# Patient Record
Sex: Male | Born: 1935
Health system: Southern US, Community
[De-identification: ages and names within clinical notes are randomized; demographics above are authoritative.]

## PROBLEM LIST (undated history)

## (undated) DIAGNOSIS — C259 Malignant neoplasm of pancreas, unspecified: Secondary | ICD-10-CM

## (undated) DIAGNOSIS — M109 Gout, unspecified: Secondary | ICD-10-CM

## (undated) DIAGNOSIS — M9080 Osteopathy in diseases classified elsewhere, unspecified site: Secondary | ICD-10-CM

## (undated) DIAGNOSIS — I639 Cerebral infarction, unspecified: Secondary | ICD-10-CM

## (undated) DIAGNOSIS — N189 Chronic kidney disease, unspecified: Secondary | ICD-10-CM

## (undated) DIAGNOSIS — R600 Localized edema: Secondary | ICD-10-CM

## (undated) DIAGNOSIS — D649 Anemia, unspecified: Secondary | ICD-10-CM

## (undated) DIAGNOSIS — M898X9 Other specified disorders of bone, unspecified site: Secondary | ICD-10-CM

## (undated) DIAGNOSIS — Z9889 Other specified postprocedural states: Secondary | ICD-10-CM

## (undated) DIAGNOSIS — E039 Hypothyroidism, unspecified: Secondary | ICD-10-CM

## (undated) DIAGNOSIS — G8929 Other chronic pain: Secondary | ICD-10-CM

## (undated) DIAGNOSIS — R809 Proteinuria, unspecified: Secondary | ICD-10-CM

## (undated) DIAGNOSIS — E119 Type 2 diabetes mellitus without complications: Secondary | ICD-10-CM

## (undated) DIAGNOSIS — I10 Essential (primary) hypertension: Secondary | ICD-10-CM

## (undated) DIAGNOSIS — C801 Malignant (primary) neoplasm, unspecified: Secondary | ICD-10-CM

## (undated) DIAGNOSIS — M549 Dorsalgia, unspecified: Secondary | ICD-10-CM

## (undated) DIAGNOSIS — R112 Nausea with vomiting, unspecified: Secondary | ICD-10-CM

## (undated) DIAGNOSIS — E889 Metabolic disorder, unspecified: Secondary | ICD-10-CM

## (undated) DIAGNOSIS — M1990 Unspecified osteoarthritis, unspecified site: Secondary | ICD-10-CM

## (undated) HISTORY — DX: Metabolic disorder, unspecified: E88.9

## (undated) HISTORY — DX: Unspecified osteoarthritis, unspecified site: M19.90

## (undated) HISTORY — DX: Other specified disorders of bone, unspecified site: M89.8X9

## (undated) HISTORY — DX: Malignant (primary) neoplasm, unspecified: C80.1

## (undated) HISTORY — DX: Malignant neoplasm of pancreas, unspecified: C25.9

## (undated) HISTORY — DX: Anemia, unspecified: D64.9

## (undated) HISTORY — PX: CATARACT EXTRACTION: SUR2

## (undated) HISTORY — DX: Osteopathy in diseases classified elsewhere, unspecified site: M90.80

## (undated) HISTORY — DX: Localized edema: R60.0

## (undated) HISTORY — DX: Proteinuria, unspecified: R80.9

## (undated) HISTORY — DX: Type 2 diabetes mellitus without complications: E11.9

## (undated) HISTORY — PX: COLONOSCOPY W/ BIOPSIES AND POLYPECTOMY: SHX1376

## (undated) HISTORY — DX: Essential (primary) hypertension: I10

## (undated) HISTORY — DX: Chronic kidney disease, unspecified: N18.9

---

## 2005-10-05 ENCOUNTER — Ambulatory Visit (HOSPITAL_COMMUNITY): Admission: RE | Admit: 2005-10-05 | Discharge: 2005-10-05 | Payer: Self-pay | Admitting: Neurosurgery

## 2005-10-07 ENCOUNTER — Ambulatory Visit (HOSPITAL_COMMUNITY): Admission: RE | Admit: 2005-10-07 | Discharge: 2005-10-07 | Payer: Self-pay | Admitting: Neurosurgery

## 2014-03-28 ENCOUNTER — Other Ambulatory Visit: Payer: Self-pay | Admitting: *Deleted

## 2014-03-28 ENCOUNTER — Encounter: Payer: Self-pay | Admitting: Vascular Surgery

## 2014-03-28 DIAGNOSIS — Z01810 Encounter for preprocedural cardiovascular examination: Secondary | ICD-10-CM

## 2014-03-28 DIAGNOSIS — N184 Chronic kidney disease, stage 4 (severe): Secondary | ICD-10-CM

## 2014-04-01 ENCOUNTER — Encounter: Payer: Self-pay | Admitting: Vascular Surgery

## 2014-04-02 ENCOUNTER — Ambulatory Visit (INDEPENDENT_AMBULATORY_CARE_PROVIDER_SITE_OTHER)
Admission: RE | Admit: 2014-04-02 | Discharge: 2014-04-02 | Disposition: A | Discharge status: Home / Self Care | Payer: Medicare Other | Source: Ambulatory Visit | Attending: Vascular Surgery | Admitting: Vascular Surgery

## 2014-04-02 ENCOUNTER — Other Ambulatory Visit: Payer: Self-pay

## 2014-04-02 ENCOUNTER — Ambulatory Visit (HOSPITAL_COMMUNITY)
Admission: RE | Admit: 2014-04-02 | Discharge: 2014-04-02 | Disposition: A | Discharge status: Home / Self Care | Payer: Medicare Other | Source: Ambulatory Visit | Attending: Vascular Surgery | Admitting: Vascular Surgery

## 2014-04-02 ENCOUNTER — Encounter: Payer: Self-pay | Admitting: Vascular Surgery

## 2014-04-02 ENCOUNTER — Ambulatory Visit (INDEPENDENT_AMBULATORY_CARE_PROVIDER_SITE_OTHER): Payer: Medicare Other | Admitting: Vascular Surgery

## 2014-04-02 VITALS — BP 113/61 | HR 64 | Resp 14 | Ht 69.5 in | Wt 206.0 lb

## 2014-04-02 DIAGNOSIS — Z01810 Encounter for preprocedural cardiovascular examination: Secondary | ICD-10-CM | POA: Diagnosis not present

## 2014-04-02 DIAGNOSIS — N184 Chronic kidney disease, stage 4 (severe): Secondary | ICD-10-CM | POA: Diagnosis not present

## 2014-04-02 DIAGNOSIS — N183 Chronic kidney disease, stage 3 unspecified: Secondary | ICD-10-CM | POA: Insufficient documentation

## 2014-04-02 DIAGNOSIS — N186 End stage renal disease: Secondary | ICD-10-CM

## 2014-04-02 NOTE — Progress Notes (Signed)
Subjective:     Patient ID: Cameron Phelps, male   DOB: 1919-03-25, 80 y.o.   MRN: [id number]  HPI this 80 year old male was referred by Dr.Befacadu for evaluation for vascular access. Patient is not yet on hemodialysis. He has a history of type 2 diabetes mellitus. He is right-handed. He has had a previous right brain stroke 10 years ago which resulted in some left upper extremity weakness which is partially resolved.  Past Medical History  Diagnosis Date  . Diabetes mellitus without complication   . Hypertension   . Chronic kidney disease   . Anemia   . Metabolic bone disease   . Leg edema   . Proteinuria   . Cancer     Prostate  . Arthritis     gout    History  Substance Use Topics  . Smoking status: Never Smoker   . Smokeless tobacco: Never Used  . Alcohol Use: No    Family History  Problem Relation Age of Onset  . Diabetes Mother   . Cancer Daughter   . Diabetes Daughter   . Hyperlipidemia Daughter     No Known Allergies  Current outpatient prescriptions:allopurinol (ZYLOPRIM) 100 MG tablet, Take 100 mg by mouth daily., Disp: , Rfl: ;  Calcium Carbonate (CALTRATE 600 PO), Take by mouth., Disp: , Rfl: ;  clopidogrel (PLAVIX) 75 MG tablet, Take 75 mg by mouth daily., Disp: , Rfl: ;  glipiZIDE (GLUCOTROL) 5 MG tablet, Take by mouth daily before breakfast., Disp: , Rfl:  levothyroxine (SYNTHROID, LEVOTHROID) 88 MCG tablet, Take 88 mcg by mouth daily before breakfast., Disp: , Rfl: ;  Omega-3 Fatty Acids (FISH OIL) 1000 MG CAPS, Take by mouth., Disp: , Rfl: ;  oxyCODONE-acetaminophen (PERCOCET/ROXICET) 5-325 MG per tablet, Take 1 tablet by mouth every 6 (six) hours as needed for severe pain., Disp: , Rfl: ;  pioglitazone (ACTOS) 30 MG tablet, Take 30 mg by mouth daily., Disp: , Rfl:  predniSONE (DELTASONE) 10 MG tablet, Take 10 mg by mouth daily with breakfast., Disp: , Rfl: ;  torsemide (DEMADEX) 10 MG tablet, Take 10 mg by mouth daily., Disp: , Rfl:   BP 113/61  Pulse 64   Resp 14  Ht 5' 9.5" (1.765 m)  Wt 206 lb (93.441 kg)  BMI 29.99 kg/m2  Body mass index is 29.99 kg/(m^2).          Review of Systems patient is able to ambulate. Complains of leg pain with walking and leg pain lying flat. Also has weakness in arms and legs. Previous right brain stroke with left upper extremity weakness. Has lower extremity edema. Uncontrolled type 2 diabetes mellitus. Other systems negative and a complete review of systems other than severe back pain    Objective:   Physical Exam BP 113/61  Pulse 64  Resp 14  Ht 5' 9.5" (1.765 m)  Wt 206 lb (93.441 kg)  BMI 29.99 kg/m2  Gen.-alert and oriented x3 in no apparent distress-elderly HEENT normal for age Lungs no rhonchi or wheezing Cardiovascular regular rhythm no murmurs carotid pulses 3+ palpable no bruits audible Abdomen soft nontender no palpable masses Musculoskeletal free of  major deformities Skin clear -no rashes Neurologic mild left upper extremity weakness. Able to lift against gravity. Has a weak grip. Lower extremities 3+ femoral and dorsalis pedis pulses palpable bilaterally with no edema 2-3+ radial pulses palpable bilaterally. No obvious surface veins noted.  Data ordered bilateral upper chin a vein mapping and arterial study. The arterial study was  normal in both upper extremities. No veins are suitable for fistula creation in either upper extremity particularly the left with small caliber and thickened veins throughout.        Assessment:     Patient with end-stage renal disease not yet on hemodialysis needs vascular access History of right brain stroke with left upper extremity paralysis Type 2 diabetes mellitus    Plan:     Plan left upper arm AV graft insertion by Dr. Bridgett Larsson on Monday, September 28. Discussed thoroughly with patient and his daughter and they are agreeable to proceed

## 2014-04-05 ENCOUNTER — Encounter (HOSPITAL_COMMUNITY): Payer: Self-pay

## 2014-04-05 ENCOUNTER — Other Ambulatory Visit (HOSPITAL_COMMUNITY): Payer: Medicare Other

## 2014-04-05 ENCOUNTER — Encounter (HOSPITAL_COMMUNITY): Payer: Self-pay | Admitting: *Deleted

## 2014-04-07 MED ORDER — DEXTROSE 5 % IV SOLN
1.5000 g | INTRAVENOUS | Status: AC
  Administered 2014-04-08: 1.5 g via INTRAVENOUS
  Filled 2014-04-07 (×2): qty 1.5

## 2014-04-08 ENCOUNTER — Ambulatory Visit (HOSPITAL_COMMUNITY): Payer: Medicare Other

## 2014-04-08 ENCOUNTER — Encounter (HOSPITAL_COMMUNITY): Payer: Medicare Other | Admitting: Anesthesiology

## 2014-04-08 ENCOUNTER — Ambulatory Visit (HOSPITAL_COMMUNITY): Payer: Medicare Other | Admitting: Anesthesiology

## 2014-04-08 ENCOUNTER — Encounter (HOSPITAL_COMMUNITY)
Admission: RE | Disposition: A | Discharge status: Home / Self Care | Payer: Self-pay | Source: Ambulatory Visit | Attending: Vascular Surgery

## 2014-04-08 ENCOUNTER — Encounter (HOSPITAL_COMMUNITY): Payer: Self-pay | Admitting: Certified Registered"

## 2014-04-08 ENCOUNTER — Ambulatory Visit (HOSPITAL_COMMUNITY)
Admission: RE | Admit: 2014-04-08 | Discharge: 2014-04-08 | Disposition: A | Discharge status: Home / Self Care | Payer: Medicare Other | Source: Ambulatory Visit | Attending: Vascular Surgery | Admitting: Vascular Surgery

## 2014-04-08 DIAGNOSIS — IMO0002 Reserved for concepts with insufficient information to code with codable children: Secondary | ICD-10-CM | POA: Diagnosis not present

## 2014-04-08 DIAGNOSIS — N184 Chronic kidney disease, stage 4 (severe): Secondary | ICD-10-CM

## 2014-04-08 DIAGNOSIS — M109 Gout, unspecified: Secondary | ICD-10-CM | POA: Insufficient documentation

## 2014-04-08 DIAGNOSIS — R29898 Other symptoms and signs involving the musculoskeletal system: Secondary | ICD-10-CM | POA: Diagnosis not present

## 2014-04-08 DIAGNOSIS — Z8546 Personal history of malignant neoplasm of prostate: Secondary | ICD-10-CM | POA: Diagnosis not present

## 2014-04-08 DIAGNOSIS — E1165 Type 2 diabetes mellitus with hyperglycemia: Secondary | ICD-10-CM | POA: Insufficient documentation

## 2014-04-08 DIAGNOSIS — Z79899 Other long term (current) drug therapy: Secondary | ICD-10-CM | POA: Diagnosis not present

## 2014-04-08 DIAGNOSIS — E118 Type 2 diabetes mellitus with unspecified complications: Secondary | ICD-10-CM

## 2014-04-08 DIAGNOSIS — I129 Hypertensive chronic kidney disease with stage 1 through stage 4 chronic kidney disease, or unspecified chronic kidney disease: Secondary | ICD-10-CM | POA: Diagnosis not present

## 2014-04-08 DIAGNOSIS — I69998 Other sequelae following unspecified cerebrovascular disease: Secondary | ICD-10-CM | POA: Diagnosis not present

## 2014-04-08 DIAGNOSIS — D649 Anemia, unspecified: Secondary | ICD-10-CM | POA: Diagnosis not present

## 2014-04-08 DIAGNOSIS — N186 End stage renal disease: Secondary | ICD-10-CM

## 2014-04-08 HISTORY — DX: Hypothyroidism, unspecified: E03.9

## 2014-04-08 HISTORY — DX: Cerebral infarction, unspecified: I63.9

## 2014-04-08 HISTORY — DX: Gout, unspecified: M10.9

## 2014-04-08 HISTORY — DX: Other chronic pain: G89.29

## 2014-04-08 HISTORY — DX: Dorsalgia, unspecified: M54.9

## 2014-04-08 HISTORY — PX: AV FISTULA PLACEMENT: SHX1204

## 2014-04-08 LAB — GLUCOSE, CAPILLARY: Glucose-Capillary: 248 mg/dL — ABNORMAL HIGH (ref 70–99)

## 2014-04-08 LAB — POCT I-STAT 4, (NA,K, GLUC, HGB,HCT)
GLUCOSE: 272 mg/dL — AB (ref 70–99)
HCT: 34 % — ABNORMAL LOW (ref 39.0–52.0)
Hemoglobin: 11.6 g/dL — ABNORMAL LOW (ref 13.0–17.0)
Potassium: 4.1 mEq/L (ref 3.7–5.3)
Sodium: 141 mEq/L (ref 137–147)

## 2014-04-08 SURGERY — ARTERIOVENOUS (AV) FISTULA CREATION
Anesthesia: Monitor Anesthesia Care | Site: Arm Upper | Laterality: Left

## 2014-04-08 MED ORDER — PROPOFOL 10 MG/ML IV BOLUS
INTRAVENOUS | Status: AC
  Filled 2014-04-08: qty 20

## 2014-04-08 MED ORDER — THROMBIN 20000 UNITS EX SOLR
CUTANEOUS | Status: AC
  Filled 2014-04-08: qty 20000

## 2014-04-08 MED ORDER — SODIUM CHLORIDE 0.9 % IV SOLN: INTRAVENOUS | Status: DC

## 2014-04-08 MED ORDER — FENTANYL CITRATE 0.05 MG/ML IJ SOLN: 25.0000 ug | INTRAMUSCULAR | Status: DC | PRN

## 2014-04-08 MED ORDER — OXYCODONE HCL 5 MG PO TABS: 5.0000 mg | ORAL_TABLET | ORAL | Status: DC | PRN

## 2014-04-08 MED ORDER — FENTANYL CITRATE 0.05 MG/ML IJ SOLN
INTRAMUSCULAR | Status: AC
  Filled 2014-04-08: qty 5

## 2014-04-08 MED ORDER — LIDOCAINE-EPINEPHRINE (PF) 1 %-1:200000 IJ SOLN
INTRAMUSCULAR | Status: DC | PRN
  Administered 2014-04-08: 30 mL

## 2014-04-08 MED ORDER — OXYCODONE HCL 5 MG PO TABS
ORAL_TABLET | ORAL | Status: AC
  Administered 2014-04-08: 5 mg via ORAL
  Filled 2014-04-08: qty 1

## 2014-04-08 MED ORDER — LIDOCAINE-EPINEPHRINE (PF) 1 %-1:200000 IJ SOLN
INTRAMUSCULAR | Status: AC
  Filled 2014-04-08: qty 10

## 2014-04-08 MED ORDER — 0.9 % SODIUM CHLORIDE (POUR BTL) OPTIME
TOPICAL | Status: DC | PRN
  Administered 2014-04-08: 1000 mL

## 2014-04-08 MED ORDER — PROPOFOL 10 MG/ML IV BOLUS
INTRAVENOUS | Status: DC | PRN
  Administered 2014-04-08: 30 mg via INTRAVENOUS

## 2014-04-08 MED ORDER — PROPOFOL INFUSION 10 MG/ML OPTIME
INTRAVENOUS | Status: DC | PRN
  Administered 2014-04-08: 50 ug/kg/min via INTRAVENOUS

## 2014-04-08 MED ORDER — FENTANYL CITRATE 0.05 MG/ML IJ SOLN
INTRAMUSCULAR | Status: DC | PRN
  Administered 2014-04-08: 50 ug via INTRAVENOUS

## 2014-04-08 MED ORDER — SODIUM CHLORIDE 0.9 % IV SOLN
INTRAVENOUS | Status: DC | PRN
  Administered 2014-04-08: 07:00:00 via INTRAVENOUS

## 2014-04-08 MED ORDER — OXYCODONE HCL 5 MG PO TABS
5.0000 mg | ORAL_TABLET | Freq: Once | ORAL | Status: AC
  Administered 2014-04-08: 5 mg via ORAL

## 2014-04-08 MED ORDER — CHLORHEXIDINE GLUCONATE CLOTH 2 % EX PADS: 6.0000 | MEDICATED_PAD | Freq: Once | CUTANEOUS | Status: DC

## 2014-04-08 MED ORDER — LIDOCAINE HCL (CARDIAC) 20 MG/ML IV SOLN
INTRAVENOUS | Status: DC | PRN
  Administered 2014-04-08: 40 mg via INTRAVENOUS

## 2014-04-08 MED ORDER — PHENYLEPHRINE 40 MCG/ML (10ML) SYRINGE FOR IV PUSH (FOR BLOOD PRESSURE SUPPORT)
PREFILLED_SYRINGE | INTRAVENOUS | Status: AC
  Filled 2014-04-08: qty 10

## 2014-04-08 MED ORDER — LIDOCAINE HCL (CARDIAC) 20 MG/ML IV SOLN
INTRAVENOUS | Status: AC
  Filled 2014-04-08: qty 5

## 2014-04-08 MED ORDER — PHENYLEPHRINE HCL 10 MG/ML IJ SOLN
INTRAMUSCULAR | Status: DC | PRN
  Administered 2014-04-08: 80 ug via INTRAVENOUS
  Administered 2014-04-08: 120 ug via INTRAVENOUS

## 2014-04-08 MED ORDER — ONDANSETRON HCL 4 MG/2ML IJ SOLN
INTRAMUSCULAR | Status: DC | PRN
  Administered 2014-04-08: 4 mg via INTRAVENOUS

## 2014-04-08 MED ORDER — SODIUM CHLORIDE 0.9 % IR SOLN
Status: DC | PRN
  Administered 2014-04-08: 08:00:00

## 2014-04-08 MED ORDER — ONDANSETRON HCL 4 MG/2ML IJ SOLN
INTRAMUSCULAR | Status: AC
  Filled 2014-04-08: qty 2

## 2014-04-08 MED ORDER — EPHEDRINE SULFATE 50 MG/ML IJ SOLN
INTRAMUSCULAR | Status: AC
  Filled 2014-04-08: qty 1

## 2014-04-08 MED ORDER — SODIUM CHLORIDE 0.9 % IJ SOLN
INTRAMUSCULAR | Status: AC
  Filled 2014-04-08: qty 10

## 2014-04-08 MED ORDER — BUPIVACAINE HCL (PF) 0.5 % IJ SOLN
INTRAMUSCULAR | Status: DC | PRN
  Administered 2014-04-08: 30 mL

## 2014-04-08 MED ORDER — EPHEDRINE SULFATE 50 MG/ML IJ SOLN
INTRAMUSCULAR | Status: DC | PRN
  Administered 2014-04-08 (×2): 10 mg via INTRAVENOUS

## 2014-04-08 SURGICAL SUPPLY — 39 items
ADH SKN CLS APL DERMABOND .7 (GAUZE/BANDAGES/DRESSINGS) ×2
ARMBAND PINK RESTRICT EXTREMIT (MISCELLANEOUS) ×3 IMPLANT
BLADE SURG 10 STRL SS (BLADE) ×2 IMPLANT
CANISTER SUCTION 2500CC (MISCELLANEOUS) ×3 IMPLANT
CLIP TI MEDIUM 6 (CLIP) ×3 IMPLANT
CLIP TI WIDE RED SMALL 6 (CLIP) ×3 IMPLANT
COVER PROBE W GEL 5X96 (DRAPES) ×2 IMPLANT
DECANTER SPIKE VIAL GLASS SM (MISCELLANEOUS) ×3 IMPLANT
DERMABOND ADVANCED (GAUZE/BANDAGES/DRESSINGS) ×1
DERMABOND ADVANCED .7 DNX12 (GAUZE/BANDAGES/DRESSINGS) ×2 IMPLANT
ELECT REM PT RETURN 9FT ADLT (ELECTROSURGICAL) ×3
ELECTRODE REM PT RTRN 9FT ADLT (ELECTROSURGICAL) ×2 IMPLANT
GLOVE BIO SURGEON STRL SZ7 (GLOVE) ×3 IMPLANT
GLOVE BIOGEL PI IND STRL 7.0 (GLOVE) ×1 IMPLANT
GLOVE BIOGEL PI IND STRL 7.5 (GLOVE) ×2 IMPLANT
GLOVE BIOGEL PI INDICATOR 7.0 (GLOVE) ×1
GLOVE BIOGEL PI INDICATOR 7.5 (GLOVE) ×1
GLOVE ECLIPSE 6.5 STRL STRAW (GLOVE) ×2 IMPLANT
GLOVE SURG SS PI 6.5 STRL IVOR (GLOVE) ×2 IMPLANT
GOWN STRL REUS W/ TWL LRG LVL3 (GOWN DISPOSABLE) ×5 IMPLANT
GOWN STRL REUS W/ TWL XL LVL3 (GOWN DISPOSABLE) ×1 IMPLANT
GOWN STRL REUS W/TWL LRG LVL3 (GOWN DISPOSABLE) ×6
GOWN STRL REUS W/TWL XL LVL3 (GOWN DISPOSABLE) ×3
KIT BASIN OR (CUSTOM PROCEDURE TRAY) ×3 IMPLANT
KIT ROOM TURNOVER OR (KITS) ×3 IMPLANT
NS IRRIG 1000ML POUR BTL (IV SOLUTION) ×3 IMPLANT
PACK CV ACCESS (CUSTOM PROCEDURE TRAY) ×3 IMPLANT
PAD ARMBOARD 7.5X6 YLW CONV (MISCELLANEOUS) ×6 IMPLANT
PROBE PENCIL 8 MHZ STRL DISP (MISCELLANEOUS) ×2 IMPLANT
SPONGE SURGIFOAM ABS GEL 100 (HEMOSTASIS) IMPLANT
SUT MNCRL AB 4-0 PS2 18 (SUTURE) ×3 IMPLANT
SUT PROLENE 5 0 C 1 24 (SUTURE) IMPLANT
SUT PROLENE 6 0 BV (SUTURE) ×4 IMPLANT
SUT PROLENE 7 0 BV 1 (SUTURE) ×2 IMPLANT
SUT SILK 2 0 FS (SUTURE) ×1 IMPLANT
SUT VIC AB 3-0 SH 27 (SUTURE) ×6
SUT VIC AB 3-0 SH 27X BRD (SUTURE) ×4 IMPLANT
UNDERPAD 30X30 INCONTINENT (UNDERPADS AND DIAPERS) ×3 IMPLANT
WATER STERILE IRR 1000ML POUR (IV SOLUTION) ×3 IMPLANT

## 2014-04-08 NOTE — Discharge Instructions (Addendum)

## 2014-04-08 NOTE — Transfer of Care (Signed)
Immediate Anesthesia Transfer of Care Note  Patient: Cameron Phelps  Procedure(s) Performed: Procedure(s): ARTERIOVENOUS (AV) FISTULA CREATION (Left)  Patient Location: PACU  Anesthesia Type:MAC  Level of Consciousness: awake, alert  and oriented  Airway & Oxygen Therapy: Patient Spontanous Breathing  Post-op Assessment: Report given to PACU RN  Post vital signs: Reviewed and stable  Complications: No apparent anesthesia complications

## 2014-04-08 NOTE — Anesthesia Postprocedure Evaluation (Signed)
  Anesthesia Post-op Note  Patient: Cameron Phelps  Procedure(s) Performed: Procedure(s): ARTERIOVENOUS (AV) FISTULA CREATION (Left)  Patient Location: PACU  Anesthesia Type: MAC  Level of Consciousness: awake and alert   Airway and Oxygen Therapy: Patient Spontanous Breathing  Post-op Pain: none  Post-op Assessment: Post-op Vital signs reviewed, Patient's Cardiovascular Status Stable and Respiratory Function Stable  Post-op Vital Signs: Reviewed  Filed Vitals:   04/08/14 0939  BP: 122/47  Pulse: 72  Temp:   Resp: 15    Complications: No apparent anesthesia complications

## 2014-04-08 NOTE — Op Note (Signed)
OPERATIVE NOTE   PROCEDURE: 1. left first stage basilic vein transposition (brachiobasilic arteriovenous fistula) placement  PRE-OPERATIVE DIAGNOSIS: chronic kidney disease stage IV-V   POST-OPERATIVE DIAGNOSIS: same as above   SURGEON: Adele Barthel, MD  ANESTHESIA: local and MAC  ESTIMATED BLOOD LOSS: 50 cc  FINDING(S): 1. Palpable thrill and radial pulse at end of case 2. 4 mm cubital and 4 mm brachial veins  SPECIMEN(S):  none  INDICATIONS:   Cameron Phelps is a 80 y.o. male who presents with imminent end stage renal disease.  The patient is scheduled for left arm arteriovenous graft but I discussed possibly placing a staged fistula if his veins were found to be adequate.  The patient is aware the risks include but are not limited to: bleeding, infection, steal syndrome, nerve damage, ischemic monomelic neuropathy, failure to mature, and need for additional procedures.  The patient is aware of the risks of the procedure and elects to proceed forward.  DESCRIPTION: After full informed written consent was obtained from the patient, the patient was brought back to the operating room and placed supine upon the operating table.  Prior to induction, the patient received IV antibiotics.   After obtaining adequate anesthesia, the patient was then prepped and draped in the standard fashion for a left arm access procedure.  I interrogated the left arm with the sonosite and an acceptable basilic vein was found.  I felt an attempt at basilic vein transposition was indicated.  I turned my attention first to identifying the patient's basilic vein and brachial artery.  Using SonoSite guidance, the location of these vessels were marked out on the skin.   At this point, I injected local anesthetic to obtain a field block of the antecubitum.  In total, I injected about 10 mL of a 1:1 mixture of 0.5% Marcaine without epinephrine and 1% lidocaine with epinephrine.  I made a longitudinal incision at the  level of the antecubitum and dissected through the subcutaneous tissue and fascia to gain exposure of the brachial artery.  This was noted to be 4 mm in diameter externally.  This was dissected out proximally and distally and controlled with vessel loops .  In the process of the dissecting out the brachial artery, I also found a cubital vein draining into the basilic vein system.  This was noted to be 4 mm in diameter externally.  The distal segment of the vein was ligated with a  2-0 silk, and the vein was transected.  The proximal segment was iinterrogated with serial dilators.  The vein accepted up to a 5 mm dilator without any difficulty.  I then instilled the heparinized saline into the vein and clamped it.  At this point, I reset my exposure of the brachial artery and placed the artery under tension proximally and distally.  I made an arteriotomy with a #11 blade, and then I extended the arteriotomy with a Potts scissor.  I injected heparinized saline proximal and distal to this arteriotomy.  The vein was then sewn to the artery in an end-to-side configuration with a running stitch of 7-0 Prolene.  Prior to completing this anastomosis, I allowed the vein and artery to backbleed.  There was no evidence of clot from any vessels.  I completed the anastomosis in the usual fashion and then released all vessel loops and clamps.  There was a palpable  thrill in the venous outflow, and there was a palpable radial pulse.  At this point, I irrigated out  the surgical wound.  There was no further active bleeding.  The subcutaneous tissue was reapproximated with a running stitch of 3-0 Vicryl.  The skin was then reapproximated with a running subcuticular stitch of 4-0 Vicryl.  The skin was then cleaned, dried, and reinforced with Dermabond.  The patient tolerated this procedure well.   COMPLICATIONS: none  CONDITION: stable  Adele Barthel, MD [facility] Office: [phone] Pager:  [phone]  04/08/2014, 9:01 AM

## 2014-04-08 NOTE — Interval H&P Note (Signed)
History and Physical Interval Note:  04/08/2014 7:26 AM  Cameron Phelps  has presented today for surgery, with the diagnosis of End stage renal disease  The various methods of treatment have been discussed with the patient and family. After consideration of risks, benefits and other options for treatment, the patient has consented to  Procedure(s): INSERTION OF ARTERIOVENOUS (AV) GORE-TEX GRAFT ARM (Left) as a surgical intervention .  The patient's history has been reviewed, patient examined, no change in status, stable for surgery.  I have reviewed the patient's chart and labs.  Questions were answered to the patient's satisfaction.     Ayslin Kundert LIANG-YU

## 2014-04-08 NOTE — Anesthesia Preprocedure Evaluation (Addendum)
Anesthesia Evaluation  Patient identified by MRN, date of birth, ID band Patient awake    Reviewed: Allergy & Precautions, H&P , NPO status , Patient's Chart, lab work & pertinent test results  Airway Mallampati: II TM Distance: >3 FB Neck ROM: Full    Dental no notable dental hx. (+) Upper Dentures, Lower Dentures, Dental Advisory Given   Pulmonary neg pulmonary ROS,  breath sounds clear to auscultation  Pulmonary exam normal       Cardiovascular hypertension, Rhythm:Regular Rate:Normal     Neuro/Psych CVA negative neurological ROS  negative psych ROS   GI/Hepatic negative GI ROS, Neg liver ROS,   Endo/Other  diabetes, Type 2, Oral Hypoglycemic AgentsHypothyroidism   Renal/GU ESRFRenal disease  negative genitourinary   Musculoskeletal   Abdominal   Peds  Hematology negative hematology ROS (+) anemia ,   Anesthesia Other Findings   Reproductive/Obstetrics negative OB ROS                          Anesthesia Physical Anesthesia Plan  ASA: III  Anesthesia Plan: MAC   Post-op Pain Management:    Induction: Intravenous  Airway Management Planned: Simple Face Mask  Additional Equipment:   Intra-op Plan:   Post-operative Plan:   Informed Consent: I have reviewed the patients History and Physical, chart, labs and discussed the procedure including the risks, benefits and alternatives for the proposed anesthesia with the patient or authorized representative who has indicated his/her understanding and acceptance.   Dental advisory given  Plan Discussed with: CRNA  Anesthesia Plan Comments:         Anesthesia Quick Evaluation

## 2014-04-08 NOTE — H&P (View-Only) (Signed)
Subjective:     Patient ID: Cameron Phelps, male   DOB: 1919-02-06, 80 y.o.   MRN: [id number]  HPI this 80 year old male was referred by Dr.Befacadu for evaluation for vascular access. Patient is not yet on hemodialysis. He has a history of type 2 diabetes mellitus. He is right-handed. He has had a previous right brain stroke 10 years ago which resulted in some left upper extremity weakness which is partially resolved.  Past Medical History  Diagnosis Date  . Diabetes mellitus without complication   . Hypertension   . Chronic kidney disease   . Anemia   . Metabolic bone disease   . Leg edema   . Proteinuria   . Cancer     Prostate  . Arthritis     gout    History  Substance Use Topics  . Smoking status: Never Smoker   . Smokeless tobacco: Never Used  . Alcohol Use: No    Family History  Problem Relation Age of Onset  . Diabetes Mother   . Cancer Daughter   . Diabetes Daughter   . Hyperlipidemia Daughter     No Known Allergies  Current outpatient prescriptions:allopurinol (ZYLOPRIM) 100 MG tablet, Take 100 mg by mouth daily., Disp: , Rfl: ;  Calcium Carbonate (CALTRATE 600 PO), Take by mouth., Disp: , Rfl: ;  clopidogrel (PLAVIX) 75 MG tablet, Take 75 mg by mouth daily., Disp: , Rfl: ;  glipiZIDE (GLUCOTROL) 5 MG tablet, Take by mouth daily before breakfast., Disp: , Rfl:  levothyroxine (SYNTHROID, LEVOTHROID) 88 MCG tablet, Take 88 mcg by mouth daily before breakfast., Disp: , Rfl: ;  Omega-3 Fatty Acids (FISH OIL) 1000 MG CAPS, Take by mouth., Disp: , Rfl: ;  oxyCODONE-acetaminophen (PERCOCET/ROXICET) 5-325 MG per tablet, Take 1 tablet by mouth every 6 (six) hours as needed for severe pain., Disp: , Rfl: ;  pioglitazone (ACTOS) 30 MG tablet, Take 30 mg by mouth daily., Disp: , Rfl:  predniSONE (DELTASONE) 10 MG tablet, Take 10 mg by mouth daily with breakfast., Disp: , Rfl: ;  torsemide (DEMADEX) 10 MG tablet, Take 10 mg by mouth daily., Disp: , Rfl:   BP 113/61  Pulse 64   Resp 14  Ht 5' 9.5" (1.765 m)  Wt 206 lb (93.441 kg)  BMI 29.99 kg/m2  Body mass index is 29.99 kg/(m^2).          Review of Systems patient is able to ambulate. Complains of leg pain with walking and leg pain lying flat. Also has weakness in arms and legs. Previous right brain stroke with left upper extremity weakness. Has lower extremity edema. Uncontrolled type 2 diabetes mellitus. Other systems negative and a complete review of systems other than severe back pain    Objective:   Physical Exam BP 113/61  Pulse 64  Resp 14  Ht 5' 9.5" (1.765 m)  Wt 206 lb (93.441 kg)  BMI 29.99 kg/m2  Gen.-alert and oriented x3 in no apparent distress-elderly HEENT normal for age Lungs no rhonchi or wheezing Cardiovascular regular rhythm no murmurs carotid pulses 3+ palpable no bruits audible Abdomen soft nontender no palpable masses Musculoskeletal free of  major deformities Skin clear -no rashes Neurologic mild left upper extremity weakness. Able to lift against gravity. Has a weak grip. Lower extremities 3+ femoral and dorsalis pedis pulses palpable bilaterally with no edema 2-3+ radial pulses palpable bilaterally. No obvious surface veins noted.  Data ordered bilateral upper chin a vein mapping and arterial study. The arterial study was  normal in both upper extremities. No veins are suitable for fistula creation in either upper extremity particularly the left with small caliber and thickened veins throughout.        Assessment:     Patient with end-stage renal disease not yet on hemodialysis needs vascular access History of right brain stroke with left upper extremity paralysis Type 2 diabetes mellitus    Plan:     Plan left upper arm AV graft insertion by Dr. Bridgett Larsson on Monday, September 28. Discussed thoroughly with patient and his daughter and they are agreeable to proceed

## 2014-04-08 NOTE — Anesthesia Procedure Notes (Signed)
Procedure Name: MAC Date/Time: 04/08/2014 7:41 AM Performed by: Barrington Ellison Pre-anesthesia Checklist: Patient identified, Emergency Drugs available, Suction available and Patient being monitored Patient Re-evaluated:Patient Re-evaluated prior to inductionOxygen Delivery Method: Simple face mask

## 2014-04-09 ENCOUNTER — Telehealth: Payer: Self-pay | Admitting: Vascular Surgery

## 2014-04-09 NOTE — Telephone Encounter (Signed)
Gave daughter Shauna Hugh the follow up appointment information: 05/24/14 at 3:30pm - kf

## 2014-04-10 ENCOUNTER — Telehealth: Payer: Self-pay | Admitting: *Deleted

## 2014-04-10 ENCOUNTER — Encounter (HOSPITAL_COMMUNITY): Payer: Self-pay | Admitting: Vascular Surgery

## 2014-04-10 DIAGNOSIS — Z9889 Other specified postprocedural states: Principal | ICD-10-CM

## 2014-04-10 DIAGNOSIS — R110 Nausea: Secondary | ICD-10-CM

## 2014-04-10 MED ORDER — PROMETHAZINE HCL 12.5 MG RE SUPP: 6.2500 mg | Freq: Four times a day (QID) | RECTAL | Status: DC | PRN

## 2014-04-10 NOTE — Telephone Encounter (Signed)
Daughter called to report that Mr. Dolton is having problems with extreme nausea,no vomiting since his Left arm BVT on 04-08-14 by Dr. Bridgett Larsson. He is having nausea when taking any of his meds and when he smells food. He is drinking fluids like water and cranberrry juice with no difficulty. I asked our PA, Leontine Locket, and she has rx'd Phenergan suppositiories 6.25 mg #12. I called this in to [facility]. I cautioned the daughter on watching out for sleepiness and breathing suppression issues. She will watch Mr. Trucks closely and report back to [place] if he doesn't improve. She has been in contact with his PCP, Dr. Woody Seller, regarding patient's constipation issue;  He has had chronic constipation problems.

## 2014-05-23 ENCOUNTER — Encounter: Payer: Self-pay | Admitting: Vascular Surgery

## 2014-05-24 ENCOUNTER — Ambulatory Visit (INDEPENDENT_AMBULATORY_CARE_PROVIDER_SITE_OTHER): Payer: Medicare Other | Admitting: Vascular Surgery

## 2014-05-24 ENCOUNTER — Encounter: Payer: Medicare Other | Admitting: Vascular Surgery

## 2014-05-24 ENCOUNTER — Encounter: Payer: Self-pay | Admitting: Vascular Surgery

## 2014-05-24 VITALS — BP 127/66 | HR 86 | Temp 98.2°F | Resp 16 | Ht 69.5 in | Wt 217.0 lb

## 2014-05-24 DIAGNOSIS — N183 Chronic kidney disease, stage 3 unspecified: Secondary | ICD-10-CM

## 2014-05-24 NOTE — Progress Notes (Signed)
    Postoperative Access Visit   History of Present Illness  Cameron Phelps is a 80 y.o. year old male who presents for postoperative follow-up for: L 1st stage BVT (Date: 04/08/14).  The patient's wounds are healed.  The patient notes no steal symptoms.  The patient is able to complete their activities of daily living.  The patient's current symptoms are: none.  Per the patient, his kidney function is improving and he likely will not need dialysis.  For VQI Use Only  PRE-ADM LIVING: Home  AMB STATUS: Ambulatory  Physical Examination Filed Vitals:   05/24/14 1547  BP: 127/66  Pulse: 86  Temp: 98.2 F (36.8 C)  Resp: 16    LUE: Incision is healed, skin feels warm, hand grip is 5/5, sensation in digits is intact, palpable thrill, bruit can be auscultated , on Sonosite: BVT > 6 mm throughout, palpable radial pulse  Medical Decision Making  Cameron Phelps is a 80 y.o. year old male who presents s/p successful L first stage BVT.  The L BVT is ready for transposition but fortunately it appears that he might not need it.  A 4 week window generally would be necessary to finish the transposition, so if his renal function worsens, I would arrange for the second stage.  Thank you for allowing [place] to participate in this patient's care.  Adele Barthel, MD [facility] [place] Office: [phone] Pager: [phone]  05/24/2014, 4:38 PM

## 2014-07-22 ENCOUNTER — Encounter: Payer: Self-pay | Admitting: Vascular Surgery

## 2014-07-22 ENCOUNTER — Ambulatory Visit: Payer: Medicare Other | Admitting: Surgery

## 2014-07-23 ENCOUNTER — Encounter: Payer: Self-pay | Admitting: Vascular Surgery

## 2014-07-23 ENCOUNTER — Ambulatory Visit (INDEPENDENT_AMBULATORY_CARE_PROVIDER_SITE_OTHER): Payer: Medicare Other | Admitting: Vascular Surgery

## 2014-07-23 ENCOUNTER — Other Ambulatory Visit: Payer: Self-pay

## 2014-07-23 VITALS — BP 117/50 | HR 84 | Resp 18 | Ht 69.0 in | Wt 202.0 lb

## 2014-07-23 DIAGNOSIS — N183 Chronic kidney disease, stage 3 unspecified: Secondary | ICD-10-CM

## 2014-07-23 NOTE — Progress Notes (Signed)
HISTORY AND PHYSICAL   History of Present Illness:  Patient is a 81 y.o. year old male who presents for evaluation of left AV fistula.  L 1st stage BVT (Date: 04/08/14). The patient's wounds are healed. The patient notes no steal symptoms. The patient is able to complete their activities of daily living.  He was last seen by Dr. Bridgett Larsson on 05/24/2014.  At that visit he was not on dialysis nor did they think he would be soon.  He returns today with a report from Dr. Lowanda Foster that he will need dialysis sooner than later.   Other medical problems include has CKD (chronic kidney disease), stage III on his problem list.  Past Medical History  Diagnosis Date  . Diabetes mellitus without complication   . Chronic kidney disease   . Anemia   . Metabolic bone disease   . Leg edema   . Proteinuria   . Cancer     Prostate  . Arthritis     gout  . Stroke     residual left -sided weakness  . Hypothyroidism   . Gout   . Chronic back pain   . Hypertension     Denies history of HTN    Past Surgical History  Procedure Laterality Date  . Colonoscopy w/ biopsies and polypectomy    . Cataract extraction    . Av fistula placement Left 04/08/2014    Procedure: ARTERIOVENOUS (AV) FISTULA CREATION;  Surgeon: Conrad Gregory, MD;  Location: [facility];  Service: Vascular;  Laterality: Left;    Social History History  Substance Use Topics  . Smoking status: Never Smoker   . Smokeless tobacco: Never Used  . Alcohol Use: No    Family History Family History  Problem Relation Age of Onset  . Diabetes Mother   . Cancer Daughter   . Diabetes Daughter   . Hyperlipidemia Daughter     Allergies  No Known Allergies   Current Outpatient Prescriptions  Medication Sig Dispense Refill  . Calcium Carbonate (CALTRATE 600 PO) Take 600 mg by mouth daily.     . calcium carbonate (OS-CAL) 600 MG TABS tablet Take 600 mg by mouth 2 (two) times daily with a meal.    . clopidogrel (PLAVIX) 75 MG tablet Take 75  mg by mouth daily.    Marland Kitchen glipiZIDE (GLUCOTROL) 5 MG tablet Take 5 mg by mouth 2 (two) times daily.     Marland Kitchen levothyroxine (SYNTHROID, LEVOTHROID) 88 MCG tablet Take 88 mcg by mouth daily before breakfast.    . pioglitazone (ACTOS) 30 MG tablet Take 30 mg by mouth daily.    . polyethylene glycol (MIRALAX / GLYCOLAX) packet Take 17 g by mouth daily.    . sodium bicarbonate 650 MG tablet Take 650 mg by mouth 3 (three) times daily.    Marland Kitchen torsemide (DEMADEX) 10 MG tablet Take 10 mg by mouth daily.    Marland Kitchen allopurinol (ZYLOPRIM) 100 MG tablet Take 100 mg by mouth daily.    . Omega-3 Fatty Acids (FISH OIL) 1000 MG CAPS Take 1,000 mg by mouth daily.     Marland Kitchen oxyCODONE (ROXICODONE) 5 MG immediate release tablet Take 1 tablet (5 mg total) by mouth every 4 (four) hours as needed for severe pain. (Patient not taking: Reported on 07/23/2014) 30 tablet 0  . oxyCODONE-acetaminophen (PERCOCET/ROXICET) 5-325 MG per tablet Take 1 tablet by mouth every 6 (six) hours as needed for severe pain.    . predniSONE (DELTASONE) 10 MG  tablet Take 10 mg by mouth 2 (two) times daily with a meal. For 6 days    . promethazine (PHENERGAN) 12.5 MG suppository Place 0.5 suppositories (6.25 mg total) rectally every 6 (six) hours as needed for nausea or vomiting. (Patient not taking: Reported on 07/23/2014) 12 each 0  . senna (SENOKOT) 8.6 MG TABS tablet Take 2 tablets by mouth 2 (two) times daily.     No current facility-administered medications for this visit.    ROS:   General:  No weight loss, Fever, chills  HEENT: No recent headaches, no nasal bleeding, no visual changes, no sore throat  Neurologic: No dizziness, blackouts, seizures. No recent symptoms of stroke or mini- stroke. No recent episodes of slurred speech, or temporary blindness.  Cardiac: No recent episodes of chest pain/pressure, no shortness of breath at rest.  No shortness of breath with exertion.  Denies history of atrial fibrillation or irregular heartbeat  Vascular:  No history of rest pain in feet.  No history of claudication.  No history of non-healing ulcer, No history of DVT   Pulmonary: No home oxygen, no productive cough, no hemoptysis,  No asthma or wheezing  Musculoskeletal:  [ ]  Arthritis, [ ]  Low back pain,  [ ]  Joint pain  Hematologic:No history of hypercoagulable state.  No history of easy bleeding.  No history of anemia  Gastrointestinal: No hematochezia or melena,  No gastroesophageal reflux, no trouble swallowing  Urinary: [ ]  chronic Kidney disease, [ ]  on HD - [ ]  MWF or [ ]  TTHS, [ ]  Burning with urination, [ ]  Frequent urination, [ ]  Difficulty urinating;   Skin: No rashes  Psychological: No history of anxiety,  No history of depression   Physical Examination  Filed Vitals:   07/23/14 1535  BP: 117/50  Pulse: 84  Resp: 18  Height: 5\' 9"  (1.753 m)  Weight: 202 lb (91.627 kg)    Body mass index is 29.82 kg/(m^2).  General:  Alert and oriented, no acute distress HEENT: Normal Neck: No bruit or JVD Pulmonary: Clear to auscultation bilaterally Cardiac: Regular Rate and Rhythm without murmur Abdomen: Soft, non-tender, non-distended, no mass, no scars Skin: No rash Extremity Pulses:  2+ radial palpable thrill in the distal AV fistula.  Well healed incisions. Musculoskeletal: No deformity or edema  Neurologic: Upper and lower extremity motor 5/5 and symmetric     ASSESSMENT:   CKD stage IV He is not yet on dialysis but will need to start soon.   PLAN:   Transposition left basilic AV fistula by Dr. Bridgett Larsson 07/31/2014.   He was seen in the office today in conjunction with Dr. Donnetta Hutching.  Theda Sers, Marthena Whitmyer [facility] PA-C [facility] [place] Office: [phone]  I have examined the patient, reviewed and agree with above.  EARLY, TODD, MD 07/23/2014 4:16 PM

## 2014-07-30 ENCOUNTER — Encounter (HOSPITAL_COMMUNITY): Payer: Self-pay | Admitting: *Deleted

## 2014-07-30 MED ORDER — CHLORHEXIDINE GLUCONATE CLOTH 2 % EX PADS: 6.0000 | MEDICATED_PAD | Freq: Once | CUTANEOUS | Status: DC

## 2014-07-30 MED ORDER — SODIUM CHLORIDE 0.9 % IV SOLN: INTRAVENOUS | Status: DC

## 2014-07-30 MED ORDER — DEXTROSE 5 % IV SOLN
1.5000 g | INTRAVENOUS | Status: AC
  Administered 2014-07-31: 1.5 g via INTRAVENOUS
  Filled 2014-07-30: qty 1.5

## 2014-07-30 NOTE — Progress Notes (Signed)
Pre-op assessment completed by pt daughter Remonia Richter ( caregiver). According to Diane,  pt last dose of Plavix was 07/25/14 as instructed by MD. Daughter made aware to not give any diabetic medications the morning of procedure and to stop fish oil and NSAIDs. Diane verbalized understanding.

## 2014-07-31 ENCOUNTER — Ambulatory Visit (HOSPITAL_COMMUNITY)
Admission: RE | Admit: 2014-07-31 | Discharge: 2014-07-31 | Disposition: A | Discharge status: Home / Self Care | Payer: Medicare Other | Source: Ambulatory Visit | Attending: Vascular Surgery | Admitting: Vascular Surgery

## 2014-07-31 ENCOUNTER — Encounter (HOSPITAL_COMMUNITY): Payer: Self-pay | Admitting: Critical Care Medicine

## 2014-07-31 ENCOUNTER — Ambulatory Visit (HOSPITAL_COMMUNITY): Payer: Medicare Other | Admitting: Anesthesiology

## 2014-07-31 ENCOUNTER — Encounter (HOSPITAL_COMMUNITY)
Admission: RE | Disposition: A | Discharge status: Home / Self Care | Payer: Self-pay | Source: Ambulatory Visit | Attending: Vascular Surgery

## 2014-07-31 ENCOUNTER — Telehealth: Payer: Self-pay | Admitting: Vascular Surgery

## 2014-07-31 DIAGNOSIS — Z8673 Personal history of transient ischemic attack (TIA), and cerebral infarction without residual deficits: Secondary | ICD-10-CM | POA: Diagnosis not present

## 2014-07-31 DIAGNOSIS — E119 Type 2 diabetes mellitus without complications: Secondary | ICD-10-CM | POA: Insufficient documentation

## 2014-07-31 DIAGNOSIS — N185 Chronic kidney disease, stage 5: Secondary | ICD-10-CM | POA: Insufficient documentation

## 2014-07-31 DIAGNOSIS — E039 Hypothyroidism, unspecified: Secondary | ICD-10-CM | POA: Diagnosis not present

## 2014-07-31 DIAGNOSIS — Z992 Dependence on renal dialysis: Secondary | ICD-10-CM | POA: Insufficient documentation

## 2014-07-31 DIAGNOSIS — I120 Hypertensive chronic kidney disease with stage 5 chronic kidney disease or end stage renal disease: Secondary | ICD-10-CM | POA: Diagnosis present

## 2014-07-31 DIAGNOSIS — M1990 Unspecified osteoarthritis, unspecified site: Secondary | ICD-10-CM | POA: Insufficient documentation

## 2014-07-31 DIAGNOSIS — M109 Gout, unspecified: Secondary | ICD-10-CM | POA: Insufficient documentation

## 2014-07-31 HISTORY — PX: BASCILIC VEIN TRANSPOSITION: SHX5742

## 2014-07-31 HISTORY — DX: Other specified postprocedural states: R11.2

## 2014-07-31 HISTORY — DX: Other specified postprocedural states: Z98.890

## 2014-07-31 LAB — GLUCOSE, CAPILLARY
Glucose-Capillary: 63 mg/dL — ABNORMAL LOW (ref 70–99)
Glucose-Capillary: 59 mg/dL — ABNORMAL LOW (ref 70–99)
Glucose-Capillary: 62 mg/dL — ABNORMAL LOW (ref 70–99)
Glucose-Capillary: 69 mg/dL — ABNORMAL LOW (ref 70–99)
Glucose-Capillary: 103 mg/dL — ABNORMAL HIGH (ref 70–99)

## 2014-07-31 LAB — POCT I-STAT 4, (NA,K, GLUC, HGB,HCT)
Glucose, Bld: 71 mg/dL (ref 70–99)
HCT: 27 % — ABNORMAL LOW (ref 39.0–52.0)
Hemoglobin: 9.2 g/dL — ABNORMAL LOW (ref 13.0–17.0)
Potassium: 4.1 mmol/L (ref 3.5–5.1)
Sodium: 145 mmol/L (ref 135–145)

## 2014-07-31 SURGERY — TRANSPOSITION, VEIN, BASILIC
Anesthesia: General | Site: Arm Upper | Laterality: Left

## 2014-07-31 MED ORDER — THROMBIN 20000 UNITS EX SOLR
CUTANEOUS | Status: AC
  Filled 2014-07-31: qty 40000

## 2014-07-31 MED ORDER — ONDANSETRON HCL 4 MG/2ML IJ SOLN
INTRAMUSCULAR | Status: AC
  Filled 2014-07-31: qty 2

## 2014-07-31 MED ORDER — OXYCODONE HCL 5 MG PO TABS: 5.0000 mg | ORAL_TABLET | ORAL | Status: DC | PRN

## 2014-07-31 MED ORDER — DIPHENHYDRAMINE HCL 50 MG/ML IJ SOLN
INTRAMUSCULAR | Status: AC
  Filled 2014-07-31: qty 1

## 2014-07-31 MED ORDER — EPHEDRINE SULFATE 50 MG/ML IJ SOLN
INTRAMUSCULAR | Status: DC | PRN
  Administered 2014-07-31: 5 mg via INTRAVENOUS
  Administered 2014-07-31: 15 mg via INTRAVENOUS
  Administered 2014-07-31: 5 mg via INTRAVENOUS

## 2014-07-31 MED ORDER — PROPOFOL INFUSION 10 MG/ML OPTIME
INTRAVENOUS | Status: DC | PRN
  Administered 2014-07-31: 50 ug/kg/min via INTRAVENOUS

## 2014-07-31 MED ORDER — SODIUM CHLORIDE 0.9 % IR SOLN
Status: DC | PRN
  Administered 2014-07-31: 500 mL

## 2014-07-31 MED ORDER — SODIUM CHLORIDE 0.9 % IV SOLN
INTRAVENOUS | Status: DC | PRN
  Administered 2014-07-31 (×2): via INTRAVENOUS

## 2014-07-31 MED ORDER — EPHEDRINE SULFATE 50 MG/ML IJ SOLN
INTRAMUSCULAR | Status: AC
Start: 2014-07-31 — End: 2014-07-31
  Filled 2014-07-31: qty 1

## 2014-07-31 MED ORDER — GLUCOSE 40 % PO GEL
ORAL | Status: AC
  Filled 2014-07-31: qty 1

## 2014-07-31 MED ORDER — THROMBIN 20000 UNITS EX SOLR
CUTANEOUS | Status: DC | PRN
  Administered 2014-07-31 (×2): via TOPICAL

## 2014-07-31 MED ORDER — 0.9 % SODIUM CHLORIDE (POUR BTL) OPTIME
TOPICAL | Status: DC | PRN
  Administered 2014-07-31: 1000 mL

## 2014-07-31 MED ORDER — DEXTROSE 5 % IV SOLN
10.0000 mg | INTRAVENOUS | Status: DC | PRN
  Administered 2014-07-31: 20 ug/min via INTRAVENOUS

## 2014-07-31 MED ORDER — BUPIVACAINE HCL (PF) 0.5 % IJ SOLN
INTRAMUSCULAR | Status: DC | PRN
  Administered 2014-07-31: 17.5 mL

## 2014-07-31 MED ORDER — FENTANYL CITRATE 0.05 MG/ML IJ SOLN
INTRAMUSCULAR | Status: AC
  Filled 2014-07-31: qty 5

## 2014-07-31 MED ORDER — LIDOCAINE HCL (CARDIAC) 20 MG/ML IV SOLN
INTRAVENOUS | Status: AC
  Filled 2014-07-31: qty 5

## 2014-07-31 MED ORDER — DIPHENHYDRAMINE HCL 50 MG/ML IJ SOLN
INTRAMUSCULAR | Status: DC | PRN
  Administered 2014-07-31: 10 mg via INTRAVENOUS

## 2014-07-31 MED ORDER — LIDOCAINE-EPINEPHRINE (PF) 1 %-1:200000 IJ SOLN
INTRAMUSCULAR | Status: DC | PRN
  Administered 2014-07-31: 17.5 mL

## 2014-07-31 MED ORDER — SODIUM CHLORIDE 0.9 % IJ SOLN
INTRAMUSCULAR | Status: AC
  Filled 2014-07-31: qty 10

## 2014-07-31 MED ORDER — ONDANSETRON HCL 4 MG/2ML IJ SOLN
INTRAMUSCULAR | Status: DC | PRN
  Administered 2014-07-31 (×2): 4 mg via INTRAVENOUS

## 2014-07-31 MED ORDER — SODIUM CHLORIDE 0.9 % IV SOLN: INTRAVENOUS | Status: DC

## 2014-07-31 MED ORDER — LIDOCAINE-EPINEPHRINE (PF) 1 %-1:200000 IJ SOLN
INTRAMUSCULAR | Status: AC
  Filled 2014-07-31: qty 10

## 2014-07-31 MED ORDER — PROPOFOL 10 MG/ML IV BOLUS
INTRAVENOUS | Status: AC
  Filled 2014-07-31: qty 20

## 2014-07-31 MED ORDER — SUCCINYLCHOLINE CHLORIDE 20 MG/ML IJ SOLN
INTRAMUSCULAR | Status: AC
  Filled 2014-07-31: qty 1

## 2014-07-31 MED ORDER — FENTANYL CITRATE 0.05 MG/ML IJ SOLN
INTRAMUSCULAR | Status: DC | PRN
  Administered 2014-07-31 (×4): 25 ug via INTRAVENOUS

## 2014-07-31 MED ORDER — GLUCOSE 40 % PO GEL
1.0000 | Freq: Once | ORAL | Status: AC
  Administered 2014-07-31: 37.5 g via ORAL

## 2014-07-31 SURGICAL SUPPLY — 40 items
CANISTER SUCTION 2500CC (MISCELLANEOUS) ×3 IMPLANT
CLIP TI MEDIUM 24 (CLIP) ×3 IMPLANT
CLIP TI WIDE RED SMALL 24 (CLIP) ×3 IMPLANT
CORDS BIPOLAR (ELECTRODE) IMPLANT
COVER PROBE W GEL 5X96 (DRAPES) ×2 IMPLANT
COVER SURGICAL LIGHT HANDLE (MISCELLANEOUS) ×3 IMPLANT
DECANTER SPIKE VIAL GLASS SM (MISCELLANEOUS) ×3 IMPLANT
ELECT REM PT RETURN 9FT ADLT (ELECTROSURGICAL) ×3
ELECTRODE REM PT RTRN 9FT ADLT (ELECTROSURGICAL) ×1 IMPLANT
GLOVE BIO SURGEON STRL SZ 6.5 (GLOVE) ×1 IMPLANT
GLOVE BIO SURGEON STRL SZ7 (GLOVE) ×3 IMPLANT
GLOVE BIO SURGEONS STRL SZ 6.5 (GLOVE) ×1
GLOVE BIOGEL PI IND STRL 6.5 (GLOVE) IMPLANT
GLOVE BIOGEL PI IND STRL 7.0 (GLOVE) IMPLANT
GLOVE BIOGEL PI IND STRL 7.5 (GLOVE) ×1 IMPLANT
GLOVE BIOGEL PI INDICATOR 6.5 (GLOVE) ×4
GLOVE BIOGEL PI INDICATOR 7.0 (GLOVE) ×2
GLOVE BIOGEL PI INDICATOR 7.5 (GLOVE) ×2
GLOVE SURG SS PI 7.0 STRL IVOR (GLOVE) ×2 IMPLANT
GOWN STRL REUS W/ TWL LRG LVL3 (GOWN DISPOSABLE) ×3 IMPLANT
GOWN STRL REUS W/TWL LRG LVL3 (GOWN DISPOSABLE) ×6
GOWN STRL REUS W/TWL XL LVL3 (GOWN DISPOSABLE) ×2 IMPLANT
KIT BASIN OR (CUSTOM PROCEDURE TRAY) ×3 IMPLANT
KIT ROOM TURNOVER OR (KITS) ×3 IMPLANT
LIQUID BAND (GAUZE/BANDAGES/DRESSINGS) ×3 IMPLANT
NS IRRIG 1000ML POUR BTL (IV SOLUTION) ×3 IMPLANT
PACK CV ACCESS (CUSTOM PROCEDURE TRAY) ×3 IMPLANT
PAD ARMBOARD 7.5X6 YLW CONV (MISCELLANEOUS) ×6 IMPLANT
PROBE PENCIL 8 MHZ STRL DISP (MISCELLANEOUS) IMPLANT
SPONGE SURGIFOAM ABS GEL 100 (HEMOSTASIS) IMPLANT
SUT MNCRL AB 4-0 PS2 18 (SUTURE) ×3 IMPLANT
SUT PROLENE 6 0 BV (SUTURE) ×3 IMPLANT
SUT PROLENE 7 0 BV 1 (SUTURE) IMPLANT
SUT SILK 2 0 SH (SUTURE) ×3 IMPLANT
SUT VIC AB 2-0 CT1 27 (SUTURE) ×6
SUT VIC AB 2-0 CT1 TAPERPNT 27 (SUTURE) ×1 IMPLANT
SUT VIC AB 3-0 SH 27 (SUTURE) ×9
SUT VIC AB 3-0 SH 27X BRD (SUTURE) ×2 IMPLANT
UNDERPAD 30X30 INCONTINENT (UNDERPADS AND DIAPERS) ×3 IMPLANT
WATER STERILE IRR 1000ML POUR (IV SOLUTION) ×3 IMPLANT

## 2014-07-31 NOTE — Progress Notes (Signed)
Hypoglycemic Event  CBG: 62   Treatment: 15 GM carbohydrate snack  Symptoms: None  Follow-up CBG: Time:1115  CBG Result:59  Possible Reasons for Event: Inadequate meal intake  Comments/MD notified:    Pilar Plate  Hypoglycemic Event at 1130  CBG: 69  Treatment: 15 GM gel  Symptoms: None  Follow-up CBG: Time:1145 CBG Result:103  Possible Reasons for Event: Inadequate meal intake  Comments/MD notified:    Pilar Plate  Remember to initiate Hypoglycemia Order Set & complete Remember to initiate Hypoglycemia Order Set & complete

## 2014-07-31 NOTE — H&P (View-Only) (Signed)
HISTORY AND PHYSICAL   History of Present Illness:  Patient is a 81 y.o. year old male who presents for evaluation of left AV fistula.  L 1st stage BVT (Date: 04/08/14). The patient's wounds are healed. The patient notes no steal symptoms. The patient is able to complete their activities of daily living.  He was last seen by Dr. Bridgett Larsson on 05/24/2014.  At that visit he was not on dialysis nor did they think he would be soon.  He returns today with a report from Dr. Lowanda Foster that he will need dialysis sooner than later.   Other medical problems include has CKD (chronic kidney disease), stage III on his problem list.  Past Medical History  Diagnosis Date  . Diabetes mellitus without complication   . Chronic kidney disease   . Anemia   . Metabolic bone disease   . Leg edema   . Proteinuria   . Cancer     Prostate  . Arthritis     gout  . Stroke     residual left -sided weakness  . Hypothyroidism   . Gout   . Chronic back pain   . Hypertension     Denies history of HTN    Past Surgical History  Procedure Laterality Date  . Colonoscopy w/ biopsies and polypectomy    . Cataract extraction    . Av fistula placement Left 04/08/2014    Procedure: ARTERIOVENOUS (AV) FISTULA CREATION;  Surgeon: Conrad Ronan, MD;  Location: [facility];  Service: Vascular;  Laterality: Left;    Social History History  Substance Use Topics  . Smoking status: Never Smoker   . Smokeless tobacco: Never Used  . Alcohol Use: No    Family History Family History  Problem Relation Age of Onset  . Diabetes Mother   . Cancer Daughter   . Diabetes Daughter   . Hyperlipidemia Daughter     Allergies  No Known Allergies   Current Outpatient Prescriptions  Medication Sig Dispense Refill  . Calcium Carbonate (CALTRATE 600 PO) Take 600 mg by mouth daily.     . calcium carbonate (OS-CAL) 600 MG TABS tablet Take 600 mg by mouth 2 (two) times daily with a meal.    . clopidogrel (PLAVIX) 75 MG tablet Take 75  mg by mouth daily.    Marland Kitchen glipiZIDE (GLUCOTROL) 5 MG tablet Take 5 mg by mouth 2 (two) times daily.     Marland Kitchen levothyroxine (SYNTHROID, LEVOTHROID) 88 MCG tablet Take 88 mcg by mouth daily before breakfast.    . pioglitazone (ACTOS) 30 MG tablet Take 30 mg by mouth daily.    . polyethylene glycol (MIRALAX / GLYCOLAX) packet Take 17 g by mouth daily.    . sodium bicarbonate 650 MG tablet Take 650 mg by mouth 3 (three) times daily.    Marland Kitchen torsemide (DEMADEX) 10 MG tablet Take 10 mg by mouth daily.    Marland Kitchen allopurinol (ZYLOPRIM) 100 MG tablet Take 100 mg by mouth daily.    . Omega-3 Fatty Acids (FISH OIL) 1000 MG CAPS Take 1,000 mg by mouth daily.     Marland Kitchen oxyCODONE (ROXICODONE) 5 MG immediate release tablet Take 1 tablet (5 mg total) by mouth every 4 (four) hours as needed for severe pain. (Patient not taking: Reported on 07/23/2014) 30 tablet 0  . oxyCODONE-acetaminophen (PERCOCET/ROXICET) 5-325 MG per tablet Take 1 tablet by mouth every 6 (six) hours as needed for severe pain.    . predniSONE (DELTASONE) 10 MG  tablet Take 10 mg by mouth 2 (two) times daily with a meal. For 6 days    . promethazine (PHENERGAN) 12.5 MG suppository Place 0.5 suppositories (6.25 mg total) rectally every 6 (six) hours as needed for nausea or vomiting. (Patient not taking: Reported on 07/23/2014) 12 each 0  . senna (SENOKOT) 8.6 MG TABS tablet Take 2 tablets by mouth 2 (two) times daily.     No current facility-administered medications for this visit.    ROS:   General:  No weight loss, Fever, chills  HEENT: No recent headaches, no nasal bleeding, no visual changes, no sore throat  Neurologic: No dizziness, blackouts, seizures. No recent symptoms of stroke or mini- stroke. No recent episodes of slurred speech, or temporary blindness.  Cardiac: No recent episodes of chest pain/pressure, no shortness of breath at rest.  No shortness of breath with exertion.  Denies history of atrial fibrillation or irregular heartbeat  Vascular:  No history of rest pain in feet.  No history of claudication.  No history of non-healing ulcer, No history of DVT   Pulmonary: No home oxygen, no productive cough, no hemoptysis,  No asthma or wheezing  Musculoskeletal:  [ ]  Arthritis, [ ]  Low back pain,  [ ]  Joint pain  Hematologic:No history of hypercoagulable state.  No history of easy bleeding.  No history of anemia  Gastrointestinal: No hematochezia or melena,  No gastroesophageal reflux, no trouble swallowing  Urinary: [ ]  chronic Kidney disease, [ ]  on HD - [ ]  MWF or [ ]  TTHS, [ ]  Burning with urination, [ ]  Frequent urination, [ ]  Difficulty urinating;   Skin: No rashes  Psychological: No history of anxiety,  No history of depression   Physical Examination  Filed Vitals:   07/23/14 1535  BP: 117/50  Pulse: 84  Resp: 18  Height: 5\' 9"  (1.753 m)  Weight: 202 lb (91.627 kg)    Body mass index is 29.82 kg/(m^2).  General:  Alert and oriented, no acute distress HEENT: Normal Neck: No bruit or JVD Pulmonary: Clear to auscultation bilaterally Cardiac: Regular Rate and Rhythm without murmur Abdomen: Soft, non-tender, non-distended, no mass, no scars Skin: No rash Extremity Pulses:  2+ radial palpable thrill in the distal AV fistula.  Well healed incisions. Musculoskeletal: No deformity or edema  Neurologic: Upper and lower extremity motor 5/5 and symmetric     ASSESSMENT:   CKD stage IV He is not yet on dialysis but will need to start soon.   PLAN:   Transposition left basilic AV fistula by Dr. Bridgett Larsson 07/31/2014.   He was seen in the office today in conjunction with Dr. Donnetta Hutching.  Theda Sers, EMMA [facility] PA-C [facility] Office: [phone]  I have examined the patient, reviewed and agree with above.  Vartan Kerins, MD 07/23/2014 4:16 PM

## 2014-07-31 NOTE — Anesthesia Procedure Notes (Signed)
Procedure Name: MAC Date/Time: 07/31/2014 8:35 AM Performed by: Carola Frost Pre-anesthesia Checklist: Patient identified, Timeout performed, Emergency Drugs available, Suction available and Patient being monitored Patient Re-evaluated:Patient Re-evaluated prior to inductionOxygen Delivery Method: Simple face mask Intubation Type: IV induction Ventilation: Oral airway inserted - appropriate to patient size Placement Confirmation: positive ETCO2 and breath sounds checked- equal and bilateral Dental Injury: Teeth and Oropharynx as per pre-operative assessment

## 2014-07-31 NOTE — Anesthesia Preprocedure Evaluation (Addendum)
Anesthesia Evaluation  Patient identified by MRN, date of birth, ID band Patient awake    Reviewed: Allergy & Precautions, NPO status , Patient's Chart, lab work & pertinent test results  History of Anesthesia Complications (+) PONV  Airway Mallampati: I  TM Distance: >3 FB Neck ROM: Full    Dental  (+) Lower Dentures, Upper Dentures   Pulmonary          Cardiovascular hypertension,     Neuro/Psych CVA    GI/Hepatic   Endo/Other  diabetes, Type 2, Oral Hypoglycemic AgentsHypothyroidism   Renal/GU CRF, Dialysis and ESRFRenal disease     Musculoskeletal  (+) Arthritis -,   Abdominal   Peds  Hematology  (+) anemia ,   Anesthesia Other Findings   Reproductive/Obstetrics                            Anesthesia Physical Anesthesia Plan  ASA: III  Anesthesia Plan: General   Post-op Pain Management:    Induction: Intravenous  Airway Management Planned: LMA and Oral ETT  Additional Equipment:   Intra-op Plan:   Post-operative Plan: Extubation in OR  Informed Consent: I have reviewed the patients History and Physical, chart, labs and discussed the procedure including the risks, benefits and alternatives for the proposed anesthesia with the patient or authorized representative who has indicated his/her understanding and acceptance.     Plan Discussed with: CRNA, Anesthesiologist and Surgeon  Anesthesia Plan Comments:         Anesthesia Quick Evaluation

## 2014-07-31 NOTE — Anesthesia Postprocedure Evaluation (Signed)
  Anesthesia Post-op Note  Patient: Cameron Phelps  Procedure(s) Performed: Procedure(s): SECOND STAGE BASILIC VEIN TRANSPOSITION LEFT ARM (Left)  Patient Location: PACU  Anesthesia Type:MAC  Level of Consciousness: awake, alert , oriented and patient cooperative  Airway and Oxygen Therapy: Patient Spontanous Breathing  Post-op Pain: mild  Post-op Assessment: Post-op Vital signs reviewed, Patient's Cardiovascular Status Stable, Respiratory Function Stable, Patent Airway, No signs of Nausea or vomiting and Pain level controlled  Post-op Vital Signs: stable  Last Vitals:  Filed Vitals:   07/31/14 1100  BP: 119/48  Pulse: 65  Temp: 36.3 C  Resp:     Complications: No apparent anesthesia complications

## 2014-07-31 NOTE — Interval H&P Note (Signed)
History and Physical Interval Note:  07/31/2014 8:05 AM  Dayton Scrape  has presented today for surgery, with the diagnosis of Stage IV Chronic Kidney Disease N18.4  The various methods of treatment have been discussed with the patient and family. After consideration of risks, benefits and other options for treatment, the patient has consented to  Procedure(s): SECOND STAGE BASILIC VEIN TRANSPOSITION (Left) as a surgical intervention .  The patient's history has been reviewed, patient examined, no change in status, stable for surgery.  I have reviewed the patient's chart and labs.  Questions were answered to the patient's satisfaction.     CHEN,BRIAN LIANG-YU

## 2014-07-31 NOTE — Progress Notes (Addendum)
Reported CBG of 61 to Dr. Suzette Battiest and pt denying any symptoms.  Pt states he usually knows when his sugar is low and he feels fine at this point.  No orders at this time, and call Dr. Sherren Kerns if pt becomes symptomatic. Pt  Encouraged to let us know if he feels like his sugar is getting low. Voiced understanding.

## 2014-07-31 NOTE — Op Note (Signed)
    OPERATIVE NOTE   PROCEDURE: left second stage basilic vein transposition (brachiobasilic arteriovenous fistula) placement  PRE-OPERATIVE DIAGNOSIS: chronic kidney disease stage V, successful first stage basilic vein transposition    POST-OPERATIVE DIAGNOSIS: same as above   SURGEON: Adele Barthel, MD  ASSISTANT(S): Silva Bandy, PAC   ANESTHESIA: local and MAC  ESTIMATED BLOOD LOSS: 50 cc  FINDING(S): 1.  Fistula > 6 mm through most of length except near anastomosis (5 mm)  SPECIMEN(S):  none  INDICATIONS:   Cameron Phelps is a 81 y.o. male who presents with chronic kidney disease stage V.  The patient is scheduled for left second stage basilic vein transposition.  The patient is aware the risks include but are not limited to: bleeding, infection, steal syndrome, nerve damage, ischemic monomelic neuropathy, failure to mature, and need for additional procedures.  The patient is aware of the risks of the procedure and elects to proceed forward.  DESCRIPTION: After full informed written consent was obtained from the patient, the patient was brought back to the operating room and placed supine upon the operating table.  Prior to induction, the patient received IV antibiotics.   After obtaining adequate anesthesia, the patient was then prepped and draped in the standard fashion for a left arm access procedure.  I turned my attention first to identifying the patient's brachiobasilic arteriovenous fistula.  Using SonoSite guidance, the location of this fistula was marked out on the skin.   At this point, I injected local anesthetic to obtain a field block of the antecubitum.  In total, I injected about 35 mL of a 1:1 mixture of 0.5% Marcaine without epinephrine and 1% lidocaine with epinephrine.  I made an longitudinal incision over the fistula from its arterial anastomosis up to its axillary extent.  I carefully dissected the fistula away from its adjacent nerves.  Eventually the entirety of this  fistula was mobilized and I dissected a plane on top of the bicipital fascia with electrocautery.  The fistula was secured in its new location with loosely placed interrupted 3-0 Vicryl stitches tied to side branches and the fascia.  The deep subcutaneous tissue was inspected for bleeding.  Bleeding was controlled with electrocautery and placement of large pieces of thrombin and gelfoam.  I washed out the surgical site after waiting a few minutes, and there was no further bleeding.  The fascia was reapproximated with interrupted stitches of 3-0 Vicryl to eliminate some of the deep space.  The superficial subcutaneous tissue was then reapproximated along the incision line with a running stitch of 3-0 Vicryl.  The skin was then reapproximated with a running subcuticular of 4-0 Monocryl.  The skin was then cleaned, dried, and reinforced with Dermabond.  The patient tolerated this procedure well.   COMPLICATIONS: none  CONDITION: stable  Adele Barthel, MD [facility] Office: [phone] Pager: [phone]  07/31/2014, 10:08 AM

## 2014-07-31 NOTE — Transfer of Care (Signed)
Immediate Anesthesia Transfer of Care Note  Patient: Cameron Phelps  Procedure(s) Performed: Procedure(s): SECOND STAGE BASILIC VEIN TRANSPOSITION LEFT ARM (Left)  Patient Location: PACU  Anesthesia Type:General  Level of Consciousness: awake, alert  and oriented  Airway & Oxygen Therapy: Patient Spontanous Breathing  Post-op Assessment: Report given to PACU RN, Post -op Vital signs reviewed and stable and Patient moving all extremities X 4  Post vital signs: Reviewed and stable  Complications: No apparent anesthesia complications

## 2014-07-31 NOTE — Telephone Encounter (Addendum)
-----   Message from Mena Goes, RN sent at 07/31/2014 10:40 AM EST ----- Regarding: Schedule   ----- Message -----    From: Alvia Grove, PA-C    Sent: 07/31/2014  10:33 AM      To: Vvs Charge Pool  S/p 2nd stage basilic vein transposition 07/31/14  F/u with Dr. Bridgett Larsson in 4 weeks with no duplex  Thanks Kim  07/31/14: lm for pt re appt, dpm

## 2014-08-01 ENCOUNTER — Encounter (HOSPITAL_COMMUNITY): Payer: Self-pay | Admitting: Vascular Surgery

## 2014-08-01 ENCOUNTER — Telehealth: Payer: Self-pay

## 2014-08-01 DIAGNOSIS — R112 Nausea with vomiting, unspecified: Secondary | ICD-10-CM

## 2014-08-01 DIAGNOSIS — Z9889 Other specified postprocedural states: Principal | ICD-10-CM

## 2014-08-01 MED ORDER — ONDANSETRON HCL 4 MG PO TABS: 4.0000 mg | ORAL_TABLET | Freq: Three times a day (TID) | ORAL | Status: DC | PRN

## 2014-08-01 NOTE — Telephone Encounter (Signed)
Phone call from pt's daughter.  Stated the pt. Is c/o nausea, and is gagging, since is surgery yesterday.  Reported that he also c/o "a bad taste" in his mouth.  Stated that he had this same problem following his first surgery for the access, back in September.  Requested medication for the nausea.  Discussed with Dr. Oneida Alar.  Recommended Zofran 4 mg, po., q 8 hrs/ prn/ nausea, qty 15; no refills.  Advised daughter that an Rx will be sent to the pharmacy for nausea, and to call office if symptoms persist.  Verb. Understanding.

## 2014-08-06 ENCOUNTER — Telehealth: Payer: Self-pay | Admitting: *Deleted

## 2014-08-06 NOTE — Telephone Encounter (Signed)
Daughter, Remonia Richter, called regarding [name] having slight swelling in his left hand; pt is S/P Left 2nd stage BVT on 07-31-14 by Dr. Bridgett Larsson.  Ms. Tye Savoy states that he is having no pain, no numbness or tingling and no drainage from his wounds. He is afebrile. She reports that his incision looks good with only slight hematoma seen;  She can palpate a thrill. I told her that this slight swelling was normal and to use elevation to help this. She will call back if he has any symptoms of steal or infection.

## 2014-08-30 ENCOUNTER — Encounter: Payer: Medicare Other | Admitting: Vascular Surgery

## 2014-09-05 ENCOUNTER — Telehealth: Payer: Self-pay

## 2014-09-05 NOTE — Telephone Encounter (Signed)
Phone call from pt's daughter.  Reported that pt's left arm surgical site "is turning black".  Upon questioning the pt. described the area above and below the left elbow as being "a lot darker" that it had been. Denied any cooler temperature of the site; stated "the skin feels warm."  Reported the incision is dry with some scabbing in areas along the incision line.  Reported that the hand is still swollen.  Reported able to grip with his hand.  Denied any numbness of the left hand.   Will schedule appt. for eval.

## 2014-09-06 ENCOUNTER — Encounter: Payer: Self-pay | Admitting: Family

## 2014-09-06 ENCOUNTER — Ambulatory Visit (INDEPENDENT_AMBULATORY_CARE_PROVIDER_SITE_OTHER): Payer: Self-pay | Admitting: Family

## 2014-09-06 VITALS — BP 137/63 | HR 74 | Temp 97.2°F | Resp 16 | Ht 69.0 in | Wt 202.0 lb

## 2014-09-06 DIAGNOSIS — I770 Arteriovenous fistula, acquired: Secondary | ICD-10-CM

## 2014-09-06 DIAGNOSIS — N186 End stage renal disease: Secondary | ICD-10-CM

## 2014-09-06 NOTE — Progress Notes (Signed)
    Postoperative Access Visit   History of Present Illness  Cameron Phelps is a 81 y.o. year old male pt of Dr. Bridgett Larsson, Donnetta Hutching, and Kellie Simmering who is S/P Left 2nd stage BVT 07-31-14.  Pt returns with C/O Left BVT site discoloration, swollen and tingling, duration 1-2days. Clothing also irritates pts. left arm 1-2 days. Swelling in left arm is unchanged since the above procedure, dark discoloration in left upper arm started 2 days ago, is not worsening, he denies fever or chills. He is not on dialysis yet, has an appointment with Dr. Cristela Felt in the next week or two, pt states he will know more about whether he needs HD at that time.   The patient's wounds are healed.  The patient notes mild tingling in left forearm but no other steal symptoms.  The patient is able to complete their activities of daily living.     Physical Examination Filed Vitals:   09/06/14 1019  BP: 137/63  Pulse: 74  Temp: 97.2 F (36.2 C)  TempSrc: Oral  Resp: 16  Height: 5\' 9"  (1.753 m)  Weight: 202 lb (91.627 kg)  SpO2: 100%   Body mass index is 29.82 kg/(m^2).   Left UE: Incision is healed, skin feels warm, hand grip is 5/5, sensation in digits is intact, palpable thrill, bruit can be auscultated  Medical Decision Making  Cameron Phelps is a 81 y.o. year old male who presents S/P Left 2nd stage BVT 07-31-14 .  Dr. Bridgett Larsson spoke with and examined pt: normal degree of ecchymosis expected after this procedure; may use warm compresses to accelerate resolution of ecchymosis.  Cancel 09/13/14 follow up with Dr. Bridgett Larsson, reschedule for 2-3 weeks follow up.  Thank you for allowing [place] to participate in this patient's care.  NICKEL, Sharmon Leyden, RN, MSN, FNP-C [facility] Office: [phone]  09/06/2014, 10:26 AM  Clinic MD: Bridgett Larsson

## 2014-09-13 ENCOUNTER — Encounter: Payer: Medicare Other | Admitting: Vascular Surgery

## 2014-09-17 ENCOUNTER — Encounter: Payer: Self-pay | Admitting: Vascular Surgery

## 2014-09-18 ENCOUNTER — Ambulatory Visit (INDEPENDENT_AMBULATORY_CARE_PROVIDER_SITE_OTHER): Payer: Self-pay | Admitting: Vascular Surgery

## 2014-09-18 ENCOUNTER — Encounter: Payer: Self-pay | Admitting: Vascular Surgery

## 2014-09-18 VITALS — BP 119/54 | HR 70 | Resp 14 | Ht 69.0 in | Wt 208.0 lb

## 2014-09-18 DIAGNOSIS — N183 Chronic kidney disease, stage 3 unspecified: Secondary | ICD-10-CM

## 2014-09-18 NOTE — Progress Notes (Signed)
    Postoperative Access Visit   History of Present Illness  Cameron Phelps is a 81 y.o. year old male who presents for postoperative follow-up for: L 2nd BVT (Date: 07/31/14).  The patient's wounds are healed.  The patient notes no steal symptoms.  The patient is  able to complete their activities of daily living.  The patient's current symptoms are: mild L arm swelling.  For VQI Use Only  PRE-ADM LIVING: Home  AMB STATUS: Ambulatory  Physical Examination Filed Vitals:   09/18/14 1022  BP: 119/54  Pulse: 70  Resp: 14    LUE: Incision is healed, skin feels warm, hand grip is 5/5, sensation in digits is intact, palpable thrill, bruit can  be auscultated , palpable L Radial pulse, mild L arm swelling (upper arm >> lower arm) improved from previousy  Medical Decision Making  Cameron Phelps is a 81 y.o. year old male who presents s/p L 2nd BVT.  The patient's access is ready for use.  Thank you for allowing [place] to participate in this patient's care.  Adele Barthel, MD [facility] Office: [phone] Pager: [phone]  09/18/2014, 10:55 AM

## 2015-07-24 DIAGNOSIS — R809 Proteinuria, unspecified: Secondary | ICD-10-CM | POA: Diagnosis not present

## 2015-07-24 DIAGNOSIS — E559 Vitamin D deficiency, unspecified: Secondary | ICD-10-CM | POA: Diagnosis not present

## 2015-07-24 DIAGNOSIS — Z79899 Other long term (current) drug therapy: Secondary | ICD-10-CM | POA: Diagnosis not present

## 2015-07-24 DIAGNOSIS — N185 Chronic kidney disease, stage 5: Secondary | ICD-10-CM | POA: Diagnosis not present

## 2015-07-29 DIAGNOSIS — R809 Proteinuria, unspecified: Secondary | ICD-10-CM | POA: Diagnosis not present

## 2015-07-29 DIAGNOSIS — D509 Iron deficiency anemia, unspecified: Secondary | ICD-10-CM | POA: Diagnosis not present

## 2015-07-29 DIAGNOSIS — I509 Heart failure, unspecified: Secondary | ICD-10-CM | POA: Diagnosis not present

## 2015-07-29 DIAGNOSIS — N184 Chronic kidney disease, stage 4 (severe): Secondary | ICD-10-CM | POA: Diagnosis not present

## 2015-07-29 DIAGNOSIS — E1129 Type 2 diabetes mellitus with other diabetic kidney complication: Secondary | ICD-10-CM | POA: Diagnosis not present

## 2015-07-29 DIAGNOSIS — I10 Essential (primary) hypertension: Secondary | ICD-10-CM | POA: Diagnosis not present

## 2015-07-30 DIAGNOSIS — E1140 Type 2 diabetes mellitus with diabetic neuropathy, unspecified: Secondary | ICD-10-CM | POA: Diagnosis not present

## 2015-07-30 DIAGNOSIS — E1151 Type 2 diabetes mellitus with diabetic peripheral angiopathy without gangrene: Secondary | ICD-10-CM | POA: Diagnosis not present

## 2015-08-07 DIAGNOSIS — R809 Proteinuria, unspecified: Secondary | ICD-10-CM | POA: Diagnosis not present

## 2015-08-07 DIAGNOSIS — D509 Iron deficiency anemia, unspecified: Secondary | ICD-10-CM | POA: Diagnosis not present

## 2015-08-07 DIAGNOSIS — E559 Vitamin D deficiency, unspecified: Secondary | ICD-10-CM | POA: Diagnosis not present

## 2015-08-07 DIAGNOSIS — Z79899 Other long term (current) drug therapy: Secondary | ICD-10-CM | POA: Diagnosis not present

## 2015-08-07 DIAGNOSIS — N185 Chronic kidney disease, stage 5: Secondary | ICD-10-CM | POA: Diagnosis not present

## 2015-08-21 DIAGNOSIS — N185 Chronic kidney disease, stage 5: Secondary | ICD-10-CM | POA: Diagnosis not present

## 2015-08-21 DIAGNOSIS — D509 Iron deficiency anemia, unspecified: Secondary | ICD-10-CM | POA: Diagnosis not present

## 2015-09-04 DIAGNOSIS — N185 Chronic kidney disease, stage 5: Secondary | ICD-10-CM | POA: Diagnosis not present

## 2015-09-05 DIAGNOSIS — I639 Cerebral infarction, unspecified: Secondary | ICD-10-CM | POA: Diagnosis not present

## 2015-09-05 DIAGNOSIS — M47816 Spondylosis without myelopathy or radiculopathy, lumbar region: Secondary | ICD-10-CM | POA: Diagnosis not present

## 2015-09-05 DIAGNOSIS — E1165 Type 2 diabetes mellitus with hyperglycemia: Secondary | ICD-10-CM | POA: Diagnosis not present

## 2015-09-05 DIAGNOSIS — N184 Chronic kidney disease, stage 4 (severe): Secondary | ICD-10-CM | POA: Diagnosis not present

## 2015-09-18 DIAGNOSIS — N185 Chronic kidney disease, stage 5: Secondary | ICD-10-CM | POA: Diagnosis not present

## 2015-09-29 DIAGNOSIS — R0602 Shortness of breath: Secondary | ICD-10-CM | POA: Diagnosis not present

## 2015-09-30 DIAGNOSIS — I639 Cerebral infarction, unspecified: Secondary | ICD-10-CM | POA: Diagnosis not present

## 2015-09-30 DIAGNOSIS — E119 Type 2 diabetes mellitus without complications: Secondary | ICD-10-CM | POA: Diagnosis not present

## 2015-09-30 DIAGNOSIS — M159 Polyosteoarthritis, unspecified: Secondary | ICD-10-CM | POA: Diagnosis not present

## 2015-09-30 DIAGNOSIS — E1129 Type 2 diabetes mellitus with other diabetic kidney complication: Secondary | ICD-10-CM | POA: Diagnosis not present

## 2015-09-30 DIAGNOSIS — I10 Essential (primary) hypertension: Secondary | ICD-10-CM | POA: Diagnosis not present

## 2015-09-30 DIAGNOSIS — I509 Heart failure, unspecified: Secondary | ICD-10-CM | POA: Diagnosis not present

## 2015-09-30 DIAGNOSIS — N184 Chronic kidney disease, stage 4 (severe): Secondary | ICD-10-CM | POA: Diagnosis not present

## 2015-10-02 DIAGNOSIS — N185 Chronic kidney disease, stage 5: Secondary | ICD-10-CM | POA: Diagnosis not present

## 2015-10-09 DIAGNOSIS — E1151 Type 2 diabetes mellitus with diabetic peripheral angiopathy without gangrene: Secondary | ICD-10-CM | POA: Diagnosis not present

## 2015-10-09 DIAGNOSIS — E1140 Type 2 diabetes mellitus with diabetic neuropathy, unspecified: Secondary | ICD-10-CM | POA: Diagnosis not present

## 2015-10-16 DIAGNOSIS — E559 Vitamin D deficiency, unspecified: Secondary | ICD-10-CM | POA: Diagnosis not present

## 2015-10-16 DIAGNOSIS — N185 Chronic kidney disease, stage 5: Secondary | ICD-10-CM | POA: Diagnosis not present

## 2015-10-16 DIAGNOSIS — Z79899 Other long term (current) drug therapy: Secondary | ICD-10-CM | POA: Diagnosis not present

## 2015-10-16 DIAGNOSIS — R809 Proteinuria, unspecified: Secondary | ICD-10-CM | POA: Diagnosis not present

## 2015-10-16 DIAGNOSIS — I120 Hypertensive chronic kidney disease with stage 5 chronic kidney disease or end stage renal disease: Secondary | ICD-10-CM | POA: Diagnosis not present

## 2015-10-30 DIAGNOSIS — Z79899 Other long term (current) drug therapy: Secondary | ICD-10-CM | POA: Diagnosis not present

## 2015-10-30 DIAGNOSIS — N185 Chronic kidney disease, stage 5: Secondary | ICD-10-CM | POA: Diagnosis not present

## 2015-10-30 DIAGNOSIS — I120 Hypertensive chronic kidney disease with stage 5 chronic kidney disease or end stage renal disease: Secondary | ICD-10-CM | POA: Diagnosis not present

## 2015-10-30 DIAGNOSIS — E559 Vitamin D deficiency, unspecified: Secondary | ICD-10-CM | POA: Diagnosis not present

## 2015-10-30 DIAGNOSIS — R809 Proteinuria, unspecified: Secondary | ICD-10-CM | POA: Diagnosis not present

## 2015-11-04 DIAGNOSIS — M159 Polyosteoarthritis, unspecified: Secondary | ICD-10-CM | POA: Diagnosis not present

## 2015-11-04 DIAGNOSIS — I639 Cerebral infarction, unspecified: Secondary | ICD-10-CM | POA: Diagnosis not present

## 2015-11-04 DIAGNOSIS — E119 Type 2 diabetes mellitus without complications: Secondary | ICD-10-CM | POA: Diagnosis not present

## 2015-11-11 DIAGNOSIS — N184 Chronic kidney disease, stage 4 (severe): Secondary | ICD-10-CM | POA: Diagnosis not present

## 2015-11-11 DIAGNOSIS — E872 Acidosis: Secondary | ICD-10-CM | POA: Diagnosis not present

## 2015-11-11 DIAGNOSIS — E1129 Type 2 diabetes mellitus with other diabetic kidney complication: Secondary | ICD-10-CM | POA: Diagnosis not present

## 2015-11-11 DIAGNOSIS — R809 Proteinuria, unspecified: Secondary | ICD-10-CM | POA: Diagnosis not present

## 2015-11-11 DIAGNOSIS — I509 Heart failure, unspecified: Secondary | ICD-10-CM | POA: Diagnosis not present

## 2015-11-11 DIAGNOSIS — I10 Essential (primary) hypertension: Secondary | ICD-10-CM | POA: Diagnosis not present

## 2015-11-13 DIAGNOSIS — E559 Vitamin D deficiency, unspecified: Secondary | ICD-10-CM | POA: Diagnosis not present

## 2015-11-13 DIAGNOSIS — R809 Proteinuria, unspecified: Secondary | ICD-10-CM | POA: Diagnosis not present

## 2015-11-13 DIAGNOSIS — N185 Chronic kidney disease, stage 5: Secondary | ICD-10-CM | POA: Diagnosis not present

## 2015-11-13 DIAGNOSIS — Z79899 Other long term (current) drug therapy: Secondary | ICD-10-CM | POA: Diagnosis not present

## 2015-11-20 DIAGNOSIS — I639 Cerebral infarction, unspecified: Secondary | ICD-10-CM | POA: Diagnosis not present

## 2015-11-20 DIAGNOSIS — E119 Type 2 diabetes mellitus without complications: Secondary | ICD-10-CM | POA: Diagnosis not present

## 2015-11-20 DIAGNOSIS — M159 Polyosteoarthritis, unspecified: Secondary | ICD-10-CM | POA: Diagnosis not present

## 2015-11-27 DIAGNOSIS — R809 Proteinuria, unspecified: Secondary | ICD-10-CM | POA: Diagnosis not present

## 2015-11-27 DIAGNOSIS — N185 Chronic kidney disease, stage 5: Secondary | ICD-10-CM | POA: Diagnosis not present

## 2015-11-27 DIAGNOSIS — Z79899 Other long term (current) drug therapy: Secondary | ICD-10-CM | POA: Diagnosis not present

## 2015-11-27 DIAGNOSIS — E559 Vitamin D deficiency, unspecified: Secondary | ICD-10-CM | POA: Diagnosis not present

## 2015-12-05 DIAGNOSIS — I639 Cerebral infarction, unspecified: Secondary | ICD-10-CM | POA: Diagnosis not present

## 2015-12-05 DIAGNOSIS — Z299 Encounter for prophylactic measures, unspecified: Secondary | ICD-10-CM | POA: Diagnosis not present

## 2015-12-05 DIAGNOSIS — N184 Chronic kidney disease, stage 4 (severe): Secondary | ICD-10-CM | POA: Diagnosis not present

## 2015-12-05 DIAGNOSIS — Z6831 Body mass index (BMI) 31.0-31.9, adult: Secondary | ICD-10-CM | POA: Diagnosis not present

## 2015-12-05 DIAGNOSIS — Z789 Other specified health status: Secondary | ICD-10-CM | POA: Diagnosis not present

## 2015-12-05 DIAGNOSIS — E1122 Type 2 diabetes mellitus with diabetic chronic kidney disease: Secondary | ICD-10-CM | POA: Diagnosis not present

## 2015-12-05 DIAGNOSIS — E1165 Type 2 diabetes mellitus with hyperglycemia: Secondary | ICD-10-CM | POA: Diagnosis not present

## 2015-12-11 DIAGNOSIS — E559 Vitamin D deficiency, unspecified: Secondary | ICD-10-CM | POA: Diagnosis not present

## 2015-12-11 DIAGNOSIS — R809 Proteinuria, unspecified: Secondary | ICD-10-CM | POA: Diagnosis not present

## 2015-12-11 DIAGNOSIS — Z79899 Other long term (current) drug therapy: Secondary | ICD-10-CM | POA: Diagnosis not present

## 2015-12-11 DIAGNOSIS — N185 Chronic kidney disease, stage 5: Secondary | ICD-10-CM | POA: Diagnosis not present

## 2015-12-18 DIAGNOSIS — E1140 Type 2 diabetes mellitus with diabetic neuropathy, unspecified: Secondary | ICD-10-CM | POA: Diagnosis not present

## 2015-12-18 DIAGNOSIS — E1151 Type 2 diabetes mellitus with diabetic peripheral angiopathy without gangrene: Secondary | ICD-10-CM | POA: Diagnosis not present

## 2015-12-25 DIAGNOSIS — D509 Iron deficiency anemia, unspecified: Secondary | ICD-10-CM | POA: Diagnosis not present

## 2015-12-25 DIAGNOSIS — E559 Vitamin D deficiency, unspecified: Secondary | ICD-10-CM | POA: Diagnosis not present

## 2015-12-25 DIAGNOSIS — Z79899 Other long term (current) drug therapy: Secondary | ICD-10-CM | POA: Diagnosis not present

## 2015-12-25 DIAGNOSIS — N185 Chronic kidney disease, stage 5: Secondary | ICD-10-CM | POA: Diagnosis not present

## 2015-12-25 DIAGNOSIS — R809 Proteinuria, unspecified: Secondary | ICD-10-CM | POA: Diagnosis not present

## 2015-12-30 DIAGNOSIS — E119 Type 2 diabetes mellitus without complications: Secondary | ICD-10-CM | POA: Diagnosis not present

## 2015-12-30 DIAGNOSIS — M159 Polyosteoarthritis, unspecified: Secondary | ICD-10-CM | POA: Diagnosis not present

## 2015-12-30 DIAGNOSIS — I639 Cerebral infarction, unspecified: Secondary | ICD-10-CM | POA: Diagnosis not present

## 2016-01-08 DIAGNOSIS — R809 Proteinuria, unspecified: Secondary | ICD-10-CM | POA: Diagnosis not present

## 2016-01-08 DIAGNOSIS — N185 Chronic kidney disease, stage 5: Secondary | ICD-10-CM | POA: Diagnosis not present

## 2016-01-08 DIAGNOSIS — E559 Vitamin D deficiency, unspecified: Secondary | ICD-10-CM | POA: Diagnosis not present

## 2016-01-08 DIAGNOSIS — Z79899 Other long term (current) drug therapy: Secondary | ICD-10-CM | POA: Diagnosis not present

## 2016-01-21 DIAGNOSIS — M159 Polyosteoarthritis, unspecified: Secondary | ICD-10-CM | POA: Diagnosis not present

## 2016-01-21 DIAGNOSIS — E119 Type 2 diabetes mellitus without complications: Secondary | ICD-10-CM | POA: Diagnosis not present

## 2016-01-21 DIAGNOSIS — I639 Cerebral infarction, unspecified: Secondary | ICD-10-CM | POA: Diagnosis not present

## 2016-01-22 DIAGNOSIS — D509 Iron deficiency anemia, unspecified: Secondary | ICD-10-CM | POA: Diagnosis not present

## 2016-01-22 DIAGNOSIS — N185 Chronic kidney disease, stage 5: Secondary | ICD-10-CM | POA: Diagnosis not present

## 2016-01-27 DIAGNOSIS — N184 Chronic kidney disease, stage 4 (severe): Secondary | ICD-10-CM | POA: Diagnosis not present

## 2016-01-27 DIAGNOSIS — I10 Essential (primary) hypertension: Secondary | ICD-10-CM | POA: Diagnosis not present

## 2016-01-27 DIAGNOSIS — E1129 Type 2 diabetes mellitus with other diabetic kidney complication: Secondary | ICD-10-CM | POA: Diagnosis not present

## 2016-01-27 DIAGNOSIS — E872 Acidosis: Secondary | ICD-10-CM | POA: Diagnosis not present

## 2016-01-27 DIAGNOSIS — I509 Heart failure, unspecified: Secondary | ICD-10-CM | POA: Diagnosis not present

## 2016-01-27 DIAGNOSIS — R809 Proteinuria, unspecified: Secondary | ICD-10-CM | POA: Diagnosis not present

## 2016-02-05 DIAGNOSIS — N185 Chronic kidney disease, stage 5: Secondary | ICD-10-CM | POA: Diagnosis not present

## 2016-02-19 DIAGNOSIS — N185 Chronic kidney disease, stage 5: Secondary | ICD-10-CM | POA: Diagnosis not present

## 2016-02-20 DIAGNOSIS — E119 Type 2 diabetes mellitus without complications: Secondary | ICD-10-CM | POA: Diagnosis not present

## 2016-02-20 DIAGNOSIS — I639 Cerebral infarction, unspecified: Secondary | ICD-10-CM | POA: Diagnosis not present

## 2016-02-20 DIAGNOSIS — M159 Polyosteoarthritis, unspecified: Secondary | ICD-10-CM | POA: Diagnosis not present

## 2016-02-26 DIAGNOSIS — E1151 Type 2 diabetes mellitus with diabetic peripheral angiopathy without gangrene: Secondary | ICD-10-CM | POA: Diagnosis not present

## 2016-02-26 DIAGNOSIS — E1140 Type 2 diabetes mellitus with diabetic neuropathy, unspecified: Secondary | ICD-10-CM | POA: Diagnosis not present

## 2016-03-04 DIAGNOSIS — N185 Chronic kidney disease, stage 5: Secondary | ICD-10-CM | POA: Diagnosis not present

## 2016-03-12 DIAGNOSIS — E1165 Type 2 diabetes mellitus with hyperglycemia: Secondary | ICD-10-CM | POA: Diagnosis not present

## 2016-03-25 DIAGNOSIS — E559 Vitamin D deficiency, unspecified: Secondary | ICD-10-CM | POA: Diagnosis not present

## 2016-03-25 DIAGNOSIS — Z79899 Other long term (current) drug therapy: Secondary | ICD-10-CM | POA: Diagnosis not present

## 2016-03-25 DIAGNOSIS — N184 Chronic kidney disease, stage 4 (severe): Secondary | ICD-10-CM | POA: Diagnosis not present

## 2016-03-25 DIAGNOSIS — R809 Proteinuria, unspecified: Secondary | ICD-10-CM | POA: Diagnosis not present

## 2016-04-05 DIAGNOSIS — R0781 Pleurodynia: Secondary | ICD-10-CM | POA: Diagnosis not present

## 2016-04-05 DIAGNOSIS — M545 Low back pain: Secondary | ICD-10-CM | POA: Diagnosis not present

## 2016-04-05 DIAGNOSIS — M47816 Spondylosis without myelopathy or radiculopathy, lumbar region: Secondary | ICD-10-CM | POA: Diagnosis not present

## 2016-04-05 DIAGNOSIS — I700 Atherosclerosis of aorta: Secondary | ICD-10-CM | POA: Diagnosis not present

## 2016-04-05 DIAGNOSIS — Z6831 Body mass index (BMI) 31.0-31.9, adult: Secondary | ICD-10-CM | POA: Diagnosis not present

## 2016-04-05 DIAGNOSIS — R0789 Other chest pain: Secondary | ICD-10-CM | POA: Diagnosis not present

## 2016-04-05 DIAGNOSIS — Z713 Dietary counseling and surveillance: Secondary | ICD-10-CM | POA: Diagnosis not present

## 2016-04-08 DIAGNOSIS — R809 Proteinuria, unspecified: Secondary | ICD-10-CM | POA: Diagnosis not present

## 2016-04-08 DIAGNOSIS — N184 Chronic kidney disease, stage 4 (severe): Secondary | ICD-10-CM | POA: Diagnosis not present

## 2016-04-08 DIAGNOSIS — Z79899 Other long term (current) drug therapy: Secondary | ICD-10-CM | POA: Diagnosis not present

## 2016-04-08 DIAGNOSIS — E559 Vitamin D deficiency, unspecified: Secondary | ICD-10-CM | POA: Diagnosis not present

## 2016-04-16 ENCOUNTER — Inpatient Hospital Stay
Admission: EM | Admit: 2016-04-16 | Payer: Self-pay | Source: Other Acute Inpatient Hospital | Admitting: Internal Medicine

## 2016-04-16 DIAGNOSIS — R531 Weakness: Secondary | ICD-10-CM | POA: Diagnosis not present

## 2016-04-16 DIAGNOSIS — Z8673 Personal history of transient ischemic attack (TIA), and cerebral infarction without residual deficits: Secondary | ICD-10-CM | POA: Diagnosis not present

## 2016-04-16 DIAGNOSIS — E1122 Type 2 diabetes mellitus with diabetic chronic kidney disease: Secondary | ICD-10-CM | POA: Diagnosis not present

## 2016-04-16 DIAGNOSIS — K921 Melena: Secondary | ICD-10-CM | POA: Diagnosis not present

## 2016-04-16 DIAGNOSIS — Z7984 Long term (current) use of oral hypoglycemic drugs: Secondary | ICD-10-CM | POA: Diagnosis not present

## 2016-04-16 DIAGNOSIS — D649 Anemia, unspecified: Secondary | ICD-10-CM | POA: Diagnosis not present

## 2016-04-16 DIAGNOSIS — Z7902 Long term (current) use of antithrombotics/antiplatelets: Secondary | ICD-10-CM | POA: Diagnosis not present

## 2016-04-16 DIAGNOSIS — N189 Chronic kidney disease, unspecified: Secondary | ICD-10-CM | POA: Diagnosis not present

## 2016-04-16 DIAGNOSIS — N179 Acute kidney failure, unspecified: Secondary | ICD-10-CM | POA: Diagnosis not present

## 2016-04-16 DIAGNOSIS — E039 Hypothyroidism, unspecified: Secondary | ICD-10-CM | POA: Diagnosis not present

## 2016-04-16 DIAGNOSIS — Z79899 Other long term (current) drug therapy: Secondary | ICD-10-CM | POA: Diagnosis not present

## 2016-04-17 DIAGNOSIS — N179 Acute kidney failure, unspecified: Secondary | ICD-10-CM | POA: Diagnosis present

## 2016-04-17 DIAGNOSIS — K253 Acute gastric ulcer without hemorrhage or perforation: Secondary | ICD-10-CM | POA: Diagnosis not present

## 2016-04-17 DIAGNOSIS — C61 Malignant neoplasm of prostate: Secondary | ICD-10-CM | POA: Diagnosis present

## 2016-04-17 DIAGNOSIS — I714 Abdominal aortic aneurysm, without rupture: Secondary | ICD-10-CM | POA: Diagnosis not present

## 2016-04-17 DIAGNOSIS — K269 Duodenal ulcer, unspecified as acute or chronic, without hemorrhage or perforation: Secondary | ICD-10-CM | POA: Diagnosis present

## 2016-04-17 DIAGNOSIS — Z8673 Personal history of transient ischemic attack (TIA), and cerebral infarction without residual deficits: Secondary | ICD-10-CM | POA: Diagnosis not present

## 2016-04-17 DIAGNOSIS — K219 Gastro-esophageal reflux disease without esophagitis: Secondary | ICD-10-CM | POA: Diagnosis not present

## 2016-04-17 DIAGNOSIS — R279 Unspecified lack of coordination: Secondary | ICD-10-CM | POA: Diagnosis not present

## 2016-04-17 DIAGNOSIS — K259 Gastric ulcer, unspecified as acute or chronic, without hemorrhage or perforation: Secondary | ICD-10-CM | POA: Diagnosis not present

## 2016-04-17 DIAGNOSIS — R531 Weakness: Secondary | ICD-10-CM | POA: Diagnosis not present

## 2016-04-17 DIAGNOSIS — Z7902 Long term (current) use of antithrombotics/antiplatelets: Secondary | ICD-10-CM | POA: Diagnosis not present

## 2016-04-17 DIAGNOSIS — N183 Chronic kidney disease, stage 3 (moderate): Secondary | ICD-10-CM | POA: Diagnosis present

## 2016-04-17 DIAGNOSIS — M160 Bilateral primary osteoarthritis of hip: Secondary | ICD-10-CM | POA: Diagnosis present

## 2016-04-17 DIAGNOSIS — Z7984 Long term (current) use of oral hypoglycemic drugs: Secondary | ICD-10-CM | POA: Diagnosis not present

## 2016-04-17 DIAGNOSIS — K254 Chronic or unspecified gastric ulcer with hemorrhage: Secondary | ICD-10-CM | POA: Diagnosis present

## 2016-04-17 DIAGNOSIS — Z6831 Body mass index (BMI) 31.0-31.9, adult: Secondary | ICD-10-CM | POA: Diagnosis not present

## 2016-04-17 DIAGNOSIS — M545 Low back pain: Secondary | ICD-10-CM | POA: Diagnosis not present

## 2016-04-17 DIAGNOSIS — D649 Anemia, unspecified: Secondary | ICD-10-CM | POA: Diagnosis not present

## 2016-04-17 DIAGNOSIS — R195 Other fecal abnormalities: Secondary | ICD-10-CM | POA: Diagnosis not present

## 2016-04-17 DIAGNOSIS — K921 Melena: Secondary | ICD-10-CM | POA: Diagnosis not present

## 2016-04-17 DIAGNOSIS — Z743 Need for continuous supervision: Secondary | ICD-10-CM | POA: Diagnosis not present

## 2016-04-17 DIAGNOSIS — E039 Hypothyroidism, unspecified: Secondary | ICD-10-CM | POA: Diagnosis present

## 2016-04-17 DIAGNOSIS — D631 Anemia in chronic kidney disease: Secondary | ICD-10-CM | POA: Diagnosis not present

## 2016-04-17 DIAGNOSIS — E669 Obesity, unspecified: Secondary | ICD-10-CM | POA: Diagnosis present

## 2016-04-17 DIAGNOSIS — R278 Other lack of coordination: Secondary | ICD-10-CM | POA: Diagnosis not present

## 2016-04-17 DIAGNOSIS — K922 Gastrointestinal hemorrhage, unspecified: Secondary | ICD-10-CM | POA: Diagnosis not present

## 2016-04-17 DIAGNOSIS — D62 Acute posthemorrhagic anemia: Secondary | ICD-10-CM | POA: Diagnosis present

## 2016-04-17 DIAGNOSIS — M6281 Muscle weakness (generalized): Secondary | ICD-10-CM | POA: Diagnosis not present

## 2016-04-17 DIAGNOSIS — I723 Aneurysm of iliac artery: Secondary | ICD-10-CM | POA: Diagnosis not present

## 2016-04-17 DIAGNOSIS — E1122 Type 2 diabetes mellitus with diabetic chronic kidney disease: Secondary | ICD-10-CM | POA: Diagnosis present

## 2016-04-17 DIAGNOSIS — R609 Edema, unspecified: Secondary | ICD-10-CM | POA: Diagnosis not present

## 2016-04-17 DIAGNOSIS — G8929 Other chronic pain: Secondary | ICD-10-CM | POA: Diagnosis not present

## 2016-04-23 DIAGNOSIS — N189 Chronic kidney disease, unspecified: Secondary | ICD-10-CM | POA: Diagnosis not present

## 2016-04-23 DIAGNOSIS — C61 Malignant neoplasm of prostate: Secondary | ICD-10-CM | POA: Diagnosis not present

## 2016-04-23 DIAGNOSIS — Z8673 Personal history of transient ischemic attack (TIA), and cerebral infarction without residual deficits: Secondary | ICD-10-CM | POA: Diagnosis not present

## 2016-04-23 DIAGNOSIS — E039 Hypothyroidism, unspecified: Secondary | ICD-10-CM | POA: Diagnosis not present

## 2016-04-23 DIAGNOSIS — D631 Anemia in chronic kidney disease: Secondary | ICD-10-CM | POA: Diagnosis not present

## 2016-04-23 DIAGNOSIS — Z7902 Long term (current) use of antithrombotics/antiplatelets: Secondary | ICD-10-CM | POA: Diagnosis not present

## 2016-04-23 DIAGNOSIS — G8929 Other chronic pain: Secondary | ICD-10-CM | POA: Diagnosis not present

## 2016-04-23 DIAGNOSIS — R05 Cough: Secondary | ICD-10-CM | POA: Diagnosis not present

## 2016-04-23 DIAGNOSIS — K219 Gastro-esophageal reflux disease without esophagitis: Secondary | ICD-10-CM | POA: Diagnosis not present

## 2016-04-23 DIAGNOSIS — K922 Gastrointestinal hemorrhage, unspecified: Secondary | ICD-10-CM | POA: Diagnosis not present

## 2016-04-23 DIAGNOSIS — M109 Gout, unspecified: Secondary | ICD-10-CM | POA: Diagnosis not present

## 2016-04-23 DIAGNOSIS — D649 Anemia, unspecified: Secondary | ICD-10-CM | POA: Diagnosis not present

## 2016-04-23 DIAGNOSIS — R278 Other lack of coordination: Secondary | ICD-10-CM | POA: Diagnosis not present

## 2016-04-23 DIAGNOSIS — M545 Low back pain: Secondary | ICD-10-CM | POA: Diagnosis not present

## 2016-04-23 DIAGNOSIS — M5136 Other intervertebral disc degeneration, lumbar region: Secondary | ICD-10-CM | POA: Diagnosis not present

## 2016-04-23 DIAGNOSIS — E1122 Type 2 diabetes mellitus with diabetic chronic kidney disease: Secondary | ICD-10-CM | POA: Diagnosis not present

## 2016-04-23 DIAGNOSIS — N183 Chronic kidney disease, stage 3 (moderate): Secondary | ICD-10-CM | POA: Diagnosis not present

## 2016-04-23 DIAGNOSIS — M6281 Muscle weakness (generalized): Secondary | ICD-10-CM | POA: Diagnosis not present

## 2016-04-23 DIAGNOSIS — J189 Pneumonia, unspecified organism: Secondary | ICD-10-CM | POA: Diagnosis not present

## 2016-04-23 DIAGNOSIS — R609 Edema, unspecified: Secondary | ICD-10-CM | POA: Diagnosis not present

## 2016-04-23 DIAGNOSIS — M160 Bilateral primary osteoarthritis of hip: Secondary | ICD-10-CM | POA: Diagnosis not present

## 2016-04-23 DIAGNOSIS — R531 Weakness: Secondary | ICD-10-CM | POA: Diagnosis not present

## 2016-04-23 DIAGNOSIS — E669 Obesity, unspecified: Secondary | ICD-10-CM | POA: Diagnosis not present

## 2016-04-23 DIAGNOSIS — K259 Gastric ulcer, unspecified as acute or chronic, without hemorrhage or perforation: Secondary | ICD-10-CM | POA: Diagnosis not present

## 2016-04-26 DIAGNOSIS — K922 Gastrointestinal hemorrhage, unspecified: Secondary | ICD-10-CM | POA: Diagnosis not present

## 2016-04-26 DIAGNOSIS — D649 Anemia, unspecified: Secondary | ICD-10-CM | POA: Diagnosis not present

## 2016-04-26 DIAGNOSIS — R531 Weakness: Secondary | ICD-10-CM | POA: Diagnosis not present

## 2016-04-26 DIAGNOSIS — N189 Chronic kidney disease, unspecified: Secondary | ICD-10-CM | POA: Diagnosis not present

## 2016-04-27 DIAGNOSIS — D649 Anemia, unspecified: Secondary | ICD-10-CM | POA: Diagnosis not present

## 2016-04-27 DIAGNOSIS — R531 Weakness: Secondary | ICD-10-CM | POA: Diagnosis not present

## 2016-05-10 DIAGNOSIS — M109 Gout, unspecified: Secondary | ICD-10-CM | POA: Diagnosis not present

## 2016-05-10 DIAGNOSIS — M5136 Other intervertebral disc degeneration, lumbar region: Secondary | ICD-10-CM | POA: Diagnosis not present

## 2016-05-10 DIAGNOSIS — K922 Gastrointestinal hemorrhage, unspecified: Secondary | ICD-10-CM | POA: Diagnosis not present

## 2016-05-10 DIAGNOSIS — K259 Gastric ulcer, unspecified as acute or chronic, without hemorrhage or perforation: Secondary | ICD-10-CM | POA: Diagnosis not present

## 2016-05-14 DIAGNOSIS — M109 Gout, unspecified: Secondary | ICD-10-CM | POA: Diagnosis not present

## 2016-05-14 DIAGNOSIS — J189 Pneumonia, unspecified organism: Secondary | ICD-10-CM | POA: Diagnosis not present

## 2016-05-20 DIAGNOSIS — Z7984 Long term (current) use of oral hypoglycemic drugs: Secondary | ICD-10-CM | POA: Diagnosis not present

## 2016-05-20 DIAGNOSIS — Z7902 Long term (current) use of antithrombotics/antiplatelets: Secondary | ICD-10-CM | POA: Diagnosis not present

## 2016-05-20 DIAGNOSIS — E1122 Type 2 diabetes mellitus with diabetic chronic kidney disease: Secondary | ICD-10-CM | POA: Diagnosis not present

## 2016-05-20 DIAGNOSIS — K219 Gastro-esophageal reflux disease without esophagitis: Secondary | ICD-10-CM | POA: Diagnosis not present

## 2016-05-20 DIAGNOSIS — L89620 Pressure ulcer of left heel, unstageable: Secondary | ICD-10-CM | POA: Diagnosis not present

## 2016-05-20 DIAGNOSIS — I129 Hypertensive chronic kidney disease with stage 1 through stage 4 chronic kidney disease, or unspecified chronic kidney disease: Secondary | ICD-10-CM | POA: Diagnosis not present

## 2016-05-20 DIAGNOSIS — D631 Anemia in chronic kidney disease: Secondary | ICD-10-CM | POA: Diagnosis not present

## 2016-05-20 DIAGNOSIS — E669 Obesity, unspecified: Secondary | ICD-10-CM | POA: Diagnosis not present

## 2016-05-20 DIAGNOSIS — K270 Acute peptic ulcer, site unspecified, with hemorrhage: Secondary | ICD-10-CM | POA: Diagnosis not present

## 2016-05-20 DIAGNOSIS — C61 Malignant neoplasm of prostate: Secondary | ICD-10-CM | POA: Diagnosis not present

## 2016-05-20 DIAGNOSIS — I69354 Hemiplegia and hemiparesis following cerebral infarction affecting left non-dominant side: Secondary | ICD-10-CM | POA: Diagnosis not present

## 2016-05-20 DIAGNOSIS — G8929 Other chronic pain: Secondary | ICD-10-CM | POA: Diagnosis not present

## 2016-05-20 DIAGNOSIS — N183 Chronic kidney disease, stage 3 (moderate): Secondary | ICD-10-CM | POA: Diagnosis not present

## 2016-05-20 DIAGNOSIS — M160 Bilateral primary osteoarthritis of hip: Secondary | ICD-10-CM | POA: Diagnosis not present

## 2016-05-20 DIAGNOSIS — N184 Chronic kidney disease, stage 4 (severe): Secondary | ICD-10-CM | POA: Diagnosis not present

## 2016-05-24 DIAGNOSIS — L89620 Pressure ulcer of left heel, unstageable: Secondary | ICD-10-CM | POA: Diagnosis not present

## 2016-05-24 DIAGNOSIS — N183 Chronic kidney disease, stage 3 (moderate): Secondary | ICD-10-CM | POA: Diagnosis not present

## 2016-05-24 DIAGNOSIS — E1122 Type 2 diabetes mellitus with diabetic chronic kidney disease: Secondary | ICD-10-CM | POA: Diagnosis not present

## 2016-05-24 DIAGNOSIS — D631 Anemia in chronic kidney disease: Secondary | ICD-10-CM | POA: Diagnosis not present

## 2016-05-24 DIAGNOSIS — I69354 Hemiplegia and hemiparesis following cerebral infarction affecting left non-dominant side: Secondary | ICD-10-CM | POA: Diagnosis not present

## 2016-05-24 DIAGNOSIS — I129 Hypertensive chronic kidney disease with stage 1 through stage 4 chronic kidney disease, or unspecified chronic kidney disease: Secondary | ICD-10-CM | POA: Diagnosis not present

## 2016-05-25 DIAGNOSIS — E1122 Type 2 diabetes mellitus with diabetic chronic kidney disease: Secondary | ICD-10-CM | POA: Diagnosis not present

## 2016-05-25 DIAGNOSIS — N183 Chronic kidney disease, stage 3 (moderate): Secondary | ICD-10-CM | POA: Diagnosis not present

## 2016-05-25 DIAGNOSIS — L89620 Pressure ulcer of left heel, unstageable: Secondary | ICD-10-CM | POA: Diagnosis not present

## 2016-05-25 DIAGNOSIS — D631 Anemia in chronic kidney disease: Secondary | ICD-10-CM | POA: Diagnosis not present

## 2016-05-25 DIAGNOSIS — I129 Hypertensive chronic kidney disease with stage 1 through stage 4 chronic kidney disease, or unspecified chronic kidney disease: Secondary | ICD-10-CM | POA: Diagnosis not present

## 2016-05-25 DIAGNOSIS — I69354 Hemiplegia and hemiparesis following cerebral infarction affecting left non-dominant side: Secondary | ICD-10-CM | POA: Diagnosis not present

## 2016-05-26 DIAGNOSIS — Z299 Encounter for prophylactic measures, unspecified: Secondary | ICD-10-CM | POA: Diagnosis not present

## 2016-05-26 DIAGNOSIS — Z713 Dietary counseling and surveillance: Secondary | ICD-10-CM | POA: Diagnosis not present

## 2016-05-26 DIAGNOSIS — Z6831 Body mass index (BMI) 31.0-31.9, adult: Secondary | ICD-10-CM | POA: Diagnosis not present

## 2016-05-26 DIAGNOSIS — E1122 Type 2 diabetes mellitus with diabetic chronic kidney disease: Secondary | ICD-10-CM | POA: Diagnosis not present

## 2016-05-26 DIAGNOSIS — I639 Cerebral infarction, unspecified: Secondary | ICD-10-CM | POA: Diagnosis not present

## 2016-05-27 DIAGNOSIS — K254 Chronic or unspecified gastric ulcer with hemorrhage: Secondary | ICD-10-CM | POA: Diagnosis not present

## 2016-05-27 DIAGNOSIS — N183 Chronic kidney disease, stage 3 (moderate): Secondary | ICD-10-CM | POA: Diagnosis not present

## 2016-05-27 DIAGNOSIS — I129 Hypertensive chronic kidney disease with stage 1 through stage 4 chronic kidney disease, or unspecified chronic kidney disease: Secondary | ICD-10-CM | POA: Diagnosis not present

## 2016-05-27 DIAGNOSIS — E1122 Type 2 diabetes mellitus with diabetic chronic kidney disease: Secondary | ICD-10-CM | POA: Diagnosis not present

## 2016-05-27 DIAGNOSIS — D631 Anemia in chronic kidney disease: Secondary | ICD-10-CM | POA: Diagnosis not present

## 2016-05-27 DIAGNOSIS — I69354 Hemiplegia and hemiparesis following cerebral infarction affecting left non-dominant side: Secondary | ICD-10-CM | POA: Diagnosis not present

## 2016-05-27 DIAGNOSIS — L89620 Pressure ulcer of left heel, unstageable: Secondary | ICD-10-CM | POA: Diagnosis not present

## 2016-05-28 DIAGNOSIS — N183 Chronic kidney disease, stage 3 (moderate): Secondary | ICD-10-CM | POA: Diagnosis not present

## 2016-05-28 DIAGNOSIS — I129 Hypertensive chronic kidney disease with stage 1 through stage 4 chronic kidney disease, or unspecified chronic kidney disease: Secondary | ICD-10-CM | POA: Diagnosis not present

## 2016-05-28 DIAGNOSIS — L89620 Pressure ulcer of left heel, unstageable: Secondary | ICD-10-CM | POA: Diagnosis not present

## 2016-05-28 DIAGNOSIS — D631 Anemia in chronic kidney disease: Secondary | ICD-10-CM | POA: Diagnosis not present

## 2016-05-28 DIAGNOSIS — E1122 Type 2 diabetes mellitus with diabetic chronic kidney disease: Secondary | ICD-10-CM | POA: Diagnosis not present

## 2016-05-28 DIAGNOSIS — I69354 Hemiplegia and hemiparesis following cerebral infarction affecting left non-dominant side: Secondary | ICD-10-CM | POA: Diagnosis not present

## 2016-05-31 DIAGNOSIS — N183 Chronic kidney disease, stage 3 (moderate): Secondary | ICD-10-CM | POA: Diagnosis not present

## 2016-05-31 DIAGNOSIS — L89620 Pressure ulcer of left heel, unstageable: Secondary | ICD-10-CM | POA: Diagnosis not present

## 2016-05-31 DIAGNOSIS — M159 Polyosteoarthritis, unspecified: Secondary | ICD-10-CM | POA: Diagnosis not present

## 2016-05-31 DIAGNOSIS — E119 Type 2 diabetes mellitus without complications: Secondary | ICD-10-CM | POA: Diagnosis not present

## 2016-05-31 DIAGNOSIS — D631 Anemia in chronic kidney disease: Secondary | ICD-10-CM | POA: Diagnosis not present

## 2016-05-31 DIAGNOSIS — I639 Cerebral infarction, unspecified: Secondary | ICD-10-CM | POA: Diagnosis not present

## 2016-05-31 DIAGNOSIS — E1122 Type 2 diabetes mellitus with diabetic chronic kidney disease: Secondary | ICD-10-CM | POA: Diagnosis not present

## 2016-05-31 DIAGNOSIS — I129 Hypertensive chronic kidney disease with stage 1 through stage 4 chronic kidney disease, or unspecified chronic kidney disease: Secondary | ICD-10-CM | POA: Diagnosis not present

## 2016-05-31 DIAGNOSIS — I69354 Hemiplegia and hemiparesis following cerebral infarction affecting left non-dominant side: Secondary | ICD-10-CM | POA: Diagnosis not present

## 2016-06-01 DIAGNOSIS — D631 Anemia in chronic kidney disease: Secondary | ICD-10-CM | POA: Diagnosis not present

## 2016-06-01 DIAGNOSIS — I69354 Hemiplegia and hemiparesis following cerebral infarction affecting left non-dominant side: Secondary | ICD-10-CM | POA: Diagnosis not present

## 2016-06-01 DIAGNOSIS — L89620 Pressure ulcer of left heel, unstageable: Secondary | ICD-10-CM | POA: Diagnosis not present

## 2016-06-01 DIAGNOSIS — I129 Hypertensive chronic kidney disease with stage 1 through stage 4 chronic kidney disease, or unspecified chronic kidney disease: Secondary | ICD-10-CM | POA: Diagnosis not present

## 2016-06-01 DIAGNOSIS — E1122 Type 2 diabetes mellitus with diabetic chronic kidney disease: Secondary | ICD-10-CM | POA: Diagnosis not present

## 2016-06-01 DIAGNOSIS — N183 Chronic kidney disease, stage 3 (moderate): Secondary | ICD-10-CM | POA: Diagnosis not present

## 2016-06-02 DIAGNOSIS — D509 Iron deficiency anemia, unspecified: Secondary | ICD-10-CM | POA: Diagnosis not present

## 2016-06-02 DIAGNOSIS — N184 Chronic kidney disease, stage 4 (severe): Secondary | ICD-10-CM | POA: Diagnosis not present

## 2016-06-07 DIAGNOSIS — K274 Chronic or unspecified peptic ulcer, site unspecified, with hemorrhage: Secondary | ICD-10-CM | POA: Diagnosis not present

## 2016-06-08 DIAGNOSIS — L89620 Pressure ulcer of left heel, unstageable: Secondary | ICD-10-CM | POA: Diagnosis not present

## 2016-06-08 DIAGNOSIS — E1122 Type 2 diabetes mellitus with diabetic chronic kidney disease: Secondary | ICD-10-CM | POA: Diagnosis not present

## 2016-06-08 DIAGNOSIS — I700 Atherosclerosis of aorta: Secondary | ICD-10-CM | POA: Diagnosis not present

## 2016-06-08 DIAGNOSIS — D631 Anemia in chronic kidney disease: Secondary | ICD-10-CM | POA: Diagnosis not present

## 2016-06-08 DIAGNOSIS — I129 Hypertensive chronic kidney disease with stage 1 through stage 4 chronic kidney disease, or unspecified chronic kidney disease: Secondary | ICD-10-CM | POA: Diagnosis not present

## 2016-06-08 DIAGNOSIS — J189 Pneumonia, unspecified organism: Secondary | ICD-10-CM | POA: Diagnosis not present

## 2016-06-08 DIAGNOSIS — N183 Chronic kidney disease, stage 3 (moderate): Secondary | ICD-10-CM | POA: Diagnosis not present

## 2016-06-08 DIAGNOSIS — I69354 Hemiplegia and hemiparesis following cerebral infarction affecting left non-dominant side: Secondary | ICD-10-CM | POA: Diagnosis not present

## 2016-06-10 DIAGNOSIS — Z86718 Personal history of other venous thrombosis and embolism: Secondary | ICD-10-CM | POA: Diagnosis not present

## 2016-06-10 DIAGNOSIS — E119 Type 2 diabetes mellitus without complications: Secondary | ICD-10-CM | POA: Diagnosis not present

## 2016-06-10 DIAGNOSIS — K279 Peptic ulcer, site unspecified, unspecified as acute or chronic, without hemorrhage or perforation: Secondary | ICD-10-CM | POA: Diagnosis not present

## 2016-06-10 DIAGNOSIS — Z79899 Other long term (current) drug therapy: Secondary | ICD-10-CM | POA: Diagnosis not present

## 2016-06-10 DIAGNOSIS — Z8673 Personal history of transient ischemic attack (TIA), and cerebral infarction without residual deficits: Secondary | ICD-10-CM | POA: Diagnosis not present

## 2016-06-10 DIAGNOSIS — E039 Hypothyroidism, unspecified: Secondary | ICD-10-CM | POA: Diagnosis not present

## 2016-06-10 DIAGNOSIS — K259 Gastric ulcer, unspecified as acute or chronic, without hemorrhage or perforation: Secondary | ICD-10-CM | POA: Diagnosis not present

## 2016-06-11 DIAGNOSIS — L89620 Pressure ulcer of left heel, unstageable: Secondary | ICD-10-CM | POA: Diagnosis not present

## 2016-06-11 DIAGNOSIS — E1122 Type 2 diabetes mellitus with diabetic chronic kidney disease: Secondary | ICD-10-CM | POA: Diagnosis not present

## 2016-06-11 DIAGNOSIS — D631 Anemia in chronic kidney disease: Secondary | ICD-10-CM | POA: Diagnosis not present

## 2016-06-11 DIAGNOSIS — I69354 Hemiplegia and hemiparesis following cerebral infarction affecting left non-dominant side: Secondary | ICD-10-CM | POA: Diagnosis not present

## 2016-06-11 DIAGNOSIS — N183 Chronic kidney disease, stage 3 (moderate): Secondary | ICD-10-CM | POA: Diagnosis not present

## 2016-06-11 DIAGNOSIS — I129 Hypertensive chronic kidney disease with stage 1 through stage 4 chronic kidney disease, or unspecified chronic kidney disease: Secondary | ICD-10-CM | POA: Diagnosis not present

## 2016-06-14 DIAGNOSIS — N183 Chronic kidney disease, stage 3 (moderate): Secondary | ICD-10-CM | POA: Diagnosis not present

## 2016-06-14 DIAGNOSIS — L89620 Pressure ulcer of left heel, unstageable: Secondary | ICD-10-CM | POA: Diagnosis not present

## 2016-06-14 DIAGNOSIS — I129 Hypertensive chronic kidney disease with stage 1 through stage 4 chronic kidney disease, or unspecified chronic kidney disease: Secondary | ICD-10-CM | POA: Diagnosis not present

## 2016-06-14 DIAGNOSIS — D631 Anemia in chronic kidney disease: Secondary | ICD-10-CM | POA: Diagnosis not present

## 2016-06-14 DIAGNOSIS — I69354 Hemiplegia and hemiparesis following cerebral infarction affecting left non-dominant side: Secondary | ICD-10-CM | POA: Diagnosis not present

## 2016-06-14 DIAGNOSIS — E1122 Type 2 diabetes mellitus with diabetic chronic kidney disease: Secondary | ICD-10-CM | POA: Diagnosis not present

## 2016-06-15 DIAGNOSIS — I69354 Hemiplegia and hemiparesis following cerebral infarction affecting left non-dominant side: Secondary | ICD-10-CM | POA: Diagnosis not present

## 2016-06-15 DIAGNOSIS — N183 Chronic kidney disease, stage 3 (moderate): Secondary | ICD-10-CM | POA: Diagnosis not present

## 2016-06-15 DIAGNOSIS — I129 Hypertensive chronic kidney disease with stage 1 through stage 4 chronic kidney disease, or unspecified chronic kidney disease: Secondary | ICD-10-CM | POA: Diagnosis not present

## 2016-06-15 DIAGNOSIS — L89620 Pressure ulcer of left heel, unstageable: Secondary | ICD-10-CM | POA: Diagnosis not present

## 2016-06-15 DIAGNOSIS — D631 Anemia in chronic kidney disease: Secondary | ICD-10-CM | POA: Diagnosis not present

## 2016-06-15 DIAGNOSIS — E1122 Type 2 diabetes mellitus with diabetic chronic kidney disease: Secondary | ICD-10-CM | POA: Diagnosis not present

## 2016-06-16 DIAGNOSIS — I129 Hypertensive chronic kidney disease with stage 1 through stage 4 chronic kidney disease, or unspecified chronic kidney disease: Secondary | ICD-10-CM | POA: Diagnosis not present

## 2016-06-16 DIAGNOSIS — D631 Anemia in chronic kidney disease: Secondary | ICD-10-CM | POA: Diagnosis not present

## 2016-06-16 DIAGNOSIS — I69354 Hemiplegia and hemiparesis following cerebral infarction affecting left non-dominant side: Secondary | ICD-10-CM | POA: Diagnosis not present

## 2016-06-16 DIAGNOSIS — N183 Chronic kidney disease, stage 3 (moderate): Secondary | ICD-10-CM | POA: Diagnosis not present

## 2016-06-16 DIAGNOSIS — E1122 Type 2 diabetes mellitus with diabetic chronic kidney disease: Secondary | ICD-10-CM | POA: Diagnosis not present

## 2016-06-16 DIAGNOSIS — L89620 Pressure ulcer of left heel, unstageable: Secondary | ICD-10-CM | POA: Diagnosis not present

## 2016-06-17 DIAGNOSIS — L89620 Pressure ulcer of left heel, unstageable: Secondary | ICD-10-CM | POA: Diagnosis not present

## 2016-06-17 DIAGNOSIS — N183 Chronic kidney disease, stage 3 (moderate): Secondary | ICD-10-CM | POA: Diagnosis not present

## 2016-06-17 DIAGNOSIS — Z79899 Other long term (current) drug therapy: Secondary | ICD-10-CM | POA: Diagnosis not present

## 2016-06-17 DIAGNOSIS — N184 Chronic kidney disease, stage 4 (severe): Secondary | ICD-10-CM | POA: Diagnosis not present

## 2016-06-17 DIAGNOSIS — E559 Vitamin D deficiency, unspecified: Secondary | ICD-10-CM | POA: Diagnosis not present

## 2016-06-17 DIAGNOSIS — R809 Proteinuria, unspecified: Secondary | ICD-10-CM | POA: Diagnosis not present

## 2016-06-17 DIAGNOSIS — E1122 Type 2 diabetes mellitus with diabetic chronic kidney disease: Secondary | ICD-10-CM | POA: Diagnosis not present

## 2016-06-17 DIAGNOSIS — D631 Anemia in chronic kidney disease: Secondary | ICD-10-CM | POA: Diagnosis not present

## 2016-06-17 DIAGNOSIS — I129 Hypertensive chronic kidney disease with stage 1 through stage 4 chronic kidney disease, or unspecified chronic kidney disease: Secondary | ICD-10-CM | POA: Diagnosis not present

## 2016-06-17 DIAGNOSIS — I69354 Hemiplegia and hemiparesis following cerebral infarction affecting left non-dominant side: Secondary | ICD-10-CM | POA: Diagnosis not present

## 2016-06-18 DIAGNOSIS — N183 Chronic kidney disease, stage 3 (moderate): Secondary | ICD-10-CM | POA: Diagnosis not present

## 2016-06-18 DIAGNOSIS — I69354 Hemiplegia and hemiparesis following cerebral infarction affecting left non-dominant side: Secondary | ICD-10-CM | POA: Diagnosis not present

## 2016-06-18 DIAGNOSIS — L89620 Pressure ulcer of left heel, unstageable: Secondary | ICD-10-CM | POA: Diagnosis not present

## 2016-06-18 DIAGNOSIS — I129 Hypertensive chronic kidney disease with stage 1 through stage 4 chronic kidney disease, or unspecified chronic kidney disease: Secondary | ICD-10-CM | POA: Diagnosis not present

## 2016-06-18 DIAGNOSIS — D631 Anemia in chronic kidney disease: Secondary | ICD-10-CM | POA: Diagnosis not present

## 2016-06-18 DIAGNOSIS — E1122 Type 2 diabetes mellitus with diabetic chronic kidney disease: Secondary | ICD-10-CM | POA: Diagnosis not present

## 2016-06-21 DIAGNOSIS — I129 Hypertensive chronic kidney disease with stage 1 through stage 4 chronic kidney disease, or unspecified chronic kidney disease: Secondary | ICD-10-CM | POA: Diagnosis not present

## 2016-06-21 DIAGNOSIS — N183 Chronic kidney disease, stage 3 (moderate): Secondary | ICD-10-CM | POA: Diagnosis not present

## 2016-06-21 DIAGNOSIS — E1122 Type 2 diabetes mellitus with diabetic chronic kidney disease: Secondary | ICD-10-CM | POA: Diagnosis not present

## 2016-06-21 DIAGNOSIS — I69354 Hemiplegia and hemiparesis following cerebral infarction affecting left non-dominant side: Secondary | ICD-10-CM | POA: Diagnosis not present

## 2016-06-21 DIAGNOSIS — D631 Anemia in chronic kidney disease: Secondary | ICD-10-CM | POA: Diagnosis not present

## 2016-06-21 DIAGNOSIS — L89620 Pressure ulcer of left heel, unstageable: Secondary | ICD-10-CM | POA: Diagnosis not present

## 2016-06-22 DIAGNOSIS — N183 Chronic kidney disease, stage 3 (moderate): Secondary | ICD-10-CM | POA: Diagnosis not present

## 2016-06-22 DIAGNOSIS — D631 Anemia in chronic kidney disease: Secondary | ICD-10-CM | POA: Diagnosis not present

## 2016-06-22 DIAGNOSIS — I129 Hypertensive chronic kidney disease with stage 1 through stage 4 chronic kidney disease, or unspecified chronic kidney disease: Secondary | ICD-10-CM | POA: Diagnosis not present

## 2016-06-22 DIAGNOSIS — E1122 Type 2 diabetes mellitus with diabetic chronic kidney disease: Secondary | ICD-10-CM | POA: Diagnosis not present

## 2016-06-22 DIAGNOSIS — L89620 Pressure ulcer of left heel, unstageable: Secondary | ICD-10-CM | POA: Diagnosis not present

## 2016-06-22 DIAGNOSIS — I69354 Hemiplegia and hemiparesis following cerebral infarction affecting left non-dominant side: Secondary | ICD-10-CM | POA: Diagnosis not present

## 2016-06-23 DIAGNOSIS — N183 Chronic kidney disease, stage 3 (moderate): Secondary | ICD-10-CM | POA: Diagnosis not present

## 2016-06-23 DIAGNOSIS — D631 Anemia in chronic kidney disease: Secondary | ICD-10-CM | POA: Diagnosis not present

## 2016-06-23 DIAGNOSIS — I129 Hypertensive chronic kidney disease with stage 1 through stage 4 chronic kidney disease, or unspecified chronic kidney disease: Secondary | ICD-10-CM | POA: Diagnosis not present

## 2016-06-23 DIAGNOSIS — E1122 Type 2 diabetes mellitus with diabetic chronic kidney disease: Secondary | ICD-10-CM | POA: Diagnosis not present

## 2016-06-23 DIAGNOSIS — I69354 Hemiplegia and hemiparesis following cerebral infarction affecting left non-dominant side: Secondary | ICD-10-CM | POA: Diagnosis not present

## 2016-06-23 DIAGNOSIS — L89620 Pressure ulcer of left heel, unstageable: Secondary | ICD-10-CM | POA: Diagnosis not present

## 2016-06-25 DIAGNOSIS — Z299 Encounter for prophylactic measures, unspecified: Secondary | ICD-10-CM | POA: Diagnosis not present

## 2016-06-25 DIAGNOSIS — D638 Anemia in other chronic diseases classified elsewhere: Secondary | ICD-10-CM | POA: Diagnosis not present

## 2016-06-25 DIAGNOSIS — E1122 Type 2 diabetes mellitus with diabetic chronic kidney disease: Secondary | ICD-10-CM | POA: Diagnosis not present

## 2016-06-25 DIAGNOSIS — C61 Malignant neoplasm of prostate: Secondary | ICD-10-CM | POA: Diagnosis not present

## 2016-06-28 DIAGNOSIS — Z111 Encounter for screening for respiratory tuberculosis: Secondary | ICD-10-CM | POA: Diagnosis not present

## 2016-06-28 DIAGNOSIS — L89620 Pressure ulcer of left heel, unstageable: Secondary | ICD-10-CM | POA: Diagnosis not present

## 2016-06-28 DIAGNOSIS — I129 Hypertensive chronic kidney disease with stage 1 through stage 4 chronic kidney disease, or unspecified chronic kidney disease: Secondary | ICD-10-CM | POA: Diagnosis not present

## 2016-06-28 DIAGNOSIS — I69354 Hemiplegia and hemiparesis following cerebral infarction affecting left non-dominant side: Secondary | ICD-10-CM | POA: Diagnosis not present

## 2016-06-28 DIAGNOSIS — D631 Anemia in chronic kidney disease: Secondary | ICD-10-CM | POA: Diagnosis not present

## 2016-06-28 DIAGNOSIS — E1122 Type 2 diabetes mellitus with diabetic chronic kidney disease: Secondary | ICD-10-CM | POA: Diagnosis not present

## 2016-06-28 DIAGNOSIS — N183 Chronic kidney disease, stage 3 (moderate): Secondary | ICD-10-CM | POA: Diagnosis not present

## 2016-06-29 DIAGNOSIS — I129 Hypertensive chronic kidney disease with stage 1 through stage 4 chronic kidney disease, or unspecified chronic kidney disease: Secondary | ICD-10-CM | POA: Diagnosis not present

## 2016-06-29 DIAGNOSIS — E1129 Type 2 diabetes mellitus with other diabetic kidney complication: Secondary | ICD-10-CM | POA: Diagnosis not present

## 2016-06-29 DIAGNOSIS — I69354 Hemiplegia and hemiparesis following cerebral infarction affecting left non-dominant side: Secondary | ICD-10-CM | POA: Diagnosis not present

## 2016-06-29 DIAGNOSIS — I10 Essential (primary) hypertension: Secondary | ICD-10-CM | POA: Diagnosis not present

## 2016-06-29 DIAGNOSIS — L89620 Pressure ulcer of left heel, unstageable: Secondary | ICD-10-CM | POA: Diagnosis not present

## 2016-06-29 DIAGNOSIS — N183 Chronic kidney disease, stage 3 (moderate): Secondary | ICD-10-CM | POA: Diagnosis not present

## 2016-06-29 DIAGNOSIS — D631 Anemia in chronic kidney disease: Secondary | ICD-10-CM | POA: Diagnosis not present

## 2016-06-29 DIAGNOSIS — E1122 Type 2 diabetes mellitus with diabetic chronic kidney disease: Secondary | ICD-10-CM | POA: Diagnosis not present

## 2016-06-29 DIAGNOSIS — N184 Chronic kidney disease, stage 4 (severe): Secondary | ICD-10-CM | POA: Diagnosis not present

## 2016-06-29 DIAGNOSIS — R809 Proteinuria, unspecified: Secondary | ICD-10-CM | POA: Diagnosis not present

## 2016-06-29 DIAGNOSIS — E872 Acidosis: Secondary | ICD-10-CM | POA: Diagnosis not present

## 2016-06-29 DIAGNOSIS — D638 Anemia in other chronic diseases classified elsewhere: Secondary | ICD-10-CM | POA: Diagnosis not present

## 2016-06-29 DIAGNOSIS — N250 Renal osteodystrophy: Secondary | ICD-10-CM | POA: Diagnosis not present

## 2016-07-01 DIAGNOSIS — E1122 Type 2 diabetes mellitus with diabetic chronic kidney disease: Secondary | ICD-10-CM | POA: Diagnosis not present

## 2016-07-01 DIAGNOSIS — R809 Proteinuria, unspecified: Secondary | ICD-10-CM | POA: Diagnosis not present

## 2016-07-01 DIAGNOSIS — L89620 Pressure ulcer of left heel, unstageable: Secondary | ICD-10-CM | POA: Diagnosis not present

## 2016-07-01 DIAGNOSIS — E559 Vitamin D deficiency, unspecified: Secondary | ICD-10-CM | POA: Diagnosis not present

## 2016-07-01 DIAGNOSIS — N184 Chronic kidney disease, stage 4 (severe): Secondary | ICD-10-CM | POA: Diagnosis not present

## 2016-07-01 DIAGNOSIS — I129 Hypertensive chronic kidney disease with stage 1 through stage 4 chronic kidney disease, or unspecified chronic kidney disease: Secondary | ICD-10-CM | POA: Diagnosis not present

## 2016-07-01 DIAGNOSIS — D631 Anemia in chronic kidney disease: Secondary | ICD-10-CM | POA: Diagnosis not present

## 2016-07-01 DIAGNOSIS — N183 Chronic kidney disease, stage 3 (moderate): Secondary | ICD-10-CM | POA: Diagnosis not present

## 2016-07-01 DIAGNOSIS — I69354 Hemiplegia and hemiparesis following cerebral infarction affecting left non-dominant side: Secondary | ICD-10-CM | POA: Diagnosis not present

## 2016-07-01 DIAGNOSIS — Z79899 Other long term (current) drug therapy: Secondary | ICD-10-CM | POA: Diagnosis not present

## 2016-07-06 DIAGNOSIS — D631 Anemia in chronic kidney disease: Secondary | ICD-10-CM | POA: Diagnosis not present

## 2016-07-06 DIAGNOSIS — L89620 Pressure ulcer of left heel, unstageable: Secondary | ICD-10-CM | POA: Diagnosis not present

## 2016-07-06 DIAGNOSIS — N183 Chronic kidney disease, stage 3 (moderate): Secondary | ICD-10-CM | POA: Diagnosis not present

## 2016-07-06 DIAGNOSIS — I69354 Hemiplegia and hemiparesis following cerebral infarction affecting left non-dominant side: Secondary | ICD-10-CM | POA: Diagnosis not present

## 2016-07-06 DIAGNOSIS — E1122 Type 2 diabetes mellitus with diabetic chronic kidney disease: Secondary | ICD-10-CM | POA: Diagnosis not present

## 2016-07-06 DIAGNOSIS — I129 Hypertensive chronic kidney disease with stage 1 through stage 4 chronic kidney disease, or unspecified chronic kidney disease: Secondary | ICD-10-CM | POA: Diagnosis not present

## 2016-07-07 DIAGNOSIS — D631 Anemia in chronic kidney disease: Secondary | ICD-10-CM | POA: Diagnosis not present

## 2016-07-07 DIAGNOSIS — E1122 Type 2 diabetes mellitus with diabetic chronic kidney disease: Secondary | ICD-10-CM | POA: Diagnosis not present

## 2016-07-07 DIAGNOSIS — L89620 Pressure ulcer of left heel, unstageable: Secondary | ICD-10-CM | POA: Diagnosis not present

## 2016-07-07 DIAGNOSIS — I69354 Hemiplegia and hemiparesis following cerebral infarction affecting left non-dominant side: Secondary | ICD-10-CM | POA: Diagnosis not present

## 2016-07-07 DIAGNOSIS — N183 Chronic kidney disease, stage 3 (moderate): Secondary | ICD-10-CM | POA: Diagnosis not present

## 2016-07-07 DIAGNOSIS — I129 Hypertensive chronic kidney disease with stage 1 through stage 4 chronic kidney disease, or unspecified chronic kidney disease: Secondary | ICD-10-CM | POA: Diagnosis not present

## 2016-07-08 DIAGNOSIS — I69354 Hemiplegia and hemiparesis following cerebral infarction affecting left non-dominant side: Secondary | ICD-10-CM | POA: Diagnosis not present

## 2016-07-08 DIAGNOSIS — E1122 Type 2 diabetes mellitus with diabetic chronic kidney disease: Secondary | ICD-10-CM | POA: Diagnosis not present

## 2016-07-08 DIAGNOSIS — L89620 Pressure ulcer of left heel, unstageable: Secondary | ICD-10-CM | POA: Diagnosis not present

## 2016-07-08 DIAGNOSIS — E119 Type 2 diabetes mellitus without complications: Secondary | ICD-10-CM | POA: Diagnosis not present

## 2016-07-08 DIAGNOSIS — I639 Cerebral infarction, unspecified: Secondary | ICD-10-CM | POA: Diagnosis not present

## 2016-07-08 DIAGNOSIS — I129 Hypertensive chronic kidney disease with stage 1 through stage 4 chronic kidney disease, or unspecified chronic kidney disease: Secondary | ICD-10-CM | POA: Diagnosis not present

## 2016-07-08 DIAGNOSIS — D631 Anemia in chronic kidney disease: Secondary | ICD-10-CM | POA: Diagnosis not present

## 2016-07-08 DIAGNOSIS — M159 Polyosteoarthritis, unspecified: Secondary | ICD-10-CM | POA: Diagnosis not present

## 2016-07-08 DIAGNOSIS — N183 Chronic kidney disease, stage 3 (moderate): Secondary | ICD-10-CM | POA: Diagnosis not present

## 2016-07-13 DIAGNOSIS — D631 Anemia in chronic kidney disease: Secondary | ICD-10-CM | POA: Diagnosis not present

## 2016-07-13 DIAGNOSIS — L89620 Pressure ulcer of left heel, unstageable: Secondary | ICD-10-CM | POA: Diagnosis not present

## 2016-07-13 DIAGNOSIS — I69354 Hemiplegia and hemiparesis following cerebral infarction affecting left non-dominant side: Secondary | ICD-10-CM | POA: Diagnosis not present

## 2016-07-13 DIAGNOSIS — E1122 Type 2 diabetes mellitus with diabetic chronic kidney disease: Secondary | ICD-10-CM | POA: Diagnosis not present

## 2016-07-13 DIAGNOSIS — N183 Chronic kidney disease, stage 3 (moderate): Secondary | ICD-10-CM | POA: Diagnosis not present

## 2016-07-13 DIAGNOSIS — I129 Hypertensive chronic kidney disease with stage 1 through stage 4 chronic kidney disease, or unspecified chronic kidney disease: Secondary | ICD-10-CM | POA: Diagnosis not present

## 2016-07-14 DIAGNOSIS — N183 Chronic kidney disease, stage 3 (moderate): Secondary | ICD-10-CM | POA: Diagnosis not present

## 2016-07-14 DIAGNOSIS — E1122 Type 2 diabetes mellitus with diabetic chronic kidney disease: Secondary | ICD-10-CM | POA: Diagnosis not present

## 2016-07-14 DIAGNOSIS — L89620 Pressure ulcer of left heel, unstageable: Secondary | ICD-10-CM | POA: Diagnosis not present

## 2016-07-14 DIAGNOSIS — I69354 Hemiplegia and hemiparesis following cerebral infarction affecting left non-dominant side: Secondary | ICD-10-CM | POA: Diagnosis not present

## 2016-07-14 DIAGNOSIS — I129 Hypertensive chronic kidney disease with stage 1 through stage 4 chronic kidney disease, or unspecified chronic kidney disease: Secondary | ICD-10-CM | POA: Diagnosis not present

## 2016-07-14 DIAGNOSIS — D631 Anemia in chronic kidney disease: Secondary | ICD-10-CM | POA: Diagnosis not present

## 2016-07-15 DIAGNOSIS — I69354 Hemiplegia and hemiparesis following cerebral infarction affecting left non-dominant side: Secondary | ICD-10-CM | POA: Diagnosis not present

## 2016-07-15 DIAGNOSIS — I129 Hypertensive chronic kidney disease with stage 1 through stage 4 chronic kidney disease, or unspecified chronic kidney disease: Secondary | ICD-10-CM | POA: Diagnosis not present

## 2016-07-15 DIAGNOSIS — E1122 Type 2 diabetes mellitus with diabetic chronic kidney disease: Secondary | ICD-10-CM | POA: Diagnosis not present

## 2016-07-15 DIAGNOSIS — L89620 Pressure ulcer of left heel, unstageable: Secondary | ICD-10-CM | POA: Diagnosis not present

## 2016-07-15 DIAGNOSIS — D631 Anemia in chronic kidney disease: Secondary | ICD-10-CM | POA: Diagnosis not present

## 2016-07-15 DIAGNOSIS — N184 Chronic kidney disease, stage 4 (severe): Secondary | ICD-10-CM | POA: Diagnosis not present

## 2016-07-15 DIAGNOSIS — N183 Chronic kidney disease, stage 3 (moderate): Secondary | ICD-10-CM | POA: Diagnosis not present

## 2016-07-16 DIAGNOSIS — I69354 Hemiplegia and hemiparesis following cerebral infarction affecting left non-dominant side: Secondary | ICD-10-CM | POA: Diagnosis not present

## 2016-07-16 DIAGNOSIS — N183 Chronic kidney disease, stage 3 (moderate): Secondary | ICD-10-CM | POA: Diagnosis not present

## 2016-07-16 DIAGNOSIS — L89620 Pressure ulcer of left heel, unstageable: Secondary | ICD-10-CM | POA: Diagnosis not present

## 2016-07-16 DIAGNOSIS — D631 Anemia in chronic kidney disease: Secondary | ICD-10-CM | POA: Diagnosis not present

## 2016-07-16 DIAGNOSIS — E1122 Type 2 diabetes mellitus with diabetic chronic kidney disease: Secondary | ICD-10-CM | POA: Diagnosis not present

## 2016-07-16 DIAGNOSIS — I129 Hypertensive chronic kidney disease with stage 1 through stage 4 chronic kidney disease, or unspecified chronic kidney disease: Secondary | ICD-10-CM | POA: Diagnosis not present

## 2016-07-19 DIAGNOSIS — G8929 Other chronic pain: Secondary | ICD-10-CM | POA: Diagnosis not present

## 2016-07-19 DIAGNOSIS — N183 Chronic kidney disease, stage 3 (moderate): Secondary | ICD-10-CM | POA: Diagnosis not present

## 2016-07-19 DIAGNOSIS — M160 Bilateral primary osteoarthritis of hip: Secondary | ICD-10-CM | POA: Diagnosis not present

## 2016-07-19 DIAGNOSIS — I69391 Dysphagia following cerebral infarction: Secondary | ICD-10-CM | POA: Diagnosis not present

## 2016-07-19 DIAGNOSIS — M6281 Muscle weakness (generalized): Secondary | ICD-10-CM | POA: Diagnosis not present

## 2016-07-19 DIAGNOSIS — E669 Obesity, unspecified: Secondary | ICD-10-CM | POA: Diagnosis not present

## 2016-07-19 DIAGNOSIS — E1122 Type 2 diabetes mellitus with diabetic chronic kidney disease: Secondary | ICD-10-CM | POA: Diagnosis not present

## 2016-07-19 DIAGNOSIS — L89622 Pressure ulcer of left heel, stage 2: Secondary | ICD-10-CM | POA: Diagnosis not present

## 2016-07-19 DIAGNOSIS — K270 Acute peptic ulcer, site unspecified, with hemorrhage: Secondary | ICD-10-CM | POA: Diagnosis not present

## 2016-07-19 DIAGNOSIS — Z7902 Long term (current) use of antithrombotics/antiplatelets: Secondary | ICD-10-CM | POA: Diagnosis not present

## 2016-07-19 DIAGNOSIS — K219 Gastro-esophageal reflux disease without esophagitis: Secondary | ICD-10-CM | POA: Diagnosis not present

## 2016-07-19 DIAGNOSIS — I129 Hypertensive chronic kidney disease with stage 1 through stage 4 chronic kidney disease, or unspecified chronic kidney disease: Secondary | ICD-10-CM | POA: Diagnosis not present

## 2016-07-19 DIAGNOSIS — Z7984 Long term (current) use of oral hypoglycemic drugs: Secondary | ICD-10-CM | POA: Diagnosis not present

## 2016-07-19 DIAGNOSIS — I69354 Hemiplegia and hemiparesis following cerebral infarction affecting left non-dominant side: Secondary | ICD-10-CM | POA: Diagnosis not present

## 2016-07-19 DIAGNOSIS — C61 Malignant neoplasm of prostate: Secondary | ICD-10-CM | POA: Diagnosis not present

## 2016-07-19 DIAGNOSIS — D631 Anemia in chronic kidney disease: Secondary | ICD-10-CM | POA: Diagnosis not present

## 2016-07-20 DIAGNOSIS — I129 Hypertensive chronic kidney disease with stage 1 through stage 4 chronic kidney disease, or unspecified chronic kidney disease: Secondary | ICD-10-CM | POA: Diagnosis not present

## 2016-07-20 DIAGNOSIS — L89622 Pressure ulcer of left heel, stage 2: Secondary | ICD-10-CM | POA: Diagnosis not present

## 2016-07-20 DIAGNOSIS — I69354 Hemiplegia and hemiparesis following cerebral infarction affecting left non-dominant side: Secondary | ICD-10-CM | POA: Diagnosis not present

## 2016-07-20 DIAGNOSIS — E1122 Type 2 diabetes mellitus with diabetic chronic kidney disease: Secondary | ICD-10-CM | POA: Diagnosis not present

## 2016-07-20 DIAGNOSIS — I69391 Dysphagia following cerebral infarction: Secondary | ICD-10-CM | POA: Diagnosis not present

## 2016-07-20 DIAGNOSIS — N183 Chronic kidney disease, stage 3 (moderate): Secondary | ICD-10-CM | POA: Diagnosis not present

## 2016-07-21 DIAGNOSIS — E1122 Type 2 diabetes mellitus with diabetic chronic kidney disease: Secondary | ICD-10-CM | POA: Diagnosis not present

## 2016-07-21 DIAGNOSIS — I69391 Dysphagia following cerebral infarction: Secondary | ICD-10-CM | POA: Diagnosis not present

## 2016-07-21 DIAGNOSIS — N183 Chronic kidney disease, stage 3 (moderate): Secondary | ICD-10-CM | POA: Diagnosis not present

## 2016-07-21 DIAGNOSIS — I69354 Hemiplegia and hemiparesis following cerebral infarction affecting left non-dominant side: Secondary | ICD-10-CM | POA: Diagnosis not present

## 2016-07-21 DIAGNOSIS — I129 Hypertensive chronic kidney disease with stage 1 through stage 4 chronic kidney disease, or unspecified chronic kidney disease: Secondary | ICD-10-CM | POA: Diagnosis not present

## 2016-07-21 DIAGNOSIS — L89622 Pressure ulcer of left heel, stage 2: Secondary | ICD-10-CM | POA: Diagnosis not present

## 2016-07-22 DIAGNOSIS — N183 Chronic kidney disease, stage 3 (moderate): Secondary | ICD-10-CM | POA: Diagnosis not present

## 2016-07-22 DIAGNOSIS — I129 Hypertensive chronic kidney disease with stage 1 through stage 4 chronic kidney disease, or unspecified chronic kidney disease: Secondary | ICD-10-CM | POA: Diagnosis not present

## 2016-07-22 DIAGNOSIS — E1122 Type 2 diabetes mellitus with diabetic chronic kidney disease: Secondary | ICD-10-CM | POA: Diagnosis not present

## 2016-07-22 DIAGNOSIS — L89622 Pressure ulcer of left heel, stage 2: Secondary | ICD-10-CM | POA: Diagnosis not present

## 2016-07-22 DIAGNOSIS — I69391 Dysphagia following cerebral infarction: Secondary | ICD-10-CM | POA: Diagnosis not present

## 2016-07-22 DIAGNOSIS — I69354 Hemiplegia and hemiparesis following cerebral infarction affecting left non-dominant side: Secondary | ICD-10-CM | POA: Diagnosis not present

## 2016-07-25 DIAGNOSIS — Z833 Family history of diabetes mellitus: Secondary | ICD-10-CM | POA: Diagnosis not present

## 2016-07-25 DIAGNOSIS — Z8711 Personal history of peptic ulcer disease: Secondary | ICD-10-CM | POA: Diagnosis not present

## 2016-07-25 DIAGNOSIS — Z8546 Personal history of malignant neoplasm of prostate: Secondary | ICD-10-CM | POA: Diagnosis not present

## 2016-07-25 DIAGNOSIS — Z7984 Long term (current) use of oral hypoglycemic drugs: Secondary | ICD-10-CM | POA: Diagnosis not present

## 2016-07-25 DIAGNOSIS — E039 Hypothyroidism, unspecified: Secondary | ICD-10-CM | POA: Diagnosis not present

## 2016-07-25 DIAGNOSIS — R279 Unspecified lack of coordination: Secondary | ICD-10-CM | POA: Diagnosis not present

## 2016-07-25 DIAGNOSIS — Z79899 Other long term (current) drug therapy: Secondary | ICD-10-CM | POA: Diagnosis not present

## 2016-07-25 DIAGNOSIS — R739 Hyperglycemia, unspecified: Secondary | ICD-10-CM | POA: Diagnosis not present

## 2016-07-25 DIAGNOSIS — Z743 Need for continuous supervision: Secondary | ICD-10-CM | POA: Diagnosis not present

## 2016-07-25 DIAGNOSIS — E1065 Type 1 diabetes mellitus with hyperglycemia: Secondary | ICD-10-CM | POA: Diagnosis not present

## 2016-07-25 DIAGNOSIS — E1165 Type 2 diabetes mellitus with hyperglycemia: Secondary | ICD-10-CM | POA: Diagnosis not present

## 2016-07-25 DIAGNOSIS — Z8673 Personal history of transient ischemic attack (TIA), and cerebral infarction without residual deficits: Secondary | ICD-10-CM | POA: Diagnosis not present

## 2016-07-26 DIAGNOSIS — N183 Chronic kidney disease, stage 3 (moderate): Secondary | ICD-10-CM | POA: Diagnosis not present

## 2016-07-26 DIAGNOSIS — I69391 Dysphagia following cerebral infarction: Secondary | ICD-10-CM | POA: Diagnosis not present

## 2016-07-26 DIAGNOSIS — I69354 Hemiplegia and hemiparesis following cerebral infarction affecting left non-dominant side: Secondary | ICD-10-CM | POA: Diagnosis not present

## 2016-07-26 DIAGNOSIS — E1122 Type 2 diabetes mellitus with diabetic chronic kidney disease: Secondary | ICD-10-CM | POA: Diagnosis not present

## 2016-07-26 DIAGNOSIS — L89622 Pressure ulcer of left heel, stage 2: Secondary | ICD-10-CM | POA: Diagnosis not present

## 2016-07-26 DIAGNOSIS — I129 Hypertensive chronic kidney disease with stage 1 through stage 4 chronic kidney disease, or unspecified chronic kidney disease: Secondary | ICD-10-CM | POA: Diagnosis not present

## 2016-07-27 DIAGNOSIS — N183 Chronic kidney disease, stage 3 (moderate): Secondary | ICD-10-CM | POA: Diagnosis not present

## 2016-07-27 DIAGNOSIS — I129 Hypertensive chronic kidney disease with stage 1 through stage 4 chronic kidney disease, or unspecified chronic kidney disease: Secondary | ICD-10-CM | POA: Diagnosis not present

## 2016-07-27 DIAGNOSIS — L89622 Pressure ulcer of left heel, stage 2: Secondary | ICD-10-CM | POA: Diagnosis not present

## 2016-07-27 DIAGNOSIS — I69354 Hemiplegia and hemiparesis following cerebral infarction affecting left non-dominant side: Secondary | ICD-10-CM | POA: Diagnosis not present

## 2016-07-27 DIAGNOSIS — E1122 Type 2 diabetes mellitus with diabetic chronic kidney disease: Secondary | ICD-10-CM | POA: Diagnosis not present

## 2016-07-27 DIAGNOSIS — I69391 Dysphagia following cerebral infarction: Secondary | ICD-10-CM | POA: Diagnosis not present

## 2016-07-28 DIAGNOSIS — I129 Hypertensive chronic kidney disease with stage 1 through stage 4 chronic kidney disease, or unspecified chronic kidney disease: Secondary | ICD-10-CM | POA: Diagnosis not present

## 2016-07-28 DIAGNOSIS — E1122 Type 2 diabetes mellitus with diabetic chronic kidney disease: Secondary | ICD-10-CM | POA: Diagnosis not present

## 2016-07-28 DIAGNOSIS — N183 Chronic kidney disease, stage 3 (moderate): Secondary | ICD-10-CM | POA: Diagnosis not present

## 2016-07-28 DIAGNOSIS — I69391 Dysphagia following cerebral infarction: Secondary | ICD-10-CM | POA: Diagnosis not present

## 2016-07-28 DIAGNOSIS — L89622 Pressure ulcer of left heel, stage 2: Secondary | ICD-10-CM | POA: Diagnosis not present

## 2016-07-28 DIAGNOSIS — I69354 Hemiplegia and hemiparesis following cerebral infarction affecting left non-dominant side: Secondary | ICD-10-CM | POA: Diagnosis not present

## 2016-07-31 DIAGNOSIS — I69354 Hemiplegia and hemiparesis following cerebral infarction affecting left non-dominant side: Secondary | ICD-10-CM | POA: Diagnosis not present

## 2016-07-31 DIAGNOSIS — I69391 Dysphagia following cerebral infarction: Secondary | ICD-10-CM | POA: Diagnosis not present

## 2016-07-31 DIAGNOSIS — N183 Chronic kidney disease, stage 3 (moderate): Secondary | ICD-10-CM | POA: Diagnosis not present

## 2016-07-31 DIAGNOSIS — E1122 Type 2 diabetes mellitus with diabetic chronic kidney disease: Secondary | ICD-10-CM | POA: Diagnosis not present

## 2016-07-31 DIAGNOSIS — I129 Hypertensive chronic kidney disease with stage 1 through stage 4 chronic kidney disease, or unspecified chronic kidney disease: Secondary | ICD-10-CM | POA: Diagnosis not present

## 2016-07-31 DIAGNOSIS — L89622 Pressure ulcer of left heel, stage 2: Secondary | ICD-10-CM | POA: Diagnosis not present

## 2016-08-02 DIAGNOSIS — I129 Hypertensive chronic kidney disease with stage 1 through stage 4 chronic kidney disease, or unspecified chronic kidney disease: Secondary | ICD-10-CM | POA: Diagnosis not present

## 2016-08-02 DIAGNOSIS — N183 Chronic kidney disease, stage 3 (moderate): Secondary | ICD-10-CM | POA: Diagnosis not present

## 2016-08-02 DIAGNOSIS — N184 Chronic kidney disease, stage 4 (severe): Secondary | ICD-10-CM | POA: Diagnosis not present

## 2016-08-02 DIAGNOSIS — L89622 Pressure ulcer of left heel, stage 2: Secondary | ICD-10-CM | POA: Diagnosis not present

## 2016-08-02 DIAGNOSIS — I69391 Dysphagia following cerebral infarction: Secondary | ICD-10-CM | POA: Diagnosis not present

## 2016-08-02 DIAGNOSIS — E1122 Type 2 diabetes mellitus with diabetic chronic kidney disease: Secondary | ICD-10-CM | POA: Diagnosis not present

## 2016-08-02 DIAGNOSIS — I69354 Hemiplegia and hemiparesis following cerebral infarction affecting left non-dominant side: Secondary | ICD-10-CM | POA: Diagnosis not present

## 2016-08-03 DIAGNOSIS — N183 Chronic kidney disease, stage 3 (moderate): Secondary | ICD-10-CM | POA: Diagnosis not present

## 2016-08-03 DIAGNOSIS — I69354 Hemiplegia and hemiparesis following cerebral infarction affecting left non-dominant side: Secondary | ICD-10-CM | POA: Diagnosis not present

## 2016-08-03 DIAGNOSIS — I129 Hypertensive chronic kidney disease with stage 1 through stage 4 chronic kidney disease, or unspecified chronic kidney disease: Secondary | ICD-10-CM | POA: Diagnosis not present

## 2016-08-03 DIAGNOSIS — E1122 Type 2 diabetes mellitus with diabetic chronic kidney disease: Secondary | ICD-10-CM | POA: Diagnosis not present

## 2016-08-03 DIAGNOSIS — L89622 Pressure ulcer of left heel, stage 2: Secondary | ICD-10-CM | POA: Diagnosis not present

## 2016-08-03 DIAGNOSIS — I69391 Dysphagia following cerebral infarction: Secondary | ICD-10-CM | POA: Diagnosis not present

## 2016-08-05 DIAGNOSIS — S0990XA Unspecified injury of head, initial encounter: Secondary | ICD-10-CM | POA: Diagnosis not present

## 2016-08-05 DIAGNOSIS — W010XXA Fall on same level from slipping, tripping and stumbling without subsequent striking against object, initial encounter: Secondary | ICD-10-CM | POA: Diagnosis not present

## 2016-08-05 DIAGNOSIS — R680 Hypothermia, not associated with low environmental temperature: Secondary | ICD-10-CM | POA: Diagnosis not present

## 2016-08-05 DIAGNOSIS — S0003XA Contusion of scalp, initial encounter: Secondary | ICD-10-CM | POA: Diagnosis not present

## 2016-08-05 DIAGNOSIS — E119 Type 2 diabetes mellitus without complications: Secondary | ICD-10-CM | POA: Diagnosis not present

## 2016-08-05 DIAGNOSIS — Z8673 Personal history of transient ischemic attack (TIA), and cerebral infarction without residual deficits: Secondary | ICD-10-CM | POA: Diagnosis not present

## 2016-08-05 DIAGNOSIS — R229 Localized swelling, mass and lump, unspecified: Secondary | ICD-10-CM | POA: Diagnosis not present

## 2016-08-06 DIAGNOSIS — L89622 Pressure ulcer of left heel, stage 2: Secondary | ICD-10-CM | POA: Diagnosis not present

## 2016-08-06 DIAGNOSIS — I69391 Dysphagia following cerebral infarction: Secondary | ICD-10-CM | POA: Diagnosis not present

## 2016-08-06 DIAGNOSIS — I69354 Hemiplegia and hemiparesis following cerebral infarction affecting left non-dominant side: Secondary | ICD-10-CM | POA: Diagnosis not present

## 2016-08-06 DIAGNOSIS — E1122 Type 2 diabetes mellitus with diabetic chronic kidney disease: Secondary | ICD-10-CM | POA: Diagnosis not present

## 2016-08-06 DIAGNOSIS — I129 Hypertensive chronic kidney disease with stage 1 through stage 4 chronic kidney disease, or unspecified chronic kidney disease: Secondary | ICD-10-CM | POA: Diagnosis not present

## 2016-08-06 DIAGNOSIS — N183 Chronic kidney disease, stage 3 (moderate): Secondary | ICD-10-CM | POA: Diagnosis not present

## 2016-08-09 DIAGNOSIS — L89622 Pressure ulcer of left heel, stage 2: Secondary | ICD-10-CM | POA: Diagnosis not present

## 2016-08-09 DIAGNOSIS — E1122 Type 2 diabetes mellitus with diabetic chronic kidney disease: Secondary | ICD-10-CM | POA: Diagnosis not present

## 2016-08-09 DIAGNOSIS — N183 Chronic kidney disease, stage 3 (moderate): Secondary | ICD-10-CM | POA: Diagnosis not present

## 2016-08-09 DIAGNOSIS — I129 Hypertensive chronic kidney disease with stage 1 through stage 4 chronic kidney disease, or unspecified chronic kidney disease: Secondary | ICD-10-CM | POA: Diagnosis not present

## 2016-08-09 DIAGNOSIS — I69391 Dysphagia following cerebral infarction: Secondary | ICD-10-CM | POA: Diagnosis not present

## 2016-08-09 DIAGNOSIS — I69354 Hemiplegia and hemiparesis following cerebral infarction affecting left non-dominant side: Secondary | ICD-10-CM | POA: Diagnosis not present

## 2016-08-10 DIAGNOSIS — E1122 Type 2 diabetes mellitus with diabetic chronic kidney disease: Secondary | ICD-10-CM | POA: Diagnosis not present

## 2016-08-10 DIAGNOSIS — N183 Chronic kidney disease, stage 3 (moderate): Secondary | ICD-10-CM | POA: Diagnosis not present

## 2016-08-10 DIAGNOSIS — I129 Hypertensive chronic kidney disease with stage 1 through stage 4 chronic kidney disease, or unspecified chronic kidney disease: Secondary | ICD-10-CM | POA: Diagnosis not present

## 2016-08-10 DIAGNOSIS — I69354 Hemiplegia and hemiparesis following cerebral infarction affecting left non-dominant side: Secondary | ICD-10-CM | POA: Diagnosis not present

## 2016-08-10 DIAGNOSIS — I69391 Dysphagia following cerebral infarction: Secondary | ICD-10-CM | POA: Diagnosis not present

## 2016-08-10 DIAGNOSIS — L89622 Pressure ulcer of left heel, stage 2: Secondary | ICD-10-CM | POA: Diagnosis not present

## 2016-08-11 DIAGNOSIS — L89622 Pressure ulcer of left heel, stage 2: Secondary | ICD-10-CM | POA: Diagnosis not present

## 2016-08-11 DIAGNOSIS — I69354 Hemiplegia and hemiparesis following cerebral infarction affecting left non-dominant side: Secondary | ICD-10-CM | POA: Diagnosis not present

## 2016-08-11 DIAGNOSIS — I129 Hypertensive chronic kidney disease with stage 1 through stage 4 chronic kidney disease, or unspecified chronic kidney disease: Secondary | ICD-10-CM | POA: Diagnosis not present

## 2016-08-11 DIAGNOSIS — E1122 Type 2 diabetes mellitus with diabetic chronic kidney disease: Secondary | ICD-10-CM | POA: Diagnosis not present

## 2016-08-11 DIAGNOSIS — I69391 Dysphagia following cerebral infarction: Secondary | ICD-10-CM | POA: Diagnosis not present

## 2016-08-11 DIAGNOSIS — N183 Chronic kidney disease, stage 3 (moderate): Secondary | ICD-10-CM | POA: Diagnosis not present

## 2016-08-12 DIAGNOSIS — N183 Chronic kidney disease, stage 3 (moderate): Secondary | ICD-10-CM | POA: Diagnosis not present

## 2016-08-12 DIAGNOSIS — I69354 Hemiplegia and hemiparesis following cerebral infarction affecting left non-dominant side: Secondary | ICD-10-CM | POA: Diagnosis not present

## 2016-08-12 DIAGNOSIS — I129 Hypertensive chronic kidney disease with stage 1 through stage 4 chronic kidney disease, or unspecified chronic kidney disease: Secondary | ICD-10-CM | POA: Diagnosis not present

## 2016-08-12 DIAGNOSIS — E1122 Type 2 diabetes mellitus with diabetic chronic kidney disease: Secondary | ICD-10-CM | POA: Diagnosis not present

## 2016-08-12 DIAGNOSIS — L89622 Pressure ulcer of left heel, stage 2: Secondary | ICD-10-CM | POA: Diagnosis not present

## 2016-08-12 DIAGNOSIS — I69391 Dysphagia following cerebral infarction: Secondary | ICD-10-CM | POA: Diagnosis not present

## 2016-08-13 DIAGNOSIS — N183 Chronic kidney disease, stage 3 (moderate): Secondary | ICD-10-CM | POA: Diagnosis not present

## 2016-08-13 DIAGNOSIS — E1122 Type 2 diabetes mellitus with diabetic chronic kidney disease: Secondary | ICD-10-CM | POA: Diagnosis not present

## 2016-08-13 DIAGNOSIS — I69354 Hemiplegia and hemiparesis following cerebral infarction affecting left non-dominant side: Secondary | ICD-10-CM | POA: Diagnosis not present

## 2016-08-13 DIAGNOSIS — L89622 Pressure ulcer of left heel, stage 2: Secondary | ICD-10-CM | POA: Diagnosis not present

## 2016-08-13 DIAGNOSIS — I69391 Dysphagia following cerebral infarction: Secondary | ICD-10-CM | POA: Diagnosis not present

## 2016-08-13 DIAGNOSIS — Z299 Encounter for prophylactic measures, unspecified: Secondary | ICD-10-CM | POA: Diagnosis not present

## 2016-08-13 DIAGNOSIS — Z6830 Body mass index (BMI) 30.0-30.9, adult: Secondary | ICD-10-CM | POA: Diagnosis not present

## 2016-08-13 DIAGNOSIS — I129 Hypertensive chronic kidney disease with stage 1 through stage 4 chronic kidney disease, or unspecified chronic kidney disease: Secondary | ICD-10-CM | POA: Diagnosis not present

## 2016-08-13 DIAGNOSIS — K219 Gastro-esophageal reflux disease without esophagitis: Secondary | ICD-10-CM | POA: Diagnosis not present

## 2016-08-13 DIAGNOSIS — Z713 Dietary counseling and surveillance: Secondary | ICD-10-CM | POA: Diagnosis not present

## 2016-08-13 DIAGNOSIS — D638 Anemia in other chronic diseases classified elsewhere: Secondary | ICD-10-CM | POA: Diagnosis not present

## 2016-08-16 DIAGNOSIS — E1122 Type 2 diabetes mellitus with diabetic chronic kidney disease: Secondary | ICD-10-CM | POA: Diagnosis not present

## 2016-08-16 DIAGNOSIS — I129 Hypertensive chronic kidney disease with stage 1 through stage 4 chronic kidney disease, or unspecified chronic kidney disease: Secondary | ICD-10-CM | POA: Diagnosis not present

## 2016-08-16 DIAGNOSIS — N183 Chronic kidney disease, stage 3 (moderate): Secondary | ICD-10-CM | POA: Diagnosis not present

## 2016-08-16 DIAGNOSIS — L89622 Pressure ulcer of left heel, stage 2: Secondary | ICD-10-CM | POA: Diagnosis not present

## 2016-08-16 DIAGNOSIS — I69391 Dysphagia following cerebral infarction: Secondary | ICD-10-CM | POA: Diagnosis not present

## 2016-08-16 DIAGNOSIS — N184 Chronic kidney disease, stage 4 (severe): Secondary | ICD-10-CM | POA: Diagnosis not present

## 2016-08-16 DIAGNOSIS — I69354 Hemiplegia and hemiparesis following cerebral infarction affecting left non-dominant side: Secondary | ICD-10-CM | POA: Diagnosis not present

## 2016-08-17 DIAGNOSIS — N183 Chronic kidney disease, stage 3 (moderate): Secondary | ICD-10-CM | POA: Diagnosis not present

## 2016-08-17 DIAGNOSIS — N184 Chronic kidney disease, stage 4 (severe): Secondary | ICD-10-CM | POA: Diagnosis not present

## 2016-08-17 DIAGNOSIS — E1129 Type 2 diabetes mellitus with other diabetic kidney complication: Secondary | ICD-10-CM | POA: Diagnosis not present

## 2016-08-17 DIAGNOSIS — I129 Hypertensive chronic kidney disease with stage 1 through stage 4 chronic kidney disease, or unspecified chronic kidney disease: Secondary | ICD-10-CM | POA: Diagnosis not present

## 2016-08-17 DIAGNOSIS — I69354 Hemiplegia and hemiparesis following cerebral infarction affecting left non-dominant side: Secondary | ICD-10-CM | POA: Diagnosis not present

## 2016-08-17 DIAGNOSIS — I69391 Dysphagia following cerebral infarction: Secondary | ICD-10-CM | POA: Diagnosis not present

## 2016-08-17 DIAGNOSIS — R809 Proteinuria, unspecified: Secondary | ICD-10-CM | POA: Diagnosis not present

## 2016-08-17 DIAGNOSIS — L89622 Pressure ulcer of left heel, stage 2: Secondary | ICD-10-CM | POA: Diagnosis not present

## 2016-08-17 DIAGNOSIS — I10 Essential (primary) hypertension: Secondary | ICD-10-CM | POA: Diagnosis not present

## 2016-08-17 DIAGNOSIS — N250 Renal osteodystrophy: Secondary | ICD-10-CM | POA: Diagnosis not present

## 2016-08-17 DIAGNOSIS — I509 Heart failure, unspecified: Secondary | ICD-10-CM | POA: Diagnosis not present

## 2016-08-17 DIAGNOSIS — E1122 Type 2 diabetes mellitus with diabetic chronic kidney disease: Secondary | ICD-10-CM | POA: Diagnosis not present

## 2016-08-18 DIAGNOSIS — I129 Hypertensive chronic kidney disease with stage 1 through stage 4 chronic kidney disease, or unspecified chronic kidney disease: Secondary | ICD-10-CM | POA: Diagnosis not present

## 2016-08-18 DIAGNOSIS — E1122 Type 2 diabetes mellitus with diabetic chronic kidney disease: Secondary | ICD-10-CM | POA: Diagnosis not present

## 2016-08-18 DIAGNOSIS — N183 Chronic kidney disease, stage 3 (moderate): Secondary | ICD-10-CM | POA: Diagnosis not present

## 2016-08-18 DIAGNOSIS — I69354 Hemiplegia and hemiparesis following cerebral infarction affecting left non-dominant side: Secondary | ICD-10-CM | POA: Diagnosis not present

## 2016-08-18 DIAGNOSIS — L89622 Pressure ulcer of left heel, stage 2: Secondary | ICD-10-CM | POA: Diagnosis not present

## 2016-08-18 DIAGNOSIS — I69391 Dysphagia following cerebral infarction: Secondary | ICD-10-CM | POA: Diagnosis not present

## 2016-08-19 DIAGNOSIS — I69354 Hemiplegia and hemiparesis following cerebral infarction affecting left non-dominant side: Secondary | ICD-10-CM | POA: Diagnosis not present

## 2016-08-19 DIAGNOSIS — I129 Hypertensive chronic kidney disease with stage 1 through stage 4 chronic kidney disease, or unspecified chronic kidney disease: Secondary | ICD-10-CM | POA: Diagnosis not present

## 2016-08-19 DIAGNOSIS — E1122 Type 2 diabetes mellitus with diabetic chronic kidney disease: Secondary | ICD-10-CM | POA: Diagnosis not present

## 2016-08-19 DIAGNOSIS — L89622 Pressure ulcer of left heel, stage 2: Secondary | ICD-10-CM | POA: Diagnosis not present

## 2016-08-19 DIAGNOSIS — N183 Chronic kidney disease, stage 3 (moderate): Secondary | ICD-10-CM | POA: Diagnosis not present

## 2016-08-19 DIAGNOSIS — I69391 Dysphagia following cerebral infarction: Secondary | ICD-10-CM | POA: Diagnosis not present

## 2016-08-23 DIAGNOSIS — I69354 Hemiplegia and hemiparesis following cerebral infarction affecting left non-dominant side: Secondary | ICD-10-CM | POA: Diagnosis not present

## 2016-08-23 DIAGNOSIS — E1122 Type 2 diabetes mellitus with diabetic chronic kidney disease: Secondary | ICD-10-CM | POA: Diagnosis not present

## 2016-08-23 DIAGNOSIS — L89622 Pressure ulcer of left heel, stage 2: Secondary | ICD-10-CM | POA: Diagnosis not present

## 2016-08-23 DIAGNOSIS — N183 Chronic kidney disease, stage 3 (moderate): Secondary | ICD-10-CM | POA: Diagnosis not present

## 2016-08-23 DIAGNOSIS — I129 Hypertensive chronic kidney disease with stage 1 through stage 4 chronic kidney disease, or unspecified chronic kidney disease: Secondary | ICD-10-CM | POA: Diagnosis not present

## 2016-08-23 DIAGNOSIS — I69391 Dysphagia following cerebral infarction: Secondary | ICD-10-CM | POA: Diagnosis not present

## 2016-08-24 DIAGNOSIS — N183 Chronic kidney disease, stage 3 (moderate): Secondary | ICD-10-CM | POA: Diagnosis not present

## 2016-08-24 DIAGNOSIS — E1122 Type 2 diabetes mellitus with diabetic chronic kidney disease: Secondary | ICD-10-CM | POA: Diagnosis not present

## 2016-08-24 DIAGNOSIS — L89622 Pressure ulcer of left heel, stage 2: Secondary | ICD-10-CM | POA: Diagnosis not present

## 2016-08-24 DIAGNOSIS — I69354 Hemiplegia and hemiparesis following cerebral infarction affecting left non-dominant side: Secondary | ICD-10-CM | POA: Diagnosis not present

## 2016-08-24 DIAGNOSIS — I129 Hypertensive chronic kidney disease with stage 1 through stage 4 chronic kidney disease, or unspecified chronic kidney disease: Secondary | ICD-10-CM | POA: Diagnosis not present

## 2016-08-24 DIAGNOSIS — I69391 Dysphagia following cerebral infarction: Secondary | ICD-10-CM | POA: Diagnosis not present

## 2016-08-25 DIAGNOSIS — I69391 Dysphagia following cerebral infarction: Secondary | ICD-10-CM | POA: Diagnosis not present

## 2016-08-25 DIAGNOSIS — L89622 Pressure ulcer of left heel, stage 2: Secondary | ICD-10-CM | POA: Diagnosis not present

## 2016-08-25 DIAGNOSIS — N183 Chronic kidney disease, stage 3 (moderate): Secondary | ICD-10-CM | POA: Diagnosis not present

## 2016-08-25 DIAGNOSIS — I69354 Hemiplegia and hemiparesis following cerebral infarction affecting left non-dominant side: Secondary | ICD-10-CM | POA: Diagnosis not present

## 2016-08-25 DIAGNOSIS — E1122 Type 2 diabetes mellitus with diabetic chronic kidney disease: Secondary | ICD-10-CM | POA: Diagnosis not present

## 2016-08-25 DIAGNOSIS — I129 Hypertensive chronic kidney disease with stage 1 through stage 4 chronic kidney disease, or unspecified chronic kidney disease: Secondary | ICD-10-CM | POA: Diagnosis not present

## 2016-08-26 DIAGNOSIS — E1122 Type 2 diabetes mellitus with diabetic chronic kidney disease: Secondary | ICD-10-CM | POA: Diagnosis not present

## 2016-08-26 DIAGNOSIS — I69391 Dysphagia following cerebral infarction: Secondary | ICD-10-CM | POA: Diagnosis not present

## 2016-08-26 DIAGNOSIS — I129 Hypertensive chronic kidney disease with stage 1 through stage 4 chronic kidney disease, or unspecified chronic kidney disease: Secondary | ICD-10-CM | POA: Diagnosis not present

## 2016-08-26 DIAGNOSIS — N183 Chronic kidney disease, stage 3 (moderate): Secondary | ICD-10-CM | POA: Diagnosis not present

## 2016-08-26 DIAGNOSIS — I69354 Hemiplegia and hemiparesis following cerebral infarction affecting left non-dominant side: Secondary | ICD-10-CM | POA: Diagnosis not present

## 2016-08-26 DIAGNOSIS — L89622 Pressure ulcer of left heel, stage 2: Secondary | ICD-10-CM | POA: Diagnosis not present

## 2016-08-30 DIAGNOSIS — I129 Hypertensive chronic kidney disease with stage 1 through stage 4 chronic kidney disease, or unspecified chronic kidney disease: Secondary | ICD-10-CM | POA: Diagnosis not present

## 2016-08-30 DIAGNOSIS — I69354 Hemiplegia and hemiparesis following cerebral infarction affecting left non-dominant side: Secondary | ICD-10-CM | POA: Diagnosis not present

## 2016-08-30 DIAGNOSIS — E1122 Type 2 diabetes mellitus with diabetic chronic kidney disease: Secondary | ICD-10-CM | POA: Diagnosis not present

## 2016-08-30 DIAGNOSIS — I69391 Dysphagia following cerebral infarction: Secondary | ICD-10-CM | POA: Diagnosis not present

## 2016-08-30 DIAGNOSIS — N183 Chronic kidney disease, stage 3 (moderate): Secondary | ICD-10-CM | POA: Diagnosis not present

## 2016-08-30 DIAGNOSIS — L89622 Pressure ulcer of left heel, stage 2: Secondary | ICD-10-CM | POA: Diagnosis not present

## 2016-09-02 DIAGNOSIS — I129 Hypertensive chronic kidney disease with stage 1 through stage 4 chronic kidney disease, or unspecified chronic kidney disease: Secondary | ICD-10-CM | POA: Diagnosis not present

## 2016-09-02 DIAGNOSIS — I69354 Hemiplegia and hemiparesis following cerebral infarction affecting left non-dominant side: Secondary | ICD-10-CM | POA: Diagnosis not present

## 2016-09-02 DIAGNOSIS — I69391 Dysphagia following cerebral infarction: Secondary | ICD-10-CM | POA: Diagnosis not present

## 2016-09-02 DIAGNOSIS — E1122 Type 2 diabetes mellitus with diabetic chronic kidney disease: Secondary | ICD-10-CM | POA: Diagnosis not present

## 2016-09-02 DIAGNOSIS — N184 Chronic kidney disease, stage 4 (severe): Secondary | ICD-10-CM | POA: Diagnosis not present

## 2016-09-02 DIAGNOSIS — N183 Chronic kidney disease, stage 3 (moderate): Secondary | ICD-10-CM | POA: Diagnosis not present

## 2016-09-02 DIAGNOSIS — L89622 Pressure ulcer of left heel, stage 2: Secondary | ICD-10-CM | POA: Diagnosis not present

## 2016-09-03 DIAGNOSIS — N183 Chronic kidney disease, stage 3 (moderate): Secondary | ICD-10-CM | POA: Diagnosis not present

## 2016-09-03 DIAGNOSIS — E1122 Type 2 diabetes mellitus with diabetic chronic kidney disease: Secondary | ICD-10-CM | POA: Diagnosis not present

## 2016-09-03 DIAGNOSIS — I69391 Dysphagia following cerebral infarction: Secondary | ICD-10-CM | POA: Diagnosis not present

## 2016-09-03 DIAGNOSIS — L89622 Pressure ulcer of left heel, stage 2: Secondary | ICD-10-CM | POA: Diagnosis not present

## 2016-09-03 DIAGNOSIS — I129 Hypertensive chronic kidney disease with stage 1 through stage 4 chronic kidney disease, or unspecified chronic kidney disease: Secondary | ICD-10-CM | POA: Diagnosis not present

## 2016-09-03 DIAGNOSIS — I69354 Hemiplegia and hemiparesis following cerebral infarction affecting left non-dominant side: Secondary | ICD-10-CM | POA: Diagnosis not present

## 2016-09-06 DIAGNOSIS — N184 Chronic kidney disease, stage 4 (severe): Secondary | ICD-10-CM | POA: Diagnosis not present

## 2016-09-06 DIAGNOSIS — I69391 Dysphagia following cerebral infarction: Secondary | ICD-10-CM | POA: Diagnosis not present

## 2016-09-06 DIAGNOSIS — M47816 Spondylosis without myelopathy or radiculopathy, lumbar region: Secondary | ICD-10-CM | POA: Diagnosis not present

## 2016-09-06 DIAGNOSIS — E1122 Type 2 diabetes mellitus with diabetic chronic kidney disease: Secondary | ICD-10-CM | POA: Diagnosis not present

## 2016-09-06 DIAGNOSIS — I639 Cerebral infarction, unspecified: Secondary | ICD-10-CM | POA: Diagnosis not present

## 2016-09-06 DIAGNOSIS — I129 Hypertensive chronic kidney disease with stage 1 through stage 4 chronic kidney disease, or unspecified chronic kidney disease: Secondary | ICD-10-CM | POA: Diagnosis not present

## 2016-09-06 DIAGNOSIS — Z299 Encounter for prophylactic measures, unspecified: Secondary | ICD-10-CM | POA: Diagnosis not present

## 2016-09-06 DIAGNOSIS — D638 Anemia in other chronic diseases classified elsewhere: Secondary | ICD-10-CM | POA: Diagnosis not present

## 2016-09-06 DIAGNOSIS — K219 Gastro-esophageal reflux disease without esophagitis: Secondary | ICD-10-CM | POA: Diagnosis not present

## 2016-09-06 DIAGNOSIS — I69354 Hemiplegia and hemiparesis following cerebral infarction affecting left non-dominant side: Secondary | ICD-10-CM | POA: Diagnosis not present

## 2016-09-06 DIAGNOSIS — L89622 Pressure ulcer of left heel, stage 2: Secondary | ICD-10-CM | POA: Diagnosis not present

## 2016-09-06 DIAGNOSIS — N183 Chronic kidney disease, stage 3 (moderate): Secondary | ICD-10-CM | POA: Diagnosis not present

## 2016-09-06 DIAGNOSIS — I351 Nonrheumatic aortic (valve) insufficiency: Secondary | ICD-10-CM | POA: Diagnosis not present

## 2016-09-06 DIAGNOSIS — C61 Malignant neoplasm of prostate: Secondary | ICD-10-CM | POA: Diagnosis not present

## 2016-09-06 DIAGNOSIS — M109 Gout, unspecified: Secondary | ICD-10-CM | POA: Diagnosis not present

## 2016-09-06 DIAGNOSIS — Z789 Other specified health status: Secondary | ICD-10-CM | POA: Diagnosis not present

## 2016-09-07 DIAGNOSIS — N183 Chronic kidney disease, stage 3 (moderate): Secondary | ICD-10-CM | POA: Diagnosis not present

## 2016-09-07 DIAGNOSIS — L89622 Pressure ulcer of left heel, stage 2: Secondary | ICD-10-CM | POA: Diagnosis not present

## 2016-09-07 DIAGNOSIS — I129 Hypertensive chronic kidney disease with stage 1 through stage 4 chronic kidney disease, or unspecified chronic kidney disease: Secondary | ICD-10-CM | POA: Diagnosis not present

## 2016-09-07 DIAGNOSIS — I69391 Dysphagia following cerebral infarction: Secondary | ICD-10-CM | POA: Diagnosis not present

## 2016-09-07 DIAGNOSIS — I69354 Hemiplegia and hemiparesis following cerebral infarction affecting left non-dominant side: Secondary | ICD-10-CM | POA: Diagnosis not present

## 2016-09-07 DIAGNOSIS — E1122 Type 2 diabetes mellitus with diabetic chronic kidney disease: Secondary | ICD-10-CM | POA: Diagnosis not present

## 2016-09-08 DIAGNOSIS — E1140 Type 2 diabetes mellitus with diabetic neuropathy, unspecified: Secondary | ICD-10-CM | POA: Diagnosis not present

## 2016-09-08 DIAGNOSIS — I639 Cerebral infarction, unspecified: Secondary | ICD-10-CM | POA: Diagnosis not present

## 2016-09-08 DIAGNOSIS — E119 Type 2 diabetes mellitus without complications: Secondary | ICD-10-CM | POA: Diagnosis not present

## 2016-09-08 DIAGNOSIS — M159 Polyosteoarthritis, unspecified: Secondary | ICD-10-CM | POA: Diagnosis not present

## 2016-09-08 DIAGNOSIS — E1151 Type 2 diabetes mellitus with diabetic peripheral angiopathy without gangrene: Secondary | ICD-10-CM | POA: Diagnosis not present

## 2016-09-13 DIAGNOSIS — I129 Hypertensive chronic kidney disease with stage 1 through stage 4 chronic kidney disease, or unspecified chronic kidney disease: Secondary | ICD-10-CM | POA: Diagnosis not present

## 2016-09-13 DIAGNOSIS — E1122 Type 2 diabetes mellitus with diabetic chronic kidney disease: Secondary | ICD-10-CM | POA: Diagnosis not present

## 2016-09-13 DIAGNOSIS — I69354 Hemiplegia and hemiparesis following cerebral infarction affecting left non-dominant side: Secondary | ICD-10-CM | POA: Diagnosis not present

## 2016-09-13 DIAGNOSIS — I69391 Dysphagia following cerebral infarction: Secondary | ICD-10-CM | POA: Diagnosis not present

## 2016-09-13 DIAGNOSIS — N183 Chronic kidney disease, stage 3 (moderate): Secondary | ICD-10-CM | POA: Diagnosis not present

## 2016-09-13 DIAGNOSIS — L89622 Pressure ulcer of left heel, stage 2: Secondary | ICD-10-CM | POA: Diagnosis not present

## 2016-09-14 DIAGNOSIS — E1122 Type 2 diabetes mellitus with diabetic chronic kidney disease: Secondary | ICD-10-CM | POA: Diagnosis not present

## 2016-09-14 DIAGNOSIS — I69391 Dysphagia following cerebral infarction: Secondary | ICD-10-CM | POA: Diagnosis not present

## 2016-09-14 DIAGNOSIS — L89622 Pressure ulcer of left heel, stage 2: Secondary | ICD-10-CM | POA: Diagnosis not present

## 2016-09-14 DIAGNOSIS — N183 Chronic kidney disease, stage 3 (moderate): Secondary | ICD-10-CM | POA: Diagnosis not present

## 2016-09-14 DIAGNOSIS — I129 Hypertensive chronic kidney disease with stage 1 through stage 4 chronic kidney disease, or unspecified chronic kidney disease: Secondary | ICD-10-CM | POA: Diagnosis not present

## 2016-09-14 DIAGNOSIS — I69354 Hemiplegia and hemiparesis following cerebral infarction affecting left non-dominant side: Secondary | ICD-10-CM | POA: Diagnosis not present

## 2016-09-15 DIAGNOSIS — I351 Nonrheumatic aortic (valve) insufficiency: Secondary | ICD-10-CM | POA: Diagnosis not present

## 2016-09-15 DIAGNOSIS — C61 Malignant neoplasm of prostate: Secondary | ICD-10-CM | POA: Diagnosis not present

## 2016-09-15 DIAGNOSIS — I639 Cerebral infarction, unspecified: Secondary | ICD-10-CM | POA: Diagnosis not present

## 2016-09-15 DIAGNOSIS — L89622 Pressure ulcer of left heel, stage 2: Secondary | ICD-10-CM | POA: Diagnosis not present

## 2016-09-15 DIAGNOSIS — E1122 Type 2 diabetes mellitus with diabetic chronic kidney disease: Secondary | ICD-10-CM | POA: Diagnosis not present

## 2016-09-15 DIAGNOSIS — K219 Gastro-esophageal reflux disease without esophagitis: Secondary | ICD-10-CM | POA: Diagnosis not present

## 2016-09-15 DIAGNOSIS — N183 Chronic kidney disease, stage 3 (moderate): Secondary | ICD-10-CM | POA: Diagnosis not present

## 2016-09-15 DIAGNOSIS — I69354 Hemiplegia and hemiparesis following cerebral infarction affecting left non-dominant side: Secondary | ICD-10-CM | POA: Diagnosis not present

## 2016-09-15 DIAGNOSIS — Z299 Encounter for prophylactic measures, unspecified: Secondary | ICD-10-CM | POA: Diagnosis not present

## 2016-09-15 DIAGNOSIS — N184 Chronic kidney disease, stage 4 (severe): Secondary | ICD-10-CM | POA: Diagnosis not present

## 2016-09-15 DIAGNOSIS — I129 Hypertensive chronic kidney disease with stage 1 through stage 4 chronic kidney disease, or unspecified chronic kidney disease: Secondary | ICD-10-CM | POA: Diagnosis not present

## 2016-09-15 DIAGNOSIS — I69391 Dysphagia following cerebral infarction: Secondary | ICD-10-CM | POA: Diagnosis not present

## 2016-09-15 DIAGNOSIS — M109 Gout, unspecified: Secondary | ICD-10-CM | POA: Diagnosis not present

## 2016-09-15 DIAGNOSIS — Z713 Dietary counseling and surveillance: Secondary | ICD-10-CM | POA: Diagnosis not present

## 2016-09-16 DIAGNOSIS — N184 Chronic kidney disease, stage 4 (severe): Secondary | ICD-10-CM | POA: Diagnosis not present

## 2016-09-16 DIAGNOSIS — E559 Vitamin D deficiency, unspecified: Secondary | ICD-10-CM | POA: Diagnosis not present

## 2016-09-16 DIAGNOSIS — R809 Proteinuria, unspecified: Secondary | ICD-10-CM | POA: Diagnosis not present

## 2016-09-16 DIAGNOSIS — Z79899 Other long term (current) drug therapy: Secondary | ICD-10-CM | POA: Diagnosis not present

## 2016-09-22 DIAGNOSIS — Z713 Dietary counseling and surveillance: Secondary | ICD-10-CM | POA: Diagnosis not present

## 2016-09-22 DIAGNOSIS — I351 Nonrheumatic aortic (valve) insufficiency: Secondary | ICD-10-CM | POA: Diagnosis not present

## 2016-09-22 DIAGNOSIS — N184 Chronic kidney disease, stage 4 (severe): Secondary | ICD-10-CM | POA: Diagnosis not present

## 2016-09-22 DIAGNOSIS — E1122 Type 2 diabetes mellitus with diabetic chronic kidney disease: Secondary | ICD-10-CM | POA: Diagnosis not present

## 2016-09-22 DIAGNOSIS — I639 Cerebral infarction, unspecified: Secondary | ICD-10-CM | POA: Diagnosis not present

## 2016-09-22 DIAGNOSIS — C61 Malignant neoplasm of prostate: Secondary | ICD-10-CM | POA: Diagnosis not present

## 2016-09-22 DIAGNOSIS — D638 Anemia in other chronic diseases classified elsewhere: Secondary | ICD-10-CM | POA: Diagnosis not present

## 2016-09-22 DIAGNOSIS — Z299 Encounter for prophylactic measures, unspecified: Secondary | ICD-10-CM | POA: Diagnosis not present

## 2016-09-23 DIAGNOSIS — T18128A Food in esophagus causing other injury, initial encounter: Secondary | ICD-10-CM | POA: Diagnosis not present

## 2016-09-23 DIAGNOSIS — Z794 Long term (current) use of insulin: Secondary | ICD-10-CM | POA: Diagnosis not present

## 2016-09-23 DIAGNOSIS — E119 Type 2 diabetes mellitus without complications: Secondary | ICD-10-CM | POA: Diagnosis not present

## 2016-09-23 DIAGNOSIS — C61 Malignant neoplasm of prostate: Secondary | ICD-10-CM | POA: Diagnosis not present

## 2016-09-23 DIAGNOSIS — E039 Hypothyroidism, unspecified: Secondary | ICD-10-CM | POA: Diagnosis not present

## 2016-09-23 DIAGNOSIS — Z8673 Personal history of transient ischemic attack (TIA), and cerebral infarction without residual deficits: Secondary | ICD-10-CM | POA: Diagnosis not present

## 2016-09-23 DIAGNOSIS — Z79899 Other long term (current) drug therapy: Secondary | ICD-10-CM | POA: Diagnosis not present

## 2016-09-24 ENCOUNTER — Encounter (HOSPITAL_COMMUNITY): Payer: Self-pay | Admitting: Certified Registered Nurse Anesthetist

## 2016-09-24 ENCOUNTER — Encounter: Payer: Self-pay | Admitting: Gastroenterology

## 2016-09-24 ENCOUNTER — Emergency Department (HOSPITAL_COMMUNITY): Payer: Medicare Other | Admitting: Certified Registered Nurse Anesthetist

## 2016-09-24 ENCOUNTER — Ambulatory Visit (HOSPITAL_COMMUNITY)
Admission: EM | Admit: 2016-09-24 | Discharge: 2016-09-24 | Disposition: A | Discharge status: Home / Self Care | Payer: Medicare Other | Attending: Emergency Medicine | Admitting: Emergency Medicine

## 2016-09-24 ENCOUNTER — Encounter (HOSPITAL_COMMUNITY)
Admission: EM | Disposition: A | Discharge status: Home / Self Care | Payer: Self-pay | Source: Home / Self Care | Attending: Emergency Medicine

## 2016-09-24 DIAGNOSIS — Z9989 Dependence on other enabling machines and devices: Secondary | ICD-10-CM | POA: Diagnosis not present

## 2016-09-24 DIAGNOSIS — I129 Hypertensive chronic kidney disease with stage 1 through stage 4 chronic kidney disease, or unspecified chronic kidney disease: Secondary | ICD-10-CM | POA: Insufficient documentation

## 2016-09-24 DIAGNOSIS — K3189 Other diseases of stomach and duodenum: Secondary | ICD-10-CM | POA: Insufficient documentation

## 2016-09-24 DIAGNOSIS — N183 Chronic kidney disease, stage 3 (moderate): Secondary | ICD-10-CM | POA: Insufficient documentation

## 2016-09-24 DIAGNOSIS — E1122 Type 2 diabetes mellitus with diabetic chronic kidney disease: Secondary | ICD-10-CM | POA: Insufficient documentation

## 2016-09-24 DIAGNOSIS — E889 Metabolic disorder, unspecified: Secondary | ICD-10-CM | POA: Insufficient documentation

## 2016-09-24 DIAGNOSIS — T18128A Food in esophagus causing other injury, initial encounter: Secondary | ICD-10-CM

## 2016-09-24 DIAGNOSIS — Z833 Family history of diabetes mellitus: Secondary | ICD-10-CM | POA: Diagnosis not present

## 2016-09-24 DIAGNOSIS — Z7902 Long term (current) use of antithrombotics/antiplatelets: Secondary | ICD-10-CM | POA: Insufficient documentation

## 2016-09-24 DIAGNOSIS — E039 Hypothyroidism, unspecified: Secondary | ICD-10-CM | POA: Insufficient documentation

## 2016-09-24 DIAGNOSIS — M549 Dorsalgia, unspecified: Secondary | ICD-10-CM | POA: Diagnosis not present

## 2016-09-24 DIAGNOSIS — K2950 Unspecified chronic gastritis without bleeding: Secondary | ICD-10-CM | POA: Insufficient documentation

## 2016-09-24 DIAGNOSIS — G8929 Other chronic pain: Secondary | ICD-10-CM | POA: Insufficient documentation

## 2016-09-24 DIAGNOSIS — R279 Unspecified lack of coordination: Secondary | ICD-10-CM | POA: Diagnosis not present

## 2016-09-24 DIAGNOSIS — X58XXXA Exposure to other specified factors, initial encounter: Secondary | ICD-10-CM | POA: Diagnosis not present

## 2016-09-24 DIAGNOSIS — N189 Chronic kidney disease, unspecified: Secondary | ICD-10-CM | POA: Diagnosis present

## 2016-09-24 DIAGNOSIS — M109 Gout, unspecified: Secondary | ICD-10-CM | POA: Insufficient documentation

## 2016-09-24 DIAGNOSIS — Z8673 Personal history of transient ischemic attack (TIA), and cerebral infarction without residual deficits: Secondary | ICD-10-CM | POA: Diagnosis not present

## 2016-09-24 DIAGNOSIS — Z8349 Family history of other endocrine, nutritional and metabolic diseases: Secondary | ICD-10-CM | POA: Diagnosis not present

## 2016-09-24 DIAGNOSIS — E119 Type 2 diabetes mellitus without complications: Secondary | ICD-10-CM | POA: Diagnosis not present

## 2016-09-24 DIAGNOSIS — K317 Polyp of stomach and duodenum: Secondary | ICD-10-CM | POA: Diagnosis not present

## 2016-09-24 DIAGNOSIS — Z7984 Long term (current) use of oral hypoglycemic drugs: Secondary | ICD-10-CM | POA: Insufficient documentation

## 2016-09-24 DIAGNOSIS — K222 Esophageal obstruction: Secondary | ICD-10-CM | POA: Diagnosis not present

## 2016-09-24 HISTORY — PX: ESOPHAGOGASTRODUODENOSCOPY (EGD) WITH PROPOFOL: SHX5813

## 2016-09-24 LAB — CBC
HCT: 29.4 % — ABNORMAL LOW (ref 39.0–52.0)
HEMOGLOBIN: 9.3 g/dL — AB (ref 13.0–17.0)
MCH: 29.9 pg (ref 26.0–34.0)
MCHC: 31.6 g/dL (ref 30.0–36.0)
MCV: 94.5 fL (ref 78.0–100.0)
Platelets: 197 10*3/uL (ref 150–400)
RBC: 3.11 MIL/uL — ABNORMAL LOW (ref 4.22–5.81)
RDW: 14.9 % (ref 11.5–15.5)
WBC: 8 10*3/uL (ref 4.0–10.5)

## 2016-09-24 LAB — BASIC METABOLIC PANEL
Anion gap: 16 — ABNORMAL HIGH (ref 5–15)
BUN: 83 mg/dL — ABNORMAL HIGH (ref 6–20)
CHLORIDE: 95 mmol/L — AB (ref 101–111)
CO2: 29 mmol/L (ref 22–32)
Calcium: 9.8 mg/dL (ref 8.9–10.3)
Creatinine, Ser: 4.68 mg/dL — ABNORMAL HIGH (ref 0.61–1.24)
GFR calc Af Amer: 11 mL/min — ABNORMAL LOW (ref >60)
GFR calc non Af Amer: 10 mL/min — ABNORMAL LOW (ref >60)
Glucose, Bld: 166 mg/dL — ABNORMAL HIGH (ref 65–99)
Potassium: 3.7 mmol/L (ref 3.5–5.1)
Sodium: 140 mmol/L (ref 135–145)

## 2016-09-24 LAB — GLUCOSE, CAPILLARY: GLUCOSE-CAPILLARY: 91 mg/dL (ref 65–99)

## 2016-09-24 SURGERY — ESOPHAGOGASTRODUODENOSCOPY (EGD) WITH PROPOFOL
Anesthesia: Monitor Anesthesia Care

## 2016-09-24 MED ORDER — SODIUM CHLORIDE 0.9 % IV SOLN
INTRAVENOUS | Status: DC
  Administered 2016-09-24: 08:00:00 via INTRAVENOUS

## 2016-09-24 MED ORDER — SODIUM CHLORIDE 0.9 % IV BOLUS (SEPSIS)
500.0000 mL | Freq: Once | INTRAVENOUS | Status: AC
  Administered 2016-09-24: 500 mL via INTRAVENOUS

## 2016-09-24 MED ORDER — LACTATED RINGERS IV BOLUS (SEPSIS): 1000.0000 mL | Freq: Once | INTRAVENOUS | Status: DC

## 2016-09-24 MED ORDER — ONDANSETRON HCL 4 MG/2ML IJ SOLN
INTRAMUSCULAR | Status: DC | PRN
  Administered 2016-09-24: 4 mg via INTRAVENOUS

## 2016-09-24 MED ORDER — PROPOFOL 10 MG/ML IV BOLUS
INTRAVENOUS | Status: DC | PRN
  Administered 2016-09-24: 100 mg via INTRAVENOUS

## 2016-09-24 MED ORDER — PANTOPRAZOLE SODIUM 20 MG PO TBEC: 20.0000 mg | DELAYED_RELEASE_TABLET | Freq: Every day | ORAL | 2 refills | Status: DC

## 2016-09-24 MED ORDER — PROPOFOL 500 MG/50ML IV EMUL
INTRAVENOUS | Status: DC | PRN
  Administered 2016-09-24: 50 ug/kg/min via INTRAVENOUS

## 2016-09-24 MED ORDER — PHENYLEPHRINE HCL 10 MG/ML IJ SOLN
INTRAMUSCULAR | Status: DC | PRN
  Administered 2016-09-24: 80 ug via INTRAVENOUS
  Administered 2016-09-24: 160 ug via INTRAVENOUS

## 2016-09-24 MED ORDER — BUTAMBEN-TETRACAINE-BENZOCAINE 2-2-14 % EX AERO
INHALATION_SPRAY | CUTANEOUS | Status: DC | PRN
  Administered 2016-09-24: 2 via TOPICAL

## 2016-09-24 MED ORDER — PANTOPRAZOLE SODIUM 20 MG PO TBEC: 20.0000 mg | DELAYED_RELEASE_TABLET | Freq: Every day | ORAL | Status: DC

## 2016-09-24 MED ORDER — SUCCINYLCHOLINE CHLORIDE 20 MG/ML IJ SOLN
INTRAMUSCULAR | Status: DC | PRN
  Administered 2016-09-24: 80 mg via INTRAVENOUS

## 2016-09-24 MED ORDER — PHENYLEPHRINE HCL 10 MG/ML IJ SOLN
INTRAMUSCULAR | Status: DC | PRN
  Administered 2016-09-24: 80 ug/min via INTRAVENOUS

## 2016-09-24 SURGICAL SUPPLY — 15 items

## 2016-09-24 NOTE — Discharge Instructions (Signed)
YOU HAD AN ENDOSCOPIC PROCEDURE TODAY: Refer to the procedure report and other information in the discharge instructions given to you for any specific questions about what was found during the examination. If this information does not answer your questions, please call [facility] office at [phone] to clarify.  ° °YOU SHOULD EXPECT: Some feelings of bloating in the abdomen. Passage of more gas than usual. Walking can help get rid of the air that was put into your GI tract during the procedure and reduce the bloating. If you had a lower endoscopy (such as a colonoscopy or flexible sigmoidoscopy) you may notice spotting of blood in your stool or on the toilet paper. Some abdominal soreness may be present for a day or two, also. ° °DIET: Your first meal following the procedure should be a light meal and then it is ok to progress to your normal diet. A half-sandwich or bowl of soup is an example of a good first meal. Heavy or fried foods are harder to digest and may make you feel nauseous or bloated. Drink plenty of fluids but you should avoid alcoholic beverages for 24 hours. If you had a esophageal dilation, please see attached instructions for diet.  ° °ACTIVITY: Your care partner should take you home directly after the procedure. You should plan to take it easy, moving slowly for the rest of the day. You can resume normal activity the day after the procedure however YOU SHOULD NOT DRIVE, use power tools, machinery or perform tasks that involve climbing or major physical exertion for 24 hours (because of the sedation medicines used during the test).  ° °SYMPTOMS TO REPORT IMMEDIATELY: °A gastroenterologist can be reached at any hour. Please call [phone]  for any of the following symptoms:  °Following lower endoscopy (colonoscopy, flexible sigmoidoscopy) °Excessive amounts of blood in the stool  °Significant tenderness, worsening of abdominal pains  °Swelling of the abdomen that is new, acute  °Fever of 100° or  higher  °Following upper endoscopy (EGD, EUS, ERCP, esophageal dilation) °Vomiting of blood or coffee ground material  °New, significant abdominal pain  °New, significant chest pain or pain under the shoulder blades  °Painful or persistently difficult swallowing  °New shortness of breath  °Black, tarry-looking or red, bloody stools ° °FOLLOW UP:  °If any biopsies were taken you will be contacted by phone or by letter within the next 1-3 weeks. Call [phone]  if you have not heard about the biopsies in 3 weeks.  °Please also call with any specific questions about appointments or follow up tests. °YOU HAD AN ENDOSCOPIC PROCEDURE TODAY: Refer to the procedure report and other information in the discharge instructions given to you for any specific questions about what was found during the examination. If this information does not answer your questions, please call [facility] office at [phone] to clarify.  ° °YOU SHOULD EXPECT: Some feelings of bloating in the abdomen. Passage of more gas than usual. Walking can help get rid of the air that was put into your GI tract during the procedure and reduce the bloating. If you had a lower endoscopy (such as a colonoscopy or flexible sigmoidoscopy) you may notice spotting of blood in your stool or on the toilet paper. Some abdominal soreness may be present for a day or two, also. ° °DIET: Your first meal following the procedure should be a light meal and then it is ok to progress to your normal diet. A half-sandwich or bowl of soup is an example   of a good first meal. Heavy or fried foods are harder to digest and may make you feel nauseous or bloated. Drink plenty of fluids but you should avoid alcoholic beverages for 24 hours. If you had a esophageal dilation, please see attached instructions for diet.  ° °ACTIVITY: Your care partner should take you home directly after the procedure. You should plan to take it easy, moving slowly for the rest of the day. You can resume  normal activity the day after the procedure however YOU SHOULD NOT DRIVE, use power tools, machinery or perform tasks that involve climbing or major physical exertion for 24 hours (because of the sedation medicines used during the test).  ° °SYMPTOMS TO REPORT IMMEDIATELY: °A gastroenterologist can be reached at any hour. Please call [phone]  for any of the following symptoms:  °Following lower endoscopy (colonoscopy, flexible sigmoidoscopy) °Excessive amounts of blood in the stool  °Significant tenderness, worsening of abdominal pains  °Swelling of the abdomen that is new, acute  °Fever of 100° or higher  °Following upper endoscopy (EGD, EUS, ERCP, esophageal dilation) °Vomiting of blood or coffee ground material  °New, significant abdominal pain  °New, significant chest pain or pain under the shoulder blades  °Painful or persistently difficult swallowing  °New shortness of breath  °Black, tarry-looking or red, bloody stools ° °FOLLOW UP:  °If any biopsies were taken you will be contacted by phone or by letter within the next 1-3 weeks. Call [phone]  if you have not heard about the biopsies in 3 weeks.  °Please also call with any specific questions about appointments or follow up tests. ° °

## 2016-09-24 NOTE — Consult Note (Signed)
Referring Provider:  Dr. Posey Pronto  Primary Care Physician:  Glenda Chroman, MD Primary Gastroenterologist:  Althia Forts  Reason for Consultation:  Food impaction    HPI: Cameron Phelps is a 83 y.o. male  patient with past medical history of stroke with the some residual left-sided weakness, diabetes, hypertension, chronic kidney disease came into the ED with food impaction. According to patient he was eating steak yesterday evening around 6 PM and started feeling sensation of steak getting stuck in his throat. Denied any vomiting. He is able to keep down his saliva. Started to have nausea with SIPS of water.  She denied chest pain or shortness of breath. Denied any urinary symptoms. Denied cough or sputum production. Overall doing better. He is not on Plavix at this time.  Past Medical History:  Diagnosis Date  . Anemia   . Arthritis    gout  . Cancer [facility])    Prostate  . Chronic back pain   . Chronic kidney disease   . Diabetes mellitus without complication ([place])   . Gout   . Hypertension    Denies history of HTN  . Hypothyroidism   . Leg edema   . Metabolic bone disease   . PONV (postoperative nausea and vomiting)   . Proteinuria   . Stroke [facility])    residual left -sided weakness    Past Surgical History:  Procedure Laterality Date  . AV FISTULA PLACEMENT Left 04/08/2014   Procedure: ARTERIOVENOUS (AV) FISTULA CREATION;  Surgeon: Conrad Dozier, MD;  Location: [place];  Service: Vascular;  Laterality: Left;  . BASCILIC VEIN TRANSPOSITION Left 07/31/2014   Procedure: SECOND STAGE BASILIC VEIN TRANSPOSITION LEFT ARM;  Surgeon: Conrad Chillicothe, MD;  Location: [place];  Service: Vascular;  Laterality: Left;  . CATARACT EXTRACTION    . COLONOSCOPY W/ BIOPSIES AND POLYPECTOMY      Prior to Admission medications   Medication Sig Start Date End Date Taking? Authorizing Provider  allopurinol (ZYLOPRIM) 100 MG tablet Take 100 mg by mouth daily.    Historical Provider, MD  Calcium Carbonate  (CALTRATE 600 PO) Take 600 mg by mouth daily.     Historical Provider, MD  calcium carbonate (OS-CAL) 600 MG TABS tablet Take 600 mg by mouth 2 (two) times daily with a meal.    Historical Provider, MD  clopidogrel (PLAVIX) 75 MG tablet Take 75 mg by mouth daily.    Historical Provider, MD  glipiZIDE (GLUCOTROL) 5 MG tablet Take 10 mg by mouth 2 (two) times daily.     Historical Provider, MD  levothyroxine (SYNTHROID, LEVOTHROID) 88 MCG tablet Take 88 mcg by mouth daily before breakfast.    Historical Provider, MD  Omega-3 Fatty Acids (FISH OIL) 1000 MG CAPS Take 1,000 mg by mouth daily.     Historical Provider, MD  ondansetron (ZOFRAN) 4 MG tablet Take 1 tablet (4 mg total) by mouth every 8 (eight) hours as needed for nausea or vomiting. 08/01/14   Elam Dutch, MD  oxyCODONE (ROXICODONE) 5 MG immediate release tablet Take 1 tablet (5 mg total) by mouth every 4 (four) hours as needed for severe pain. Patient not taking: Reported on 09/18/2014 07/31/14   Alvia Grove, PA-C  pioglitazone (ACTOS) 30 MG tablet Take 30 mg by mouth daily.    Historical Provider, MD  promethazine (PHENERGAN) 12.5 MG suppository Place 0.5 suppositories (6.25 mg total) rectally every 6 (six) hours as needed for nausea or vomiting. 04/10/14   Conrad London,  MD  sodium bicarbonate 650 MG tablet Take 650 mg by mouth 3 (three) times daily.    Historical Provider, MD  torsemide (DEMADEX) 20 MG tablet Take 20 mg by mouth daily. 07/13/14   Historical Provider, MD    Scheduled Meds: Continuous Infusions: . sodium chloride     PRN Meds:.  Allergies as of 09/24/2016  . (No Known Allergies)    Family History  Problem Relation Age of Onset  . Diabetes Mother   . Cancer Daughter   . Diabetes Daughter   . Hyperlipidemia Daughter   . Diabetes Daughter     Social History   Social History  . Marital status: Widowed    Spouse name: N/A  . Number of children: N/A  . Years of education: N/A   Occupational History  .  Not on file.   Social History Main Topics  . Smoking status: Never Smoker  . Smokeless tobacco: Never Used  . Alcohol use No  . Drug use: No  . Sexual activity: Not on file   Other Topics Concern  . Not on file   Social History Narrative  . No narrative on file    Review of Systems: All negative except as stated above in HPI.   Physical Exam: Vital signs: Vitals:   09/24/16 0430 09/24/16 0500  BP: 114/64 122/77  Pulse: 75 75  Resp:    Temp:       General:   Alert,  Well-developed, well-nourished, pleasant and cooperative in NAD HEENT. Normocephalic, atraumatic. Extra ocular movement intact. Lungs:  Clear throughout to auscultation.   No wheezes, crackles, or rhonchi. No acute distress. Heart:  Regular rate and rhythm; no murmurs, clicks, rubs,  or gallops. Abdomen: Soft, nontender, nondistended, bowel sounds present. No peritoneal signs LE : no edema  Psych - mood and affect normal. Alert and oriented 3 Rectal:  Deferred  GI:  Lab Results:  Recent Labs  09/24/16 0422  WBC 8.0  HGB 9.3*  HCT 29.4*  PLT 197   BMET  Recent Labs  09/24/16 0422  NA 140  K 3.7  CL 95*  CO2 29  GLUCOSE 166*  BUN 83*  CREATININE 4.68*  CALCIUM 9.8   LFT No results for input(s): PROT, ALBUMIN, AST, ALT, ALKPHOS, BILITOT, BILIDIR, IBILI in the last 72 hours. PT/INR No results for input(s): LABPROT, INR in the last 72 hours.   Studies/Results: No results found.  Impression/Plan: - Food impaction - Chronic kidney disease - History of stroke. Was on Plavix. He has been taken off of Plavix since last 6 months to patient - Anemia. Most likely from chronic kidney disease.  Recommendations ------------------------- - EGD today for further evaluation and management. The risk benefits alternatives discussed with the patient. Verbalized understanding. - Further plan based on endoscopic finding.    LOS: 0 days   Otis Brace  MD, FACP 09/24/2016, 8:00 AM  Pager  [phone] If no answer or after 5 PM call [phone]

## 2016-09-24 NOTE — Anesthesia Procedure Notes (Signed)
Procedure Name: MAC Date/Time: 09/24/2016 8:38 AM Performed by: Carney Living Pre-anesthesia Checklist: Patient identified, Emergency Drugs available, Suction available, Patient being monitored and Timeout performed Patient Re-evaluated:Patient Re-evaluated prior to inductionOxygen Delivery Method: Nasal cannula Preoxygenation: Pre-oxygenation with 100% oxygen

## 2016-09-24 NOTE — Anesthesia Postprocedure Evaluation (Addendum)
Anesthesia Post Note  Patient: Cameron Phelps  Procedure(s) Performed: Procedure(s) (LRB): ESOPHAGOGASTRODUODENOSCOPY (EGD) WITH PROPOFOL (N/A)  Patient location during evaluation: PACU Anesthesia Type: MAC Level of consciousness: awake Pain management: pain level controlled Vital Signs Assessment: post-procedure vital signs reviewed and stable Respiratory status: spontaneous breathing Cardiovascular status: stable Postop Assessment: no signs of nausea or vomiting Anesthetic complications: no        Last Vitals:  Vitals:   09/24/16 0939 09/24/16 0940  BP: (!) 106/48 (!) 106/48  Pulse: 61 61  Resp: 18 18  Temp: 36.4 C     Last Pain:  Vitals:   09/24/16 0939  TempSrc: Oral  PainSc:    Pain Goal:                 [name]

## 2016-09-24 NOTE — Anesthesia Preprocedure Evaluation (Addendum)
Anesthesia Evaluation  Patient identified by MRN, date of birth, ID band Patient awake    Reviewed: Allergy & Precautions, NPO status , Patient's Chart, lab work & pertinent test results  History of Anesthesia Complications (+) PONV  Airway Mallampati: II  TM Distance: >3 FB Neck ROM: Full    Dental  (+) Edentulous Upper, Edentulous Lower, Dental Advisory Given   Pulmonary    Pulmonary exam normal        Cardiovascular hypertension, Pt. on medications Normal cardiovascular exam     Neuro/Psych CVA, Residual Symptoms    GI/Hepatic   Endo/Other  diabetes, Well Controlled, Type 2, Insulin Dependent, Oral Hypoglycemic AgentsHypothyroidism   Renal/GU Renal InsufficiencyRenal disease     Musculoskeletal  (+) Arthritis , Osteoarthritis,    Abdominal Normal abdominal exam  (+)   Peds  Hematology  (+) anemia ,   Anesthesia Other Findings residual left -sided weakness  Reproductive/Obstetrics                            Anesthesia Physical Anesthesia Plan  ASA: III  Anesthesia Plan: General   Post-op Pain Management:    Induction:   Airway Management Planned: Nasal Cannula, Natural Airway and Simple Face Mask  Additional Equipment:   Intra-op Plan:   Post-operative Plan:   Informed Consent: I have reviewed the patients History and Physical, chart, labs and discussed the procedure including the risks, benefits and alternatives for the proposed anesthesia with the patient or authorized representative who has indicated his/her understanding and acceptance.   Dental advisory given  Plan Discussed with: CRNA, Anesthesiologist and Surgeon  Anesthesia Plan Comments:      Anesthesia Quick Evaluation

## 2016-09-24 NOTE — Op Note (Signed)
[facility] Patient Name: Cameron Phelps Procedure Date : 09/24/2016 MRN: [id number] Attending MD: Otis Brace , MD Date of Birth: [birth year] CSN: [id number] Age: 83 Admit Type: Inpatient Procedure:                Upper GI endoscopy Indications:              Removal of foreign body in the esophagus,                            Therapeutic procedure, food impaction Providers:                Otis Brace, MD, Dortha Schwalbe RN, RN,                            Cherylynn Ridges, Technician, Kerrie Pleasure, CRNA Referring MD:              Medicines:                General Anesthesia Complications:            No immediate complications. Estimated Blood Loss:     Estimated blood loss was minimal. Procedure:                Pre-Anesthesia Assessment:                           - Prior to the procedure, a History and Physical                            was performed, and patient medications and                            allergies were reviewed. The patient's tolerance of                            previous anesthesia was also reviewed. The risks                            and benefits of the procedure and the sedation                            options and risks were discussed with the patient.                            All questions were answered, and informed consent                            was obtained. Prior Anticoagulants: The patient has                            taken no previous anticoagulant or antiplatelet                            agents. ASA Grade Assessment: II - A patient with  mild systemic disease. After reviewing the risks                            and benefits, the patient was deemed in                            satisfactory condition to undergo the procedure.                           After obtaining informed consent, the endoscope was                            passed under direct vision. Throughout the            procedure, the patient's blood pressure, pulse, and                            oxygen saturations were monitored continuously. The                            [id number] ([id number]) scope was introduced through the                            mouth, and advanced to the second part of duodenum.                            The upper GI endoscopy was technically difficult                            and complex due to presence of food. Successful                            completion of the procedure was aided by general                            intubation. The patient tolerated the procedure                            well. Scope In: Scope Out: Findings:      Food was found in the proximal esophagus right at upper esophagus       sphincter. I was not able to advance scope beyond this area.Patient was       subsequently intubated for airway protection. After intubation,initial       attempt were made with tolen grasper without any success. after that,       some part of food bolus was removed with rat tooth. removing part of the       food bolus allowed [place] to advance scope into the esophagus. Scope was       advanced into the stomach. There is no stricture or narrowing of the       remainder of the esophagus noted. Subsequently food bolus was pushed       down into the stomach. GE junction appeared normal without any narrowing       . I did not see any stricture at the upper  esophagus sphincter.      there was polypoid and inflammatory appearing area was found in the       prepyloric region of the stomach. Biopsies were taken with a cold       forceps for histology.      The cardia and gastric fundus were normal on retroflexion. Random       gastric biopsies were taken to rule out H. pylori.      The duodenal bulb, first portion of the duodenum and second portion of       the duodenum were normal. Impression:               - Food in the proximal esophagus.                           -  Two gastric polyps. Biopsied.                           - Normal duodenal bulb, first portion of the                            duodenum and second portion of the duodenum. Moderate Sedation:      Moderate (conscious) sedation was personally administered by an       anesthesia professional. The following parameters were monitored: oxygen       saturation, heart rate, blood pressure, and response to care.patient was       subsequently intubated and received general anesthesia. [Procedure       Duration Time]. Recommendation:           - Patient has a contact number available for                            emergencies. The signs and symptoms of potential                            delayed complications were discussed with the                            patient. Return to normal activities tomorrow.                            Written discharge instructions were provided to the                            patient.                           - Mechanical soft diet.                           - Continue present medications.                           - Await pathology results.                           - Return to my office in 4 weeks.                           -  Discharge patient to home (ambulatory). Procedure Code(s):        --- Professional ---                           [phone], Esophagogastroduodenoscopy, flexible,                            transoral; with biopsy, single or multiple Diagnosis Code(s):        --- Professional ---                           L79.892J, Food in esophagus causing other injury,                            initial encounter                           K31.7, Polyp of stomach and duodenum                           T18.108A, Unspecified foreign body in esophagus                            causing other injury, initial encounter CPT copyright 2016 [employer]. All rights reserved. The codes documented in this report are preliminary and upon coder review may   be revised to meet current compliance requirements. Otis Brace, MD Otis Brace, MD 09/24/2016 9:47:33 AM Number of Addenda: 0

## 2016-09-24 NOTE — ED Triage Notes (Addendum)
Pt transfer from [facility] to get GI procedure. Pt went in w/ c/o steak being stuck in his throat. Rates pain @ 9/10 in throat. RR even and unlabored. NAD noted. A&O x4.

## 2016-09-24 NOTE — Anesthesia Procedure Notes (Signed)
Procedure Name: Intubation Date/Time: 09/24/2016 8:56 AM Performed by: Carney Living Pre-anesthesia Checklist: Patient identified, Emergency Drugs available, Suction available, Patient being monitored and Timeout performed Patient Re-evaluated:Patient Re-evaluated prior to inductionOxygen Delivery Method: Circle system utilized Preoxygenation: Pre-oxygenation with 100% oxygen Intubation Type: IV induction Ventilation: Mask ventilation without difficulty and Oral airway inserted - appropriate to patient size Laryngoscope Size: Mac and 4 Grade View: Grade I Tube type: Oral Tube size: 7.5 mm Number of attempts: 1 Airway Equipment and Method: Stylet Placement Confirmation: ETT inserted through vocal cords under direct vision,  positive ETCO2 and breath sounds checked- equal and bilateral Secured at: 21 cm Tube secured with: Tape Dental Injury: Teeth and Oropharynx as per pre-operative assessment

## 2016-09-24 NOTE — Transfer of Care (Signed)
Immediate Anesthesia Transfer of Care Note  Patient: Cameron Phelps  Procedure(s) Performed: Procedure(s): ESOPHAGOGASTRODUODENOSCOPY (EGD) WITH PROPOFOL (N/A)  Patient Location: Endoscopy Unit  Anesthesia Type:General  Level of Consciousness: awake, alert , oriented and patient cooperative  Airway & Oxygen Therapy: Patient Spontanous Breathing and Patient connected to nasal cannula oxygen  Post-op Assessment: Report given to RN, Post -op Vital signs reviewed and stable and Patient moving all extremities X 4  Post vital signs: Reviewed and stable  Last Vitals:  Vitals:   09/24/16 0939 09/24/16 0940  BP: (!) 106/48 (!) 106/48  Pulse: 61 61  Resp: 18 18  Temp:      Last Pain:  Vitals:   09/24/16 0939  TempSrc: Oral  PainSc:          Complications: No apparent anesthesia complications

## 2016-09-24 NOTE — Addendum Note (Signed)
Addendum  created 09/24/16 1040 by Carney Living, CRNA   Charge Capture section accepted

## 2016-09-24 NOTE — ED Provider Notes (Signed)
[facility] DEPT Provider Note   CSN: [id number] Arrival date & time: 09/24/16  0340   By signing my name below, I, Delton Prairie, attest that this documentation has been prepared under the direction and in the presence of Varney Biles, MD  Electronically Signed: Delton Prairie, ED Scribe. 09/24/16. 4:14 AM.   History   Chief Complaint Chief Complaint  Patient presents with  . Food Stuck in Throat    The history is provided by the patient. No language interpreter was used.   HPI Comments:  Cameron Phelps is a 83 y.o. male who presents to the Emergency Department complaining of a foreign body which became stuck in his throat 3 hours PTA. Pt states he was eating steak and feels like a piece is stuck. He notes he drank ginger ale and states he was not able to keep it down. No alleviating factors noted. Pt denies a hx of similar symptoms or any other associated symptoms. No other complaints noted.   Past Medical History:  Diagnosis Date  . Anemia   . Arthritis    gout  . Cancer [facility])    Prostate  . Chronic back pain   . Chronic kidney disease   . Diabetes mellitus without complication ([place])   . Gout   . Hypertension    Denies history of HTN  . Hypothyroidism   . Leg edema   . Metabolic bone disease   . PONV (postoperative nausea and vomiting)   . Proteinuria   . Stroke [facility])    residual left -sided weakness    Patient Active Problem List   Diagnosis Date Noted  . CKD (chronic kidney disease), stage III 04/02/2014    Past Surgical History:  Procedure Laterality Date  . AV FISTULA PLACEMENT Left 04/08/2014   Procedure: ARTERIOVENOUS (AV) FISTULA CREATION;  Surgeon: Conrad [place], MD;  Location: [place];  Service: Vascular;  Laterality: Left;  . BASCILIC VEIN TRANSPOSITION Left 07/31/2014   Procedure: SECOND STAGE BASILIC VEIN TRANSPOSITION LEFT ARM;  Surgeon: Conrad [place], MD;  Location: [place];  Service: Vascular;  Laterality: Left;  . CATARACT EXTRACTION    .  COLONOSCOPY W/ BIOPSIES AND POLYPECTOMY    . ESOPHAGOGASTRODUODENOSCOPY (EGD) WITH PROPOFOL N/A 09/24/2016   Procedure: ESOPHAGOGASTRODUODENOSCOPY (EGD) WITH PROPOFOL;  Surgeon: Otis Brace, MD;  Location: [place];  Service: Gastroenterology;  Laterality: N/A;     Home Medications    Prior to Admission medications   Medication Sig Start Date End Date Taking? Authorizing Provider  allopurinol (ZYLOPRIM) 100 MG tablet Take 100 mg by mouth daily.    Historical Provider, MD  Calcium Carbonate (CALTRATE 600 PO) Take 600 mg by mouth daily.     Historical Provider, MD  calcium carbonate (OS-CAL) 600 MG TABS tablet Take 600 mg by mouth 2 (two) times daily with a meal.    Historical Provider, MD  clopidogrel (PLAVIX) 75 MG tablet Take 75 mg by mouth daily.    Historical Provider, MD  glipiZIDE (GLUCOTROL) 5 MG tablet Take 10 mg by mouth 2 (two) times daily.     Historical Provider, MD  levothyroxine (SYNTHROID, LEVOTHROID) 88 MCG tablet Take 88 mcg by mouth daily before breakfast.    Historical Provider, MD  Omega-3 Fatty Acids (FISH OIL) 1000 MG CAPS Take 1,000 mg by mouth daily.     Historical Provider, MD  ondansetron (ZOFRAN) 4 MG tablet Take 1 tablet (4 mg total) by mouth every 8 (eight) hours as needed for nausea or  vomiting. 08/01/14   Elam Dutch, MD  oxyCODONE (ROXICODONE) 5 MG immediate release tablet Take 1 tablet (5 mg total) by mouth every 4 (four) hours as needed for severe pain. Patient not taking: Reported on 09/18/2014 07/31/14   Alvia Grove, PA-C  pantoprazole (PROTONIX) 20 MG tablet Take 1 tablet (20 mg total) by mouth daily. 09/24/16   Otis Brace, MD  pioglitazone (ACTOS) 30 MG tablet Take 30 mg by mouth daily.    Historical Provider, MD  promethazine (PHENERGAN) 12.5 MG suppository Place 0.5 suppositories (6.25 mg total) rectally every 6 (six) hours as needed for nausea or vomiting. 04/10/14   Conrad [place], MD  sodium bicarbonate 650 MG tablet Take 650 mg by  mouth 3 (three) times daily.    Historical Provider, MD  torsemide (DEMADEX) 20 MG tablet Take 20 mg by mouth daily. 07/13/14   Historical Provider, MD    Family History Family History  Problem Relation Age of Onset  . Diabetes Mother   . Cancer Daughter   . Diabetes Daughter   . Hyperlipidemia Daughter   . Diabetes Daughter     Social History Social History  Substance Use Topics  . Smoking status: Never Smoker  . Smokeless tobacco: Never Used  . Alcohol use No     Allergies   Patient has no known allergies.   Review of Systems Review of Systems 10 systems reviewed and all are negative for acute change except as noted in the HPI.  Physical Exam Updated Vital Signs BP (!) 116/58   Pulse 66   Temp 97.5 F (36.4 C) (Oral)   Resp 16   Ht 5\' 9"  (1.753 m)   Wt 205 lb (93 kg)   SpO2 94%   BMI 30.27 kg/m   Physical Exam  Constitutional: He is oriented to person, place, and time. He appears well-developed and well-nourished.  HENT:  Head: Normocephalic.  Eyes: EOM are normal.  Neck: Normal range of motion.  Pulmonary/Chest: Effort normal.  Abdominal: He exhibits no distension.  Musculoskeletal: Normal range of motion.  Neurological: He is alert and oriented to person, place, and time.  Psychiatric: He has a normal mood and affect.  Nursing note and vitals reviewed.   ED Treatments / Results  Labs (all labs ordered are listed, but only abnormal results are displayed) Labs Reviewed  CBC - Abnormal; Notable for the following:       Result Value   RBC 3.11 (*)    Hemoglobin 9.3 (*)    HCT 29.4 (*)    All other components within normal limits  BASIC METABOLIC PANEL - Abnormal; Notable for the following:    Chloride 95 (*)    Glucose, Bld 166 (*)    BUN 83 (*)    Creatinine, Ser 4.68 (*)    GFR calc non Af Amer 10 (*)    GFR calc Af Amer 11 (*)    Anion gap 16 (*)    All other components within normal limits  GLUCOSE, CAPILLARY  SURGICAL PATHOLOGY     EKG  EKG Interpretation None       Radiology No results found.  Procedures Procedures (including critical care time)  Medications Ordered in ED Medications  sodium chloride 0.9 % bolus 500 mL (0 mLs Intravenous Stopped 09/24/16 0720)     Initial Impression / Assessment and Plan / ED Course  I have reviewed the triage vital signs and the nursing notes.  Pertinent labs & imaging results  that were available during my care of the patient were reviewed by me and considered in my medical decision making (see chart for details).     Pt likely has esophageal food impaction and so GI has been consulted.  Final Clinical Impressions(s) / ED Diagnoses   Final diagnoses:  Food impaction of esophagus, initial encounter  Chronic kidney disease, unspecified CKD stage    New Prescriptions Discharge Medication List as of 09/24/2016 11:05 AM    START taking these medications   Details  pantoprazole (PROTONIX) 20 MG tablet Take 1 tablet (20 mg total) by mouth daily., Starting Fri 09/24/2016, Normal       I personally performed the services described in this documentation, which was scribed in my presence. The recorded information has been reviewed and is accurate.    Varney Biles, MD 09/27/16 [phone]

## 2016-09-26 ENCOUNTER — Encounter (HOSPITAL_COMMUNITY): Payer: Self-pay | Admitting: Gastroenterology

## 2016-09-30 DIAGNOSIS — D631 Anemia in chronic kidney disease: Secondary | ICD-10-CM | POA: Diagnosis not present

## 2016-09-30 DIAGNOSIS — Z79899 Other long term (current) drug therapy: Secondary | ICD-10-CM | POA: Diagnosis not present

## 2016-09-30 DIAGNOSIS — E559 Vitamin D deficiency, unspecified: Secondary | ICD-10-CM | POA: Diagnosis not present

## 2016-09-30 DIAGNOSIS — R809 Proteinuria, unspecified: Secondary | ICD-10-CM | POA: Diagnosis not present

## 2016-09-30 DIAGNOSIS — N184 Chronic kidney disease, stage 4 (severe): Secondary | ICD-10-CM | POA: Diagnosis not present

## 2016-10-07 DIAGNOSIS — E1122 Type 2 diabetes mellitus with diabetic chronic kidney disease: Secondary | ICD-10-CM | POA: Diagnosis not present

## 2016-10-07 DIAGNOSIS — Z299 Encounter for prophylactic measures, unspecified: Secondary | ICD-10-CM | POA: Diagnosis not present

## 2016-10-07 DIAGNOSIS — I639 Cerebral infarction, unspecified: Secondary | ICD-10-CM | POA: Diagnosis not present

## 2016-10-07 DIAGNOSIS — C61 Malignant neoplasm of prostate: Secondary | ICD-10-CM | POA: Diagnosis not present

## 2016-10-07 DIAGNOSIS — N184 Chronic kidney disease, stage 4 (severe): Secondary | ICD-10-CM | POA: Diagnosis not present

## 2016-10-11 DIAGNOSIS — Z8673 Personal history of transient ischemic attack (TIA), and cerebral infarction without residual deficits: Secondary | ICD-10-CM | POA: Diagnosis not present

## 2016-10-11 DIAGNOSIS — N189 Chronic kidney disease, unspecified: Secondary | ICD-10-CM | POA: Diagnosis not present

## 2016-10-11 DIAGNOSIS — R1314 Dysphagia, pharyngoesophageal phase: Secondary | ICD-10-CM | POA: Diagnosis not present

## 2016-10-11 DIAGNOSIS — D649 Anemia, unspecified: Secondary | ICD-10-CM | POA: Diagnosis not present

## 2016-10-12 DIAGNOSIS — I10 Essential (primary) hypertension: Secondary | ICD-10-CM | POA: Diagnosis not present

## 2016-10-12 DIAGNOSIS — N184 Chronic kidney disease, stage 4 (severe): Secondary | ICD-10-CM | POA: Diagnosis not present

## 2016-10-12 DIAGNOSIS — E1129 Type 2 diabetes mellitus with other diabetic kidney complication: Secondary | ICD-10-CM | POA: Diagnosis not present

## 2016-10-12 DIAGNOSIS — R809 Proteinuria, unspecified: Secondary | ICD-10-CM | POA: Diagnosis not present

## 2016-10-12 DIAGNOSIS — E872 Acidosis: Secondary | ICD-10-CM | POA: Diagnosis not present

## 2016-10-12 DIAGNOSIS — I509 Heart failure, unspecified: Secondary | ICD-10-CM | POA: Diagnosis not present

## 2016-10-14 DIAGNOSIS — D631 Anemia in chronic kidney disease: Secondary | ICD-10-CM | POA: Diagnosis not present

## 2016-10-14 DIAGNOSIS — N184 Chronic kidney disease, stage 4 (severe): Secondary | ICD-10-CM | POA: Diagnosis not present

## 2016-10-28 DIAGNOSIS — D631 Anemia in chronic kidney disease: Secondary | ICD-10-CM | POA: Diagnosis not present

## 2016-10-28 DIAGNOSIS — N184 Chronic kidney disease, stage 4 (severe): Secondary | ICD-10-CM | POA: Diagnosis not present

## 2016-11-02 DIAGNOSIS — M159 Polyosteoarthritis, unspecified: Secondary | ICD-10-CM | POA: Diagnosis not present

## 2016-11-02 DIAGNOSIS — I639 Cerebral infarction, unspecified: Secondary | ICD-10-CM | POA: Diagnosis not present

## 2016-11-02 DIAGNOSIS — E119 Type 2 diabetes mellitus without complications: Secondary | ICD-10-CM | POA: Diagnosis not present

## 2016-11-11 DIAGNOSIS — N184 Chronic kidney disease, stage 4 (severe): Secondary | ICD-10-CM | POA: Diagnosis not present

## 2016-11-11 DIAGNOSIS — E559 Vitamin D deficiency, unspecified: Secondary | ICD-10-CM | POA: Diagnosis not present

## 2016-11-11 DIAGNOSIS — R809 Proteinuria, unspecified: Secondary | ICD-10-CM | POA: Diagnosis not present

## 2016-11-11 DIAGNOSIS — D631 Anemia in chronic kidney disease: Secondary | ICD-10-CM | POA: Diagnosis not present

## 2016-11-11 DIAGNOSIS — Z79899 Other long term (current) drug therapy: Secondary | ICD-10-CM | POA: Diagnosis not present

## 2016-11-18 DIAGNOSIS — Z299 Encounter for prophylactic measures, unspecified: Secondary | ICD-10-CM | POA: Diagnosis not present

## 2016-11-18 DIAGNOSIS — R5383 Other fatigue: Secondary | ICD-10-CM | POA: Diagnosis not present

## 2016-11-18 DIAGNOSIS — Z1389 Encounter for screening for other disorder: Secondary | ICD-10-CM | POA: Diagnosis not present

## 2016-11-18 DIAGNOSIS — Z79899 Other long term (current) drug therapy: Secondary | ICD-10-CM | POA: Diagnosis not present

## 2016-11-18 DIAGNOSIS — I639 Cerebral infarction, unspecified: Secondary | ICD-10-CM | POA: Diagnosis not present

## 2016-11-18 DIAGNOSIS — C61 Malignant neoplasm of prostate: Secondary | ICD-10-CM | POA: Diagnosis not present

## 2016-11-18 DIAGNOSIS — N184 Chronic kidney disease, stage 4 (severe): Secondary | ICD-10-CM | POA: Diagnosis not present

## 2016-11-18 DIAGNOSIS — I351 Nonrheumatic aortic (valve) insufficiency: Secondary | ICD-10-CM | POA: Diagnosis not present

## 2016-11-18 DIAGNOSIS — K219 Gastro-esophageal reflux disease without esophagitis: Secondary | ICD-10-CM | POA: Diagnosis not present

## 2016-11-18 DIAGNOSIS — Z0000 Encounter for general adult medical examination without abnormal findings: Secondary | ICD-10-CM | POA: Diagnosis not present

## 2016-11-18 DIAGNOSIS — Z7189 Other specified counseling: Secondary | ICD-10-CM | POA: Diagnosis not present

## 2016-11-18 DIAGNOSIS — Z6830 Body mass index (BMI) 30.0-30.9, adult: Secondary | ICD-10-CM | POA: Diagnosis not present

## 2016-11-24 DIAGNOSIS — E1140 Type 2 diabetes mellitus with diabetic neuropathy, unspecified: Secondary | ICD-10-CM | POA: Diagnosis not present

## 2016-11-24 DIAGNOSIS — E1151 Type 2 diabetes mellitus with diabetic peripheral angiopathy without gangrene: Secondary | ICD-10-CM | POA: Diagnosis not present

## 2016-11-25 DIAGNOSIS — E559 Vitamin D deficiency, unspecified: Secondary | ICD-10-CM | POA: Diagnosis not present

## 2016-11-25 DIAGNOSIS — R809 Proteinuria, unspecified: Secondary | ICD-10-CM | POA: Diagnosis not present

## 2016-11-25 DIAGNOSIS — D631 Anemia in chronic kidney disease: Secondary | ICD-10-CM | POA: Diagnosis not present

## 2016-11-25 DIAGNOSIS — Z79899 Other long term (current) drug therapy: Secondary | ICD-10-CM | POA: Diagnosis not present

## 2016-11-25 DIAGNOSIS — N184 Chronic kidney disease, stage 4 (severe): Secondary | ICD-10-CM | POA: Diagnosis not present

## 2016-12-07 DIAGNOSIS — E119 Type 2 diabetes mellitus without complications: Secondary | ICD-10-CM | POA: Diagnosis not present

## 2016-12-07 DIAGNOSIS — I639 Cerebral infarction, unspecified: Secondary | ICD-10-CM | POA: Diagnosis not present

## 2016-12-07 DIAGNOSIS — M159 Polyosteoarthritis, unspecified: Secondary | ICD-10-CM | POA: Diagnosis not present

## 2016-12-09 DIAGNOSIS — D631 Anemia in chronic kidney disease: Secondary | ICD-10-CM | POA: Diagnosis not present

## 2016-12-09 DIAGNOSIS — R809 Proteinuria, unspecified: Secondary | ICD-10-CM | POA: Diagnosis not present

## 2016-12-09 DIAGNOSIS — E559 Vitamin D deficiency, unspecified: Secondary | ICD-10-CM | POA: Diagnosis not present

## 2016-12-09 DIAGNOSIS — N184 Chronic kidney disease, stage 4 (severe): Secondary | ICD-10-CM | POA: Diagnosis not present

## 2016-12-09 DIAGNOSIS — Z79899 Other long term (current) drug therapy: Secondary | ICD-10-CM | POA: Diagnosis not present

## 2016-12-14 DIAGNOSIS — E872 Acidosis: Secondary | ICD-10-CM | POA: Diagnosis not present

## 2016-12-14 DIAGNOSIS — I10 Essential (primary) hypertension: Secondary | ICD-10-CM | POA: Diagnosis not present

## 2016-12-14 DIAGNOSIS — R809 Proteinuria, unspecified: Secondary | ICD-10-CM | POA: Diagnosis not present

## 2016-12-14 DIAGNOSIS — E1129 Type 2 diabetes mellitus with other diabetic kidney complication: Secondary | ICD-10-CM | POA: Diagnosis not present

## 2016-12-14 DIAGNOSIS — I509 Heart failure, unspecified: Secondary | ICD-10-CM | POA: Diagnosis not present

## 2016-12-14 DIAGNOSIS — D649 Anemia, unspecified: Secondary | ICD-10-CM | POA: Diagnosis not present

## 2016-12-14 DIAGNOSIS — N184 Chronic kidney disease, stage 4 (severe): Secondary | ICD-10-CM | POA: Diagnosis not present

## 2016-12-17 NOTE — Addendum Note (Signed)
Addendum  created 12/17/16 1103 by Lyn Hollingshead, MD   Sign clinical note

## 2016-12-23 DIAGNOSIS — N184 Chronic kidney disease, stage 4 (severe): Secondary | ICD-10-CM | POA: Diagnosis not present

## 2016-12-23 DIAGNOSIS — D631 Anemia in chronic kidney disease: Secondary | ICD-10-CM | POA: Diagnosis not present

## 2016-12-27 DIAGNOSIS — I639 Cerebral infarction, unspecified: Secondary | ICD-10-CM | POA: Diagnosis not present

## 2016-12-27 DIAGNOSIS — M159 Polyosteoarthritis, unspecified: Secondary | ICD-10-CM | POA: Diagnosis not present

## 2016-12-27 DIAGNOSIS — E119 Type 2 diabetes mellitus without complications: Secondary | ICD-10-CM | POA: Diagnosis not present

## 2016-12-29 DIAGNOSIS — M79606 Pain in leg, unspecified: Secondary | ICD-10-CM | POA: Diagnosis not present

## 2016-12-29 DIAGNOSIS — I351 Nonrheumatic aortic (valve) insufficiency: Secondary | ICD-10-CM | POA: Diagnosis not present

## 2016-12-29 DIAGNOSIS — Z6830 Body mass index (BMI) 30.0-30.9, adult: Secondary | ICD-10-CM | POA: Diagnosis not present

## 2016-12-29 DIAGNOSIS — C61 Malignant neoplasm of prostate: Secondary | ICD-10-CM | POA: Diagnosis not present

## 2016-12-29 DIAGNOSIS — Z299 Encounter for prophylactic measures, unspecified: Secondary | ICD-10-CM | POA: Diagnosis not present

## 2016-12-29 DIAGNOSIS — N184 Chronic kidney disease, stage 4 (severe): Secondary | ICD-10-CM | POA: Diagnosis not present

## 2017-01-06 DIAGNOSIS — N184 Chronic kidney disease, stage 4 (severe): Secondary | ICD-10-CM | POA: Diagnosis not present

## 2017-01-06 DIAGNOSIS — D631 Anemia in chronic kidney disease: Secondary | ICD-10-CM | POA: Diagnosis not present

## 2017-01-08 DIAGNOSIS — M25552 Pain in left hip: Secondary | ICD-10-CM | POA: Diagnosis not present

## 2017-01-08 DIAGNOSIS — Z794 Long term (current) use of insulin: Secondary | ICD-10-CM | POA: Diagnosis not present

## 2017-01-08 DIAGNOSIS — N189 Chronic kidney disease, unspecified: Secondary | ICD-10-CM | POA: Diagnosis not present

## 2017-01-08 DIAGNOSIS — M47817 Spondylosis without myelopathy or radiculopathy, lumbosacral region: Secondary | ICD-10-CM | POA: Diagnosis not present

## 2017-01-08 DIAGNOSIS — M6281 Muscle weakness (generalized): Secondary | ICD-10-CM | POA: Diagnosis not present

## 2017-01-08 DIAGNOSIS — I69354 Hemiplegia and hemiparesis following cerebral infarction affecting left non-dominant side: Secondary | ICD-10-CM | POA: Diagnosis not present

## 2017-01-08 DIAGNOSIS — Z8546 Personal history of malignant neoplasm of prostate: Secondary | ICD-10-CM | POA: Diagnosis not present

## 2017-01-08 DIAGNOSIS — T50905A Adverse effect of unspecified drugs, medicaments and biological substances, initial encounter: Secondary | ICD-10-CM | POA: Diagnosis not present

## 2017-01-08 DIAGNOSIS — G3189 Other specified degenerative diseases of nervous system: Secondary | ICD-10-CM | POA: Diagnosis not present

## 2017-01-08 DIAGNOSIS — E1165 Type 2 diabetes mellitus with hyperglycemia: Secondary | ICD-10-CM | POA: Diagnosis not present

## 2017-01-08 DIAGNOSIS — M79605 Pain in left leg: Secondary | ICD-10-CM | POA: Diagnosis not present

## 2017-01-08 DIAGNOSIS — E039 Hypothyroidism, unspecified: Secondary | ICD-10-CM | POA: Diagnosis not present

## 2017-01-08 DIAGNOSIS — R262 Difficulty in walking, not elsewhere classified: Secondary | ICD-10-CM | POA: Diagnosis not present

## 2017-01-08 DIAGNOSIS — S76812A Strain of other specified muscles, fascia and tendons at thigh level, left thigh, initial encounter: Secondary | ICD-10-CM | POA: Diagnosis not present

## 2017-01-08 DIAGNOSIS — Z8711 Personal history of peptic ulcer disease: Secondary | ICD-10-CM | POA: Diagnosis not present

## 2017-01-08 DIAGNOSIS — K219 Gastro-esophageal reflux disease without esophagitis: Secondary | ICD-10-CM | POA: Diagnosis not present

## 2017-01-08 DIAGNOSIS — Z833 Family history of diabetes mellitus: Secondary | ICD-10-CM | POA: Diagnosis not present

## 2017-01-08 DIAGNOSIS — E1122 Type 2 diabetes mellitus with diabetic chronic kidney disease: Secondary | ICD-10-CM | POA: Diagnosis not present

## 2017-01-08 DIAGNOSIS — Z79899 Other long term (current) drug therapy: Secondary | ICD-10-CM | POA: Diagnosis not present

## 2017-01-09 DIAGNOSIS — Z79899 Other long term (current) drug therapy: Secondary | ICD-10-CM | POA: Diagnosis not present

## 2017-01-09 DIAGNOSIS — M79605 Pain in left leg: Secondary | ICD-10-CM | POA: Diagnosis not present

## 2017-01-09 DIAGNOSIS — N189 Chronic kidney disease, unspecified: Secondary | ICD-10-CM | POA: Diagnosis not present

## 2017-01-09 DIAGNOSIS — S76812A Strain of other specified muscles, fascia and tendons at thigh level, left thigh, initial encounter: Secondary | ICD-10-CM | POA: Diagnosis not present

## 2017-01-09 DIAGNOSIS — E039 Hypothyroidism, unspecified: Secondary | ICD-10-CM | POA: Diagnosis not present

## 2017-01-09 DIAGNOSIS — E1122 Type 2 diabetes mellitus with diabetic chronic kidney disease: Secondary | ICD-10-CM | POA: Diagnosis not present

## 2017-01-09 DIAGNOSIS — Z8546 Personal history of malignant neoplasm of prostate: Secondary | ICD-10-CM | POA: Diagnosis not present

## 2017-01-09 DIAGNOSIS — R262 Difficulty in walking, not elsewhere classified: Secondary | ICD-10-CM | POA: Diagnosis not present

## 2017-01-09 DIAGNOSIS — Z8711 Personal history of peptic ulcer disease: Secondary | ICD-10-CM | POA: Diagnosis not present

## 2017-01-09 DIAGNOSIS — M25552 Pain in left hip: Secondary | ICD-10-CM | POA: Diagnosis not present

## 2017-01-09 DIAGNOSIS — K219 Gastro-esophageal reflux disease without esophagitis: Secondary | ICD-10-CM | POA: Diagnosis not present

## 2017-01-09 DIAGNOSIS — Z794 Long term (current) use of insulin: Secondary | ICD-10-CM | POA: Diagnosis not present

## 2017-01-09 DIAGNOSIS — M6281 Muscle weakness (generalized): Secondary | ICD-10-CM | POA: Diagnosis not present

## 2017-01-09 DIAGNOSIS — I69354 Hemiplegia and hemiparesis following cerebral infarction affecting left non-dominant side: Secondary | ICD-10-CM | POA: Diagnosis not present

## 2017-01-09 DIAGNOSIS — M47817 Spondylosis without myelopathy or radiculopathy, lumbosacral region: Secondary | ICD-10-CM | POA: Diagnosis not present

## 2017-01-09 DIAGNOSIS — Z833 Family history of diabetes mellitus: Secondary | ICD-10-CM | POA: Diagnosis not present

## 2017-01-10 DIAGNOSIS — M25552 Pain in left hip: Secondary | ICD-10-CM | POA: Diagnosis not present

## 2017-01-10 DIAGNOSIS — R531 Weakness: Secondary | ICD-10-CM | POA: Diagnosis not present

## 2017-01-10 DIAGNOSIS — M6281 Muscle weakness (generalized): Secondary | ICD-10-CM | POA: Diagnosis not present

## 2017-01-10 DIAGNOSIS — S76812A Strain of other specified muscles, fascia and tendons at thigh level, left thigh, initial encounter: Secondary | ICD-10-CM | POA: Diagnosis not present

## 2017-01-10 DIAGNOSIS — E1122 Type 2 diabetes mellitus with diabetic chronic kidney disease: Secondary | ICD-10-CM | POA: Diagnosis not present

## 2017-01-10 DIAGNOSIS — M47817 Spondylosis without myelopathy or radiculopathy, lumbosacral region: Secondary | ICD-10-CM | POA: Diagnosis not present

## 2017-01-10 DIAGNOSIS — X58XXXA Exposure to other specified factors, initial encounter: Secondary | ICD-10-CM | POA: Diagnosis not present

## 2017-01-10 DIAGNOSIS — N189 Chronic kidney disease, unspecified: Secondary | ICD-10-CM | POA: Diagnosis not present

## 2017-01-10 DIAGNOSIS — I639 Cerebral infarction, unspecified: Secondary | ICD-10-CM | POA: Diagnosis not present

## 2017-01-10 DIAGNOSIS — M79605 Pain in left leg: Secondary | ICD-10-CM | POA: Diagnosis not present

## 2017-01-11 DIAGNOSIS — M79605 Pain in left leg: Secondary | ICD-10-CM | POA: Diagnosis not present

## 2017-01-11 DIAGNOSIS — S76812A Strain of other specified muscles, fascia and tendons at thigh level, left thigh, initial encounter: Secondary | ICD-10-CM | POA: Diagnosis not present

## 2017-01-11 DIAGNOSIS — E1122 Type 2 diabetes mellitus with diabetic chronic kidney disease: Secondary | ICD-10-CM | POA: Diagnosis not present

## 2017-01-11 DIAGNOSIS — M25552 Pain in left hip: Secondary | ICD-10-CM | POA: Diagnosis not present

## 2017-01-11 DIAGNOSIS — M47817 Spondylosis without myelopathy or radiculopathy, lumbosacral region: Secondary | ICD-10-CM | POA: Diagnosis not present

## 2017-01-11 DIAGNOSIS — M6281 Muscle weakness (generalized): Secondary | ICD-10-CM | POA: Diagnosis not present

## 2017-01-11 DIAGNOSIS — X58XXXA Exposure to other specified factors, initial encounter: Secondary | ICD-10-CM | POA: Diagnosis not present

## 2017-01-14 DIAGNOSIS — N189 Chronic kidney disease, unspecified: Secondary | ICD-10-CM | POA: Diagnosis not present

## 2017-01-14 DIAGNOSIS — M5136 Other intervertebral disc degeneration, lumbar region: Secondary | ICD-10-CM | POA: Diagnosis not present

## 2017-01-14 DIAGNOSIS — S76012D Strain of muscle, fascia and tendon of left hip, subsequent encounter: Secondary | ICD-10-CM | POA: Diagnosis not present

## 2017-01-14 DIAGNOSIS — I69354 Hemiplegia and hemiparesis following cerebral infarction affecting left non-dominant side: Secondary | ICD-10-CM | POA: Diagnosis not present

## 2017-01-14 DIAGNOSIS — Z794 Long term (current) use of insulin: Secondary | ICD-10-CM | POA: Diagnosis not present

## 2017-01-14 DIAGNOSIS — E1122 Type 2 diabetes mellitus with diabetic chronic kidney disease: Secondary | ICD-10-CM | POA: Diagnosis not present

## 2017-01-14 DIAGNOSIS — I129 Hypertensive chronic kidney disease with stage 1 through stage 4 chronic kidney disease, or unspecified chronic kidney disease: Secondary | ICD-10-CM | POA: Diagnosis not present

## 2017-01-17 DIAGNOSIS — I69354 Hemiplegia and hemiparesis following cerebral infarction affecting left non-dominant side: Secondary | ICD-10-CM | POA: Diagnosis not present

## 2017-01-17 DIAGNOSIS — I129 Hypertensive chronic kidney disease with stage 1 through stage 4 chronic kidney disease, or unspecified chronic kidney disease: Secondary | ICD-10-CM | POA: Diagnosis not present

## 2017-01-17 DIAGNOSIS — S76012D Strain of muscle, fascia and tendon of left hip, subsequent encounter: Secondary | ICD-10-CM | POA: Diagnosis not present

## 2017-01-17 DIAGNOSIS — N189 Chronic kidney disease, unspecified: Secondary | ICD-10-CM | POA: Diagnosis not present

## 2017-01-17 DIAGNOSIS — M5136 Other intervertebral disc degeneration, lumbar region: Secondary | ICD-10-CM | POA: Diagnosis not present

## 2017-01-17 DIAGNOSIS — E1122 Type 2 diabetes mellitus with diabetic chronic kidney disease: Secondary | ICD-10-CM | POA: Diagnosis not present

## 2017-01-19 DIAGNOSIS — E1122 Type 2 diabetes mellitus with diabetic chronic kidney disease: Secondary | ICD-10-CM | POA: Diagnosis not present

## 2017-01-19 DIAGNOSIS — N189 Chronic kidney disease, unspecified: Secondary | ICD-10-CM | POA: Diagnosis not present

## 2017-01-19 DIAGNOSIS — I69354 Hemiplegia and hemiparesis following cerebral infarction affecting left non-dominant side: Secondary | ICD-10-CM | POA: Diagnosis not present

## 2017-01-19 DIAGNOSIS — S76012D Strain of muscle, fascia and tendon of left hip, subsequent encounter: Secondary | ICD-10-CM | POA: Diagnosis not present

## 2017-01-19 DIAGNOSIS — I129 Hypertensive chronic kidney disease with stage 1 through stage 4 chronic kidney disease, or unspecified chronic kidney disease: Secondary | ICD-10-CM | POA: Diagnosis not present

## 2017-01-19 DIAGNOSIS — M5136 Other intervertebral disc degeneration, lumbar region: Secondary | ICD-10-CM | POA: Diagnosis not present

## 2017-01-20 DIAGNOSIS — E1122 Type 2 diabetes mellitus with diabetic chronic kidney disease: Secondary | ICD-10-CM | POA: Diagnosis not present

## 2017-01-20 DIAGNOSIS — N184 Chronic kidney disease, stage 4 (severe): Secondary | ICD-10-CM | POA: Diagnosis not present

## 2017-01-20 DIAGNOSIS — I129 Hypertensive chronic kidney disease with stage 1 through stage 4 chronic kidney disease, or unspecified chronic kidney disease: Secondary | ICD-10-CM | POA: Diagnosis not present

## 2017-01-20 DIAGNOSIS — S76012D Strain of muscle, fascia and tendon of left hip, subsequent encounter: Secondary | ICD-10-CM | POA: Diagnosis not present

## 2017-01-20 DIAGNOSIS — D631 Anemia in chronic kidney disease: Secondary | ICD-10-CM | POA: Diagnosis not present

## 2017-01-20 DIAGNOSIS — N189 Chronic kidney disease, unspecified: Secondary | ICD-10-CM | POA: Diagnosis not present

## 2017-01-20 DIAGNOSIS — M5136 Other intervertebral disc degeneration, lumbar region: Secondary | ICD-10-CM | POA: Diagnosis not present

## 2017-01-20 DIAGNOSIS — I69354 Hemiplegia and hemiparesis following cerebral infarction affecting left non-dominant side: Secondary | ICD-10-CM | POA: Diagnosis not present

## 2017-01-21 DIAGNOSIS — M47816 Spondylosis without myelopathy or radiculopathy, lumbar region: Secondary | ICD-10-CM | POA: Diagnosis not present

## 2017-01-21 DIAGNOSIS — N189 Chronic kidney disease, unspecified: Secondary | ICD-10-CM | POA: Diagnosis not present

## 2017-01-21 DIAGNOSIS — I129 Hypertensive chronic kidney disease with stage 1 through stage 4 chronic kidney disease, or unspecified chronic kidney disease: Secondary | ICD-10-CM | POA: Diagnosis not present

## 2017-01-21 DIAGNOSIS — S76012D Strain of muscle, fascia and tendon of left hip, subsequent encounter: Secondary | ICD-10-CM | POA: Diagnosis not present

## 2017-01-21 DIAGNOSIS — I351 Nonrheumatic aortic (valve) insufficiency: Secondary | ICD-10-CM | POA: Diagnosis not present

## 2017-01-21 DIAGNOSIS — M5136 Other intervertebral disc degeneration, lumbar region: Secondary | ICD-10-CM | POA: Diagnosis not present

## 2017-01-21 DIAGNOSIS — D638 Anemia in other chronic diseases classified elsewhere: Secondary | ICD-10-CM | POA: Diagnosis not present

## 2017-01-21 DIAGNOSIS — Z299 Encounter for prophylactic measures, unspecified: Secondary | ICD-10-CM | POA: Diagnosis not present

## 2017-01-21 DIAGNOSIS — C61 Malignant neoplasm of prostate: Secondary | ICD-10-CM | POA: Diagnosis not present

## 2017-01-21 DIAGNOSIS — N184 Chronic kidney disease, stage 4 (severe): Secondary | ICD-10-CM | POA: Diagnosis not present

## 2017-01-21 DIAGNOSIS — I69354 Hemiplegia and hemiparesis following cerebral infarction affecting left non-dominant side: Secondary | ICD-10-CM | POA: Diagnosis not present

## 2017-01-21 DIAGNOSIS — E1122 Type 2 diabetes mellitus with diabetic chronic kidney disease: Secondary | ICD-10-CM | POA: Diagnosis not present

## 2017-01-21 DIAGNOSIS — M79606 Pain in leg, unspecified: Secondary | ICD-10-CM | POA: Diagnosis not present

## 2017-01-21 DIAGNOSIS — Z09 Encounter for follow-up examination after completed treatment for conditions other than malignant neoplasm: Secondary | ICD-10-CM | POA: Diagnosis not present

## 2017-01-25 DIAGNOSIS — I129 Hypertensive chronic kidney disease with stage 1 through stage 4 chronic kidney disease, or unspecified chronic kidney disease: Secondary | ICD-10-CM | POA: Diagnosis not present

## 2017-01-25 DIAGNOSIS — I69354 Hemiplegia and hemiparesis following cerebral infarction affecting left non-dominant side: Secondary | ICD-10-CM | POA: Diagnosis not present

## 2017-01-25 DIAGNOSIS — N189 Chronic kidney disease, unspecified: Secondary | ICD-10-CM | POA: Diagnosis not present

## 2017-01-25 DIAGNOSIS — M5136 Other intervertebral disc degeneration, lumbar region: Secondary | ICD-10-CM | POA: Diagnosis not present

## 2017-01-25 DIAGNOSIS — S76012D Strain of muscle, fascia and tendon of left hip, subsequent encounter: Secondary | ICD-10-CM | POA: Diagnosis not present

## 2017-01-25 DIAGNOSIS — E1122 Type 2 diabetes mellitus with diabetic chronic kidney disease: Secondary | ICD-10-CM | POA: Diagnosis not present

## 2017-01-26 DIAGNOSIS — I129 Hypertensive chronic kidney disease with stage 1 through stage 4 chronic kidney disease, or unspecified chronic kidney disease: Secondary | ICD-10-CM | POA: Diagnosis not present

## 2017-01-26 DIAGNOSIS — S76012D Strain of muscle, fascia and tendon of left hip, subsequent encounter: Secondary | ICD-10-CM | POA: Diagnosis not present

## 2017-01-26 DIAGNOSIS — E1122 Type 2 diabetes mellitus with diabetic chronic kidney disease: Secondary | ICD-10-CM | POA: Diagnosis not present

## 2017-01-26 DIAGNOSIS — I69354 Hemiplegia and hemiparesis following cerebral infarction affecting left non-dominant side: Secondary | ICD-10-CM | POA: Diagnosis not present

## 2017-01-26 DIAGNOSIS — N189 Chronic kidney disease, unspecified: Secondary | ICD-10-CM | POA: Diagnosis not present

## 2017-01-26 DIAGNOSIS — M5136 Other intervertebral disc degeneration, lumbar region: Secondary | ICD-10-CM | POA: Diagnosis not present

## 2017-01-27 DIAGNOSIS — E1122 Type 2 diabetes mellitus with diabetic chronic kidney disease: Secondary | ICD-10-CM | POA: Diagnosis not present

## 2017-01-27 DIAGNOSIS — N189 Chronic kidney disease, unspecified: Secondary | ICD-10-CM | POA: Diagnosis not present

## 2017-01-27 DIAGNOSIS — I129 Hypertensive chronic kidney disease with stage 1 through stage 4 chronic kidney disease, or unspecified chronic kidney disease: Secondary | ICD-10-CM | POA: Diagnosis not present

## 2017-01-27 DIAGNOSIS — S76012D Strain of muscle, fascia and tendon of left hip, subsequent encounter: Secondary | ICD-10-CM | POA: Diagnosis not present

## 2017-01-27 DIAGNOSIS — I69354 Hemiplegia and hemiparesis following cerebral infarction affecting left non-dominant side: Secondary | ICD-10-CM | POA: Diagnosis not present

## 2017-01-27 DIAGNOSIS — M5136 Other intervertebral disc degeneration, lumbar region: Secondary | ICD-10-CM | POA: Diagnosis not present

## 2017-01-28 DIAGNOSIS — I69354 Hemiplegia and hemiparesis following cerebral infarction affecting left non-dominant side: Secondary | ICD-10-CM | POA: Diagnosis not present

## 2017-01-28 DIAGNOSIS — S76012D Strain of muscle, fascia and tendon of left hip, subsequent encounter: Secondary | ICD-10-CM | POA: Diagnosis not present

## 2017-01-28 DIAGNOSIS — N189 Chronic kidney disease, unspecified: Secondary | ICD-10-CM | POA: Diagnosis not present

## 2017-01-28 DIAGNOSIS — I129 Hypertensive chronic kidney disease with stage 1 through stage 4 chronic kidney disease, or unspecified chronic kidney disease: Secondary | ICD-10-CM | POA: Diagnosis not present

## 2017-01-28 DIAGNOSIS — M5136 Other intervertebral disc degeneration, lumbar region: Secondary | ICD-10-CM | POA: Diagnosis not present

## 2017-01-28 DIAGNOSIS — E1122 Type 2 diabetes mellitus with diabetic chronic kidney disease: Secondary | ICD-10-CM | POA: Diagnosis not present

## 2017-01-31 DIAGNOSIS — Z713 Dietary counseling and surveillance: Secondary | ICD-10-CM | POA: Diagnosis not present

## 2017-01-31 DIAGNOSIS — E1122 Type 2 diabetes mellitus with diabetic chronic kidney disease: Secondary | ICD-10-CM | POA: Diagnosis not present

## 2017-01-31 DIAGNOSIS — Z299 Encounter for prophylactic measures, unspecified: Secondary | ICD-10-CM | POA: Diagnosis not present

## 2017-01-31 DIAGNOSIS — I639 Cerebral infarction, unspecified: Secondary | ICD-10-CM | POA: Diagnosis not present

## 2017-01-31 DIAGNOSIS — M109 Gout, unspecified: Secondary | ICD-10-CM | POA: Diagnosis not present

## 2017-01-31 DIAGNOSIS — C61 Malignant neoplasm of prostate: Secondary | ICD-10-CM | POA: Diagnosis not present

## 2017-01-31 DIAGNOSIS — I351 Nonrheumatic aortic (valve) insufficiency: Secondary | ICD-10-CM | POA: Diagnosis not present

## 2017-01-31 DIAGNOSIS — N184 Chronic kidney disease, stage 4 (severe): Secondary | ICD-10-CM | POA: Diagnosis not present

## 2017-02-01 DIAGNOSIS — I129 Hypertensive chronic kidney disease with stage 1 through stage 4 chronic kidney disease, or unspecified chronic kidney disease: Secondary | ICD-10-CM | POA: Diagnosis not present

## 2017-02-01 DIAGNOSIS — I69354 Hemiplegia and hemiparesis following cerebral infarction affecting left non-dominant side: Secondary | ICD-10-CM | POA: Diagnosis not present

## 2017-02-01 DIAGNOSIS — M5136 Other intervertebral disc degeneration, lumbar region: Secondary | ICD-10-CM | POA: Diagnosis not present

## 2017-02-01 DIAGNOSIS — E1122 Type 2 diabetes mellitus with diabetic chronic kidney disease: Secondary | ICD-10-CM | POA: Diagnosis not present

## 2017-02-01 DIAGNOSIS — N189 Chronic kidney disease, unspecified: Secondary | ICD-10-CM | POA: Diagnosis not present

## 2017-02-01 DIAGNOSIS — S76012D Strain of muscle, fascia and tendon of left hip, subsequent encounter: Secondary | ICD-10-CM | POA: Diagnosis not present

## 2017-02-02 DIAGNOSIS — E1151 Type 2 diabetes mellitus with diabetic peripheral angiopathy without gangrene: Secondary | ICD-10-CM | POA: Diagnosis not present

## 2017-02-02 DIAGNOSIS — E1140 Type 2 diabetes mellitus with diabetic neuropathy, unspecified: Secondary | ICD-10-CM | POA: Diagnosis not present

## 2017-02-03 DIAGNOSIS — I69354 Hemiplegia and hemiparesis following cerebral infarction affecting left non-dominant side: Secondary | ICD-10-CM | POA: Diagnosis not present

## 2017-02-03 DIAGNOSIS — D631 Anemia in chronic kidney disease: Secondary | ICD-10-CM | POA: Diagnosis not present

## 2017-02-03 DIAGNOSIS — E1122 Type 2 diabetes mellitus with diabetic chronic kidney disease: Secondary | ICD-10-CM | POA: Diagnosis not present

## 2017-02-03 DIAGNOSIS — N184 Chronic kidney disease, stage 4 (severe): Secondary | ICD-10-CM | POA: Diagnosis not present

## 2017-02-03 DIAGNOSIS — N189 Chronic kidney disease, unspecified: Secondary | ICD-10-CM | POA: Diagnosis not present

## 2017-02-03 DIAGNOSIS — M5136 Other intervertebral disc degeneration, lumbar region: Secondary | ICD-10-CM | POA: Diagnosis not present

## 2017-02-03 DIAGNOSIS — I129 Hypertensive chronic kidney disease with stage 1 through stage 4 chronic kidney disease, or unspecified chronic kidney disease: Secondary | ICD-10-CM | POA: Diagnosis not present

## 2017-02-03 DIAGNOSIS — S76012D Strain of muscle, fascia and tendon of left hip, subsequent encounter: Secondary | ICD-10-CM | POA: Diagnosis not present

## 2017-02-04 DIAGNOSIS — I129 Hypertensive chronic kidney disease with stage 1 through stage 4 chronic kidney disease, or unspecified chronic kidney disease: Secondary | ICD-10-CM | POA: Diagnosis not present

## 2017-02-04 DIAGNOSIS — E1122 Type 2 diabetes mellitus with diabetic chronic kidney disease: Secondary | ICD-10-CM | POA: Diagnosis not present

## 2017-02-04 DIAGNOSIS — I69354 Hemiplegia and hemiparesis following cerebral infarction affecting left non-dominant side: Secondary | ICD-10-CM | POA: Diagnosis not present

## 2017-02-04 DIAGNOSIS — S76012D Strain of muscle, fascia and tendon of left hip, subsequent encounter: Secondary | ICD-10-CM | POA: Diagnosis not present

## 2017-02-04 DIAGNOSIS — N189 Chronic kidney disease, unspecified: Secondary | ICD-10-CM | POA: Diagnosis not present

## 2017-02-04 DIAGNOSIS — M5136 Other intervertebral disc degeneration, lumbar region: Secondary | ICD-10-CM | POA: Diagnosis not present

## 2017-02-07 DIAGNOSIS — N189 Chronic kidney disease, unspecified: Secondary | ICD-10-CM | POA: Diagnosis not present

## 2017-02-07 DIAGNOSIS — I700 Atherosclerosis of aorta: Secondary | ICD-10-CM | POA: Diagnosis not present

## 2017-02-07 DIAGNOSIS — I129 Hypertensive chronic kidney disease with stage 1 through stage 4 chronic kidney disease, or unspecified chronic kidney disease: Secondary | ICD-10-CM | POA: Diagnosis not present

## 2017-02-07 DIAGNOSIS — R0602 Shortness of breath: Secondary | ICD-10-CM | POA: Diagnosis not present

## 2017-02-07 DIAGNOSIS — I69354 Hemiplegia and hemiparesis following cerebral infarction affecting left non-dominant side: Secondary | ICD-10-CM | POA: Diagnosis not present

## 2017-02-07 DIAGNOSIS — E1122 Type 2 diabetes mellitus with diabetic chronic kidney disease: Secondary | ICD-10-CM | POA: Diagnosis not present

## 2017-02-07 DIAGNOSIS — M5136 Other intervertebral disc degeneration, lumbar region: Secondary | ICD-10-CM | POA: Diagnosis not present

## 2017-02-07 DIAGNOSIS — S76012D Strain of muscle, fascia and tendon of left hip, subsequent encounter: Secondary | ICD-10-CM | POA: Diagnosis not present

## 2017-02-08 DIAGNOSIS — S76012D Strain of muscle, fascia and tendon of left hip, subsequent encounter: Secondary | ICD-10-CM | POA: Diagnosis not present

## 2017-02-08 DIAGNOSIS — M5136 Other intervertebral disc degeneration, lumbar region: Secondary | ICD-10-CM | POA: Diagnosis not present

## 2017-02-08 DIAGNOSIS — I129 Hypertensive chronic kidney disease with stage 1 through stage 4 chronic kidney disease, or unspecified chronic kidney disease: Secondary | ICD-10-CM | POA: Diagnosis not present

## 2017-02-08 DIAGNOSIS — E1122 Type 2 diabetes mellitus with diabetic chronic kidney disease: Secondary | ICD-10-CM | POA: Diagnosis not present

## 2017-02-08 DIAGNOSIS — I69354 Hemiplegia and hemiparesis following cerebral infarction affecting left non-dominant side: Secondary | ICD-10-CM | POA: Diagnosis not present

## 2017-02-08 DIAGNOSIS — N189 Chronic kidney disease, unspecified: Secondary | ICD-10-CM | POA: Diagnosis not present

## 2017-02-09 DIAGNOSIS — S76012D Strain of muscle, fascia and tendon of left hip, subsequent encounter: Secondary | ICD-10-CM | POA: Diagnosis not present

## 2017-02-09 DIAGNOSIS — E1122 Type 2 diabetes mellitus with diabetic chronic kidney disease: Secondary | ICD-10-CM | POA: Diagnosis not present

## 2017-02-09 DIAGNOSIS — I69354 Hemiplegia and hemiparesis following cerebral infarction affecting left non-dominant side: Secondary | ICD-10-CM | POA: Diagnosis not present

## 2017-02-09 DIAGNOSIS — M5136 Other intervertebral disc degeneration, lumbar region: Secondary | ICD-10-CM | POA: Diagnosis not present

## 2017-02-09 DIAGNOSIS — I129 Hypertensive chronic kidney disease with stage 1 through stage 4 chronic kidney disease, or unspecified chronic kidney disease: Secondary | ICD-10-CM | POA: Diagnosis not present

## 2017-02-09 DIAGNOSIS — N189 Chronic kidney disease, unspecified: Secondary | ICD-10-CM | POA: Diagnosis not present

## 2017-02-14 DIAGNOSIS — E1122 Type 2 diabetes mellitus with diabetic chronic kidney disease: Secondary | ICD-10-CM | POA: Diagnosis not present

## 2017-02-14 DIAGNOSIS — S76012D Strain of muscle, fascia and tendon of left hip, subsequent encounter: Secondary | ICD-10-CM | POA: Diagnosis not present

## 2017-02-14 DIAGNOSIS — M5136 Other intervertebral disc degeneration, lumbar region: Secondary | ICD-10-CM | POA: Diagnosis not present

## 2017-02-14 DIAGNOSIS — I69354 Hemiplegia and hemiparesis following cerebral infarction affecting left non-dominant side: Secondary | ICD-10-CM | POA: Diagnosis not present

## 2017-02-14 DIAGNOSIS — I129 Hypertensive chronic kidney disease with stage 1 through stage 4 chronic kidney disease, or unspecified chronic kidney disease: Secondary | ICD-10-CM | POA: Diagnosis not present

## 2017-02-14 DIAGNOSIS — N189 Chronic kidney disease, unspecified: Secondary | ICD-10-CM | POA: Diagnosis not present

## 2017-02-15 DIAGNOSIS — M5136 Other intervertebral disc degeneration, lumbar region: Secondary | ICD-10-CM | POA: Diagnosis not present

## 2017-02-15 DIAGNOSIS — I129 Hypertensive chronic kidney disease with stage 1 through stage 4 chronic kidney disease, or unspecified chronic kidney disease: Secondary | ICD-10-CM | POA: Diagnosis not present

## 2017-02-15 DIAGNOSIS — M159 Polyosteoarthritis, unspecified: Secondary | ICD-10-CM | POA: Diagnosis not present

## 2017-02-15 DIAGNOSIS — N189 Chronic kidney disease, unspecified: Secondary | ICD-10-CM | POA: Diagnosis not present

## 2017-02-15 DIAGNOSIS — E119 Type 2 diabetes mellitus without complications: Secondary | ICD-10-CM | POA: Diagnosis not present

## 2017-02-15 DIAGNOSIS — E1122 Type 2 diabetes mellitus with diabetic chronic kidney disease: Secondary | ICD-10-CM | POA: Diagnosis not present

## 2017-02-15 DIAGNOSIS — S76012D Strain of muscle, fascia and tendon of left hip, subsequent encounter: Secondary | ICD-10-CM | POA: Diagnosis not present

## 2017-02-15 DIAGNOSIS — I69354 Hemiplegia and hemiparesis following cerebral infarction affecting left non-dominant side: Secondary | ICD-10-CM | POA: Diagnosis not present

## 2017-02-15 DIAGNOSIS — I639 Cerebral infarction, unspecified: Secondary | ICD-10-CM | POA: Diagnosis not present

## 2017-02-16 DIAGNOSIS — S76012D Strain of muscle, fascia and tendon of left hip, subsequent encounter: Secondary | ICD-10-CM | POA: Diagnosis not present

## 2017-02-16 DIAGNOSIS — E1122 Type 2 diabetes mellitus with diabetic chronic kidney disease: Secondary | ICD-10-CM | POA: Diagnosis not present

## 2017-02-16 DIAGNOSIS — N189 Chronic kidney disease, unspecified: Secondary | ICD-10-CM | POA: Diagnosis not present

## 2017-02-16 DIAGNOSIS — M5136 Other intervertebral disc degeneration, lumbar region: Secondary | ICD-10-CM | POA: Diagnosis not present

## 2017-02-16 DIAGNOSIS — I129 Hypertensive chronic kidney disease with stage 1 through stage 4 chronic kidney disease, or unspecified chronic kidney disease: Secondary | ICD-10-CM | POA: Diagnosis not present

## 2017-02-16 DIAGNOSIS — I69354 Hemiplegia and hemiparesis following cerebral infarction affecting left non-dominant side: Secondary | ICD-10-CM | POA: Diagnosis not present

## 2017-02-17 DIAGNOSIS — N184 Chronic kidney disease, stage 4 (severe): Secondary | ICD-10-CM | POA: Diagnosis not present

## 2017-02-17 DIAGNOSIS — M5136 Other intervertebral disc degeneration, lumbar region: Secondary | ICD-10-CM | POA: Diagnosis not present

## 2017-02-17 DIAGNOSIS — D631 Anemia in chronic kidney disease: Secondary | ICD-10-CM | POA: Diagnosis not present

## 2017-02-17 DIAGNOSIS — I129 Hypertensive chronic kidney disease with stage 1 through stage 4 chronic kidney disease, or unspecified chronic kidney disease: Secondary | ICD-10-CM | POA: Diagnosis not present

## 2017-02-17 DIAGNOSIS — N189 Chronic kidney disease, unspecified: Secondary | ICD-10-CM | POA: Diagnosis not present

## 2017-02-17 DIAGNOSIS — S76012D Strain of muscle, fascia and tendon of left hip, subsequent encounter: Secondary | ICD-10-CM | POA: Diagnosis not present

## 2017-02-17 DIAGNOSIS — E1122 Type 2 diabetes mellitus with diabetic chronic kidney disease: Secondary | ICD-10-CM | POA: Diagnosis not present

## 2017-02-17 DIAGNOSIS — I69354 Hemiplegia and hemiparesis following cerebral infarction affecting left non-dominant side: Secondary | ICD-10-CM | POA: Diagnosis not present

## 2017-02-22 DIAGNOSIS — S76012D Strain of muscle, fascia and tendon of left hip, subsequent encounter: Secondary | ICD-10-CM | POA: Diagnosis not present

## 2017-02-22 DIAGNOSIS — I69354 Hemiplegia and hemiparesis following cerebral infarction affecting left non-dominant side: Secondary | ICD-10-CM | POA: Diagnosis not present

## 2017-02-22 DIAGNOSIS — I129 Hypertensive chronic kidney disease with stage 1 through stage 4 chronic kidney disease, or unspecified chronic kidney disease: Secondary | ICD-10-CM | POA: Diagnosis not present

## 2017-02-22 DIAGNOSIS — E1122 Type 2 diabetes mellitus with diabetic chronic kidney disease: Secondary | ICD-10-CM | POA: Diagnosis not present

## 2017-02-22 DIAGNOSIS — M5136 Other intervertebral disc degeneration, lumbar region: Secondary | ICD-10-CM | POA: Diagnosis not present

## 2017-02-22 DIAGNOSIS — N189 Chronic kidney disease, unspecified: Secondary | ICD-10-CM | POA: Diagnosis not present

## 2017-02-23 DIAGNOSIS — I69354 Hemiplegia and hemiparesis following cerebral infarction affecting left non-dominant side: Secondary | ICD-10-CM | POA: Diagnosis not present

## 2017-02-23 DIAGNOSIS — S76012D Strain of muscle, fascia and tendon of left hip, subsequent encounter: Secondary | ICD-10-CM | POA: Diagnosis not present

## 2017-02-23 DIAGNOSIS — N189 Chronic kidney disease, unspecified: Secondary | ICD-10-CM | POA: Diagnosis not present

## 2017-02-23 DIAGNOSIS — I129 Hypertensive chronic kidney disease with stage 1 through stage 4 chronic kidney disease, or unspecified chronic kidney disease: Secondary | ICD-10-CM | POA: Diagnosis not present

## 2017-02-23 DIAGNOSIS — E1122 Type 2 diabetes mellitus with diabetic chronic kidney disease: Secondary | ICD-10-CM | POA: Diagnosis not present

## 2017-02-23 DIAGNOSIS — M5136 Other intervertebral disc degeneration, lumbar region: Secondary | ICD-10-CM | POA: Diagnosis not present

## 2017-02-24 DIAGNOSIS — I69354 Hemiplegia and hemiparesis following cerebral infarction affecting left non-dominant side: Secondary | ICD-10-CM | POA: Diagnosis not present

## 2017-02-24 DIAGNOSIS — S76012D Strain of muscle, fascia and tendon of left hip, subsequent encounter: Secondary | ICD-10-CM | POA: Diagnosis not present

## 2017-02-24 DIAGNOSIS — N189 Chronic kidney disease, unspecified: Secondary | ICD-10-CM | POA: Diagnosis not present

## 2017-02-24 DIAGNOSIS — I129 Hypertensive chronic kidney disease with stage 1 through stage 4 chronic kidney disease, or unspecified chronic kidney disease: Secondary | ICD-10-CM | POA: Diagnosis not present

## 2017-02-24 DIAGNOSIS — E1122 Type 2 diabetes mellitus with diabetic chronic kidney disease: Secondary | ICD-10-CM | POA: Diagnosis not present

## 2017-02-24 DIAGNOSIS — M5136 Other intervertebral disc degeneration, lumbar region: Secondary | ICD-10-CM | POA: Diagnosis not present

## 2017-02-25 DIAGNOSIS — M5136 Other intervertebral disc degeneration, lumbar region: Secondary | ICD-10-CM | POA: Diagnosis not present

## 2017-02-25 DIAGNOSIS — I69354 Hemiplegia and hemiparesis following cerebral infarction affecting left non-dominant side: Secondary | ICD-10-CM | POA: Diagnosis not present

## 2017-02-25 DIAGNOSIS — E1122 Type 2 diabetes mellitus with diabetic chronic kidney disease: Secondary | ICD-10-CM | POA: Diagnosis not present

## 2017-02-25 DIAGNOSIS — S76012D Strain of muscle, fascia and tendon of left hip, subsequent encounter: Secondary | ICD-10-CM | POA: Diagnosis not present

## 2017-02-25 DIAGNOSIS — N189 Chronic kidney disease, unspecified: Secondary | ICD-10-CM | POA: Diagnosis not present

## 2017-02-25 DIAGNOSIS — I129 Hypertensive chronic kidney disease with stage 1 through stage 4 chronic kidney disease, or unspecified chronic kidney disease: Secondary | ICD-10-CM | POA: Diagnosis not present

## 2017-02-28 DIAGNOSIS — I129 Hypertensive chronic kidney disease with stage 1 through stage 4 chronic kidney disease, or unspecified chronic kidney disease: Secondary | ICD-10-CM | POA: Diagnosis not present

## 2017-02-28 DIAGNOSIS — E1122 Type 2 diabetes mellitus with diabetic chronic kidney disease: Secondary | ICD-10-CM | POA: Diagnosis not present

## 2017-02-28 DIAGNOSIS — I69354 Hemiplegia and hemiparesis following cerebral infarction affecting left non-dominant side: Secondary | ICD-10-CM | POA: Diagnosis not present

## 2017-02-28 DIAGNOSIS — M5136 Other intervertebral disc degeneration, lumbar region: Secondary | ICD-10-CM | POA: Diagnosis not present

## 2017-02-28 DIAGNOSIS — S76012D Strain of muscle, fascia and tendon of left hip, subsequent encounter: Secondary | ICD-10-CM | POA: Diagnosis not present

## 2017-02-28 DIAGNOSIS — N189 Chronic kidney disease, unspecified: Secondary | ICD-10-CM | POA: Diagnosis not present

## 2017-03-01 DIAGNOSIS — I69354 Hemiplegia and hemiparesis following cerebral infarction affecting left non-dominant side: Secondary | ICD-10-CM | POA: Diagnosis not present

## 2017-03-01 DIAGNOSIS — I129 Hypertensive chronic kidney disease with stage 1 through stage 4 chronic kidney disease, or unspecified chronic kidney disease: Secondary | ICD-10-CM | POA: Diagnosis not present

## 2017-03-01 DIAGNOSIS — N189 Chronic kidney disease, unspecified: Secondary | ICD-10-CM | POA: Diagnosis not present

## 2017-03-01 DIAGNOSIS — S76012D Strain of muscle, fascia and tendon of left hip, subsequent encounter: Secondary | ICD-10-CM | POA: Diagnosis not present

## 2017-03-01 DIAGNOSIS — E1122 Type 2 diabetes mellitus with diabetic chronic kidney disease: Secondary | ICD-10-CM | POA: Diagnosis not present

## 2017-03-01 DIAGNOSIS — M5136 Other intervertebral disc degeneration, lumbar region: Secondary | ICD-10-CM | POA: Diagnosis not present

## 2017-03-02 DIAGNOSIS — S76012D Strain of muscle, fascia and tendon of left hip, subsequent encounter: Secondary | ICD-10-CM | POA: Diagnosis not present

## 2017-03-02 DIAGNOSIS — N189 Chronic kidney disease, unspecified: Secondary | ICD-10-CM | POA: Diagnosis not present

## 2017-03-02 DIAGNOSIS — E1122 Type 2 diabetes mellitus with diabetic chronic kidney disease: Secondary | ICD-10-CM | POA: Diagnosis not present

## 2017-03-02 DIAGNOSIS — I129 Hypertensive chronic kidney disease with stage 1 through stage 4 chronic kidney disease, or unspecified chronic kidney disease: Secondary | ICD-10-CM | POA: Diagnosis not present

## 2017-03-02 DIAGNOSIS — M5136 Other intervertebral disc degeneration, lumbar region: Secondary | ICD-10-CM | POA: Diagnosis not present

## 2017-03-02 DIAGNOSIS — I69354 Hemiplegia and hemiparesis following cerebral infarction affecting left non-dominant side: Secondary | ICD-10-CM | POA: Diagnosis not present

## 2017-03-03 DIAGNOSIS — D631 Anemia in chronic kidney disease: Secondary | ICD-10-CM | POA: Diagnosis not present

## 2017-03-03 DIAGNOSIS — M5136 Other intervertebral disc degeneration, lumbar region: Secondary | ICD-10-CM | POA: Diagnosis not present

## 2017-03-03 DIAGNOSIS — N184 Chronic kidney disease, stage 4 (severe): Secondary | ICD-10-CM | POA: Diagnosis not present

## 2017-03-03 DIAGNOSIS — S76012D Strain of muscle, fascia and tendon of left hip, subsequent encounter: Secondary | ICD-10-CM | POA: Diagnosis not present

## 2017-03-03 DIAGNOSIS — I69354 Hemiplegia and hemiparesis following cerebral infarction affecting left non-dominant side: Secondary | ICD-10-CM | POA: Diagnosis not present

## 2017-03-03 DIAGNOSIS — N189 Chronic kidney disease, unspecified: Secondary | ICD-10-CM | POA: Diagnosis not present

## 2017-03-03 DIAGNOSIS — I129 Hypertensive chronic kidney disease with stage 1 through stage 4 chronic kidney disease, or unspecified chronic kidney disease: Secondary | ICD-10-CM | POA: Diagnosis not present

## 2017-03-03 DIAGNOSIS — E1122 Type 2 diabetes mellitus with diabetic chronic kidney disease: Secondary | ICD-10-CM | POA: Diagnosis not present

## 2017-03-07 DIAGNOSIS — N189 Chronic kidney disease, unspecified: Secondary | ICD-10-CM | POA: Diagnosis not present

## 2017-03-07 DIAGNOSIS — E1122 Type 2 diabetes mellitus with diabetic chronic kidney disease: Secondary | ICD-10-CM | POA: Diagnosis not present

## 2017-03-07 DIAGNOSIS — M5136 Other intervertebral disc degeneration, lumbar region: Secondary | ICD-10-CM | POA: Diagnosis not present

## 2017-03-07 DIAGNOSIS — S76012D Strain of muscle, fascia and tendon of left hip, subsequent encounter: Secondary | ICD-10-CM | POA: Diagnosis not present

## 2017-03-07 DIAGNOSIS — I129 Hypertensive chronic kidney disease with stage 1 through stage 4 chronic kidney disease, or unspecified chronic kidney disease: Secondary | ICD-10-CM | POA: Diagnosis not present

## 2017-03-07 DIAGNOSIS — I69354 Hemiplegia and hemiparesis following cerebral infarction affecting left non-dominant side: Secondary | ICD-10-CM | POA: Diagnosis not present

## 2017-03-08 DIAGNOSIS — I69354 Hemiplegia and hemiparesis following cerebral infarction affecting left non-dominant side: Secondary | ICD-10-CM | POA: Diagnosis not present

## 2017-03-08 DIAGNOSIS — I129 Hypertensive chronic kidney disease with stage 1 through stage 4 chronic kidney disease, or unspecified chronic kidney disease: Secondary | ICD-10-CM | POA: Diagnosis not present

## 2017-03-08 DIAGNOSIS — M5136 Other intervertebral disc degeneration, lumbar region: Secondary | ICD-10-CM | POA: Diagnosis not present

## 2017-03-08 DIAGNOSIS — E1122 Type 2 diabetes mellitus with diabetic chronic kidney disease: Secondary | ICD-10-CM | POA: Diagnosis not present

## 2017-03-08 DIAGNOSIS — S76012D Strain of muscle, fascia and tendon of left hip, subsequent encounter: Secondary | ICD-10-CM | POA: Diagnosis not present

## 2017-03-08 DIAGNOSIS — N189 Chronic kidney disease, unspecified: Secondary | ICD-10-CM | POA: Diagnosis not present

## 2017-03-10 DIAGNOSIS — I69354 Hemiplegia and hemiparesis following cerebral infarction affecting left non-dominant side: Secondary | ICD-10-CM | POA: Diagnosis not present

## 2017-03-10 DIAGNOSIS — I129 Hypertensive chronic kidney disease with stage 1 through stage 4 chronic kidney disease, or unspecified chronic kidney disease: Secondary | ICD-10-CM | POA: Diagnosis not present

## 2017-03-10 DIAGNOSIS — M5136 Other intervertebral disc degeneration, lumbar region: Secondary | ICD-10-CM | POA: Diagnosis not present

## 2017-03-10 DIAGNOSIS — E1122 Type 2 diabetes mellitus with diabetic chronic kidney disease: Secondary | ICD-10-CM | POA: Diagnosis not present

## 2017-03-10 DIAGNOSIS — S76012D Strain of muscle, fascia and tendon of left hip, subsequent encounter: Secondary | ICD-10-CM | POA: Diagnosis not present

## 2017-03-10 DIAGNOSIS — N189 Chronic kidney disease, unspecified: Secondary | ICD-10-CM | POA: Diagnosis not present

## 2017-03-15 DIAGNOSIS — I10 Essential (primary) hypertension: Secondary | ICD-10-CM | POA: Diagnosis not present

## 2017-03-15 DIAGNOSIS — S76012D Strain of muscle, fascia and tendon of left hip, subsequent encounter: Secondary | ICD-10-CM | POA: Diagnosis not present

## 2017-03-15 DIAGNOSIS — E872 Acidosis: Secondary | ICD-10-CM | POA: Diagnosis not present

## 2017-03-15 DIAGNOSIS — E1129 Type 2 diabetes mellitus with other diabetic kidney complication: Secondary | ICD-10-CM | POA: Diagnosis not present

## 2017-03-15 DIAGNOSIS — N184 Chronic kidney disease, stage 4 (severe): Secondary | ICD-10-CM | POA: Diagnosis not present

## 2017-03-15 DIAGNOSIS — N189 Chronic kidney disease, unspecified: Secondary | ICD-10-CM | POA: Diagnosis not present

## 2017-03-15 DIAGNOSIS — I69354 Hemiplegia and hemiparesis following cerebral infarction affecting left non-dominant side: Secondary | ICD-10-CM | POA: Diagnosis not present

## 2017-03-15 DIAGNOSIS — I129 Hypertensive chronic kidney disease with stage 1 through stage 4 chronic kidney disease, or unspecified chronic kidney disease: Secondary | ICD-10-CM | POA: Diagnosis not present

## 2017-03-15 DIAGNOSIS — R809 Proteinuria, unspecified: Secondary | ICD-10-CM | POA: Diagnosis not present

## 2017-03-15 DIAGNOSIS — I509 Heart failure, unspecified: Secondary | ICD-10-CM | POA: Diagnosis not present

## 2017-03-15 DIAGNOSIS — M5136 Other intervertebral disc degeneration, lumbar region: Secondary | ICD-10-CM | POA: Diagnosis not present

## 2017-03-15 DIAGNOSIS — E1122 Type 2 diabetes mellitus with diabetic chronic kidney disease: Secondary | ICD-10-CM | POA: Diagnosis not present

## 2017-03-15 DIAGNOSIS — Z794 Long term (current) use of insulin: Secondary | ICD-10-CM | POA: Diagnosis not present

## 2017-03-16 DIAGNOSIS — N189 Chronic kidney disease, unspecified: Secondary | ICD-10-CM | POA: Diagnosis not present

## 2017-03-16 DIAGNOSIS — M5136 Other intervertebral disc degeneration, lumbar region: Secondary | ICD-10-CM | POA: Diagnosis not present

## 2017-03-16 DIAGNOSIS — S76012D Strain of muscle, fascia and tendon of left hip, subsequent encounter: Secondary | ICD-10-CM | POA: Diagnosis not present

## 2017-03-16 DIAGNOSIS — I129 Hypertensive chronic kidney disease with stage 1 through stage 4 chronic kidney disease, or unspecified chronic kidney disease: Secondary | ICD-10-CM | POA: Diagnosis not present

## 2017-03-16 DIAGNOSIS — I69354 Hemiplegia and hemiparesis following cerebral infarction affecting left non-dominant side: Secondary | ICD-10-CM | POA: Diagnosis not present

## 2017-03-16 DIAGNOSIS — E1122 Type 2 diabetes mellitus with diabetic chronic kidney disease: Secondary | ICD-10-CM | POA: Diagnosis not present

## 2017-03-17 DIAGNOSIS — D631 Anemia in chronic kidney disease: Secondary | ICD-10-CM | POA: Diagnosis not present

## 2017-03-17 DIAGNOSIS — I129 Hypertensive chronic kidney disease with stage 1 through stage 4 chronic kidney disease, or unspecified chronic kidney disease: Secondary | ICD-10-CM | POA: Diagnosis not present

## 2017-03-17 DIAGNOSIS — N184 Chronic kidney disease, stage 4 (severe): Secondary | ICD-10-CM | POA: Diagnosis not present

## 2017-03-17 DIAGNOSIS — M5136 Other intervertebral disc degeneration, lumbar region: Secondary | ICD-10-CM | POA: Diagnosis not present

## 2017-03-17 DIAGNOSIS — N189 Chronic kidney disease, unspecified: Secondary | ICD-10-CM | POA: Diagnosis not present

## 2017-03-17 DIAGNOSIS — I69354 Hemiplegia and hemiparesis following cerebral infarction affecting left non-dominant side: Secondary | ICD-10-CM | POA: Diagnosis not present

## 2017-03-17 DIAGNOSIS — S76012D Strain of muscle, fascia and tendon of left hip, subsequent encounter: Secondary | ICD-10-CM | POA: Diagnosis not present

## 2017-03-17 DIAGNOSIS — E1122 Type 2 diabetes mellitus with diabetic chronic kidney disease: Secondary | ICD-10-CM | POA: Diagnosis not present

## 2017-03-21 DIAGNOSIS — N189 Chronic kidney disease, unspecified: Secondary | ICD-10-CM | POA: Diagnosis not present

## 2017-03-21 DIAGNOSIS — I129 Hypertensive chronic kidney disease with stage 1 through stage 4 chronic kidney disease, or unspecified chronic kidney disease: Secondary | ICD-10-CM | POA: Diagnosis not present

## 2017-03-21 DIAGNOSIS — M5136 Other intervertebral disc degeneration, lumbar region: Secondary | ICD-10-CM | POA: Diagnosis not present

## 2017-03-21 DIAGNOSIS — E1122 Type 2 diabetes mellitus with diabetic chronic kidney disease: Secondary | ICD-10-CM | POA: Diagnosis not present

## 2017-03-21 DIAGNOSIS — S76012D Strain of muscle, fascia and tendon of left hip, subsequent encounter: Secondary | ICD-10-CM | POA: Diagnosis not present

## 2017-03-21 DIAGNOSIS — I69354 Hemiplegia and hemiparesis following cerebral infarction affecting left non-dominant side: Secondary | ICD-10-CM | POA: Diagnosis not present

## 2017-03-22 DIAGNOSIS — I69354 Hemiplegia and hemiparesis following cerebral infarction affecting left non-dominant side: Secondary | ICD-10-CM | POA: Diagnosis not present

## 2017-03-22 DIAGNOSIS — S76012D Strain of muscle, fascia and tendon of left hip, subsequent encounter: Secondary | ICD-10-CM | POA: Diagnosis not present

## 2017-03-22 DIAGNOSIS — N189 Chronic kidney disease, unspecified: Secondary | ICD-10-CM | POA: Diagnosis not present

## 2017-03-22 DIAGNOSIS — M5136 Other intervertebral disc degeneration, lumbar region: Secondary | ICD-10-CM | POA: Diagnosis not present

## 2017-03-22 DIAGNOSIS — I129 Hypertensive chronic kidney disease with stage 1 through stage 4 chronic kidney disease, or unspecified chronic kidney disease: Secondary | ICD-10-CM | POA: Diagnosis not present

## 2017-03-22 DIAGNOSIS — E1122 Type 2 diabetes mellitus with diabetic chronic kidney disease: Secondary | ICD-10-CM | POA: Diagnosis not present

## 2017-03-23 DIAGNOSIS — I69354 Hemiplegia and hemiparesis following cerebral infarction affecting left non-dominant side: Secondary | ICD-10-CM | POA: Diagnosis not present

## 2017-03-23 DIAGNOSIS — S76012D Strain of muscle, fascia and tendon of left hip, subsequent encounter: Secondary | ICD-10-CM | POA: Diagnosis not present

## 2017-03-23 DIAGNOSIS — E1122 Type 2 diabetes mellitus with diabetic chronic kidney disease: Secondary | ICD-10-CM | POA: Diagnosis not present

## 2017-03-23 DIAGNOSIS — I129 Hypertensive chronic kidney disease with stage 1 through stage 4 chronic kidney disease, or unspecified chronic kidney disease: Secondary | ICD-10-CM | POA: Diagnosis not present

## 2017-03-23 DIAGNOSIS — N189 Chronic kidney disease, unspecified: Secondary | ICD-10-CM | POA: Diagnosis not present

## 2017-03-23 DIAGNOSIS — M5136 Other intervertebral disc degeneration, lumbar region: Secondary | ICD-10-CM | POA: Diagnosis not present

## 2017-03-24 DIAGNOSIS — I129 Hypertensive chronic kidney disease with stage 1 through stage 4 chronic kidney disease, or unspecified chronic kidney disease: Secondary | ICD-10-CM | POA: Diagnosis not present

## 2017-03-24 DIAGNOSIS — M5136 Other intervertebral disc degeneration, lumbar region: Secondary | ICD-10-CM | POA: Diagnosis not present

## 2017-03-24 DIAGNOSIS — E1122 Type 2 diabetes mellitus with diabetic chronic kidney disease: Secondary | ICD-10-CM | POA: Diagnosis not present

## 2017-03-24 DIAGNOSIS — N189 Chronic kidney disease, unspecified: Secondary | ICD-10-CM | POA: Diagnosis not present

## 2017-03-24 DIAGNOSIS — S76012D Strain of muscle, fascia and tendon of left hip, subsequent encounter: Secondary | ICD-10-CM | POA: Diagnosis not present

## 2017-03-24 DIAGNOSIS — I69354 Hemiplegia and hemiparesis following cerebral infarction affecting left non-dominant side: Secondary | ICD-10-CM | POA: Diagnosis not present

## 2017-03-28 DIAGNOSIS — S76012D Strain of muscle, fascia and tendon of left hip, subsequent encounter: Secondary | ICD-10-CM | POA: Diagnosis not present

## 2017-03-28 DIAGNOSIS — I129 Hypertensive chronic kidney disease with stage 1 through stage 4 chronic kidney disease, or unspecified chronic kidney disease: Secondary | ICD-10-CM | POA: Diagnosis not present

## 2017-03-28 DIAGNOSIS — I69354 Hemiplegia and hemiparesis following cerebral infarction affecting left non-dominant side: Secondary | ICD-10-CM | POA: Diagnosis not present

## 2017-03-28 DIAGNOSIS — N189 Chronic kidney disease, unspecified: Secondary | ICD-10-CM | POA: Diagnosis not present

## 2017-03-28 DIAGNOSIS — E1122 Type 2 diabetes mellitus with diabetic chronic kidney disease: Secondary | ICD-10-CM | POA: Diagnosis not present

## 2017-03-28 DIAGNOSIS — M5136 Other intervertebral disc degeneration, lumbar region: Secondary | ICD-10-CM | POA: Diagnosis not present

## 2017-03-29 DIAGNOSIS — I129 Hypertensive chronic kidney disease with stage 1 through stage 4 chronic kidney disease, or unspecified chronic kidney disease: Secondary | ICD-10-CM | POA: Diagnosis not present

## 2017-03-29 DIAGNOSIS — E1122 Type 2 diabetes mellitus with diabetic chronic kidney disease: Secondary | ICD-10-CM | POA: Diagnosis not present

## 2017-03-29 DIAGNOSIS — N189 Chronic kidney disease, unspecified: Secondary | ICD-10-CM | POA: Diagnosis not present

## 2017-03-29 DIAGNOSIS — S76012D Strain of muscle, fascia and tendon of left hip, subsequent encounter: Secondary | ICD-10-CM | POA: Diagnosis not present

## 2017-03-29 DIAGNOSIS — M5136 Other intervertebral disc degeneration, lumbar region: Secondary | ICD-10-CM | POA: Diagnosis not present

## 2017-03-29 DIAGNOSIS — I69354 Hemiplegia and hemiparesis following cerebral infarction affecting left non-dominant side: Secondary | ICD-10-CM | POA: Diagnosis not present

## 2017-03-30 DIAGNOSIS — I69354 Hemiplegia and hemiparesis following cerebral infarction affecting left non-dominant side: Secondary | ICD-10-CM | POA: Diagnosis not present

## 2017-03-30 DIAGNOSIS — I129 Hypertensive chronic kidney disease with stage 1 through stage 4 chronic kidney disease, or unspecified chronic kidney disease: Secondary | ICD-10-CM | POA: Diagnosis not present

## 2017-03-30 DIAGNOSIS — M5136 Other intervertebral disc degeneration, lumbar region: Secondary | ICD-10-CM | POA: Diagnosis not present

## 2017-03-30 DIAGNOSIS — S76012D Strain of muscle, fascia and tendon of left hip, subsequent encounter: Secondary | ICD-10-CM | POA: Diagnosis not present

## 2017-03-30 DIAGNOSIS — N189 Chronic kidney disease, unspecified: Secondary | ICD-10-CM | POA: Diagnosis not present

## 2017-03-30 DIAGNOSIS — E1122 Type 2 diabetes mellitus with diabetic chronic kidney disease: Secondary | ICD-10-CM | POA: Diagnosis not present

## 2017-03-31 DIAGNOSIS — N189 Chronic kidney disease, unspecified: Secondary | ICD-10-CM | POA: Diagnosis not present

## 2017-03-31 DIAGNOSIS — E1122 Type 2 diabetes mellitus with diabetic chronic kidney disease: Secondary | ICD-10-CM | POA: Diagnosis not present

## 2017-03-31 DIAGNOSIS — I69354 Hemiplegia and hemiparesis following cerebral infarction affecting left non-dominant side: Secondary | ICD-10-CM | POA: Diagnosis not present

## 2017-03-31 DIAGNOSIS — I129 Hypertensive chronic kidney disease with stage 1 through stage 4 chronic kidney disease, or unspecified chronic kidney disease: Secondary | ICD-10-CM | POA: Diagnosis not present

## 2017-03-31 DIAGNOSIS — S76012D Strain of muscle, fascia and tendon of left hip, subsequent encounter: Secondary | ICD-10-CM | POA: Diagnosis not present

## 2017-03-31 DIAGNOSIS — N184 Chronic kidney disease, stage 4 (severe): Secondary | ICD-10-CM | POA: Diagnosis not present

## 2017-03-31 DIAGNOSIS — M5136 Other intervertebral disc degeneration, lumbar region: Secondary | ICD-10-CM | POA: Diagnosis not present

## 2017-03-31 DIAGNOSIS — D631 Anemia in chronic kidney disease: Secondary | ICD-10-CM | POA: Diagnosis not present

## 2017-04-01 DIAGNOSIS — N19 Unspecified kidney failure: Secondary | ICD-10-CM | POA: Diagnosis not present

## 2017-04-01 DIAGNOSIS — D638 Anemia in other chronic diseases classified elsewhere: Secondary | ICD-10-CM | POA: Diagnosis not present

## 2017-04-01 DIAGNOSIS — Z299 Encounter for prophylactic measures, unspecified: Secondary | ICD-10-CM | POA: Diagnosis not present

## 2017-04-01 DIAGNOSIS — E1122 Type 2 diabetes mellitus with diabetic chronic kidney disease: Secondary | ICD-10-CM | POA: Diagnosis not present

## 2017-04-01 DIAGNOSIS — I5030 Unspecified diastolic (congestive) heart failure: Secondary | ICD-10-CM | POA: Diagnosis not present

## 2017-04-04 DIAGNOSIS — N189 Chronic kidney disease, unspecified: Secondary | ICD-10-CM | POA: Diagnosis not present

## 2017-04-04 DIAGNOSIS — S76012D Strain of muscle, fascia and tendon of left hip, subsequent encounter: Secondary | ICD-10-CM | POA: Diagnosis not present

## 2017-04-04 DIAGNOSIS — I69354 Hemiplegia and hemiparesis following cerebral infarction affecting left non-dominant side: Secondary | ICD-10-CM | POA: Diagnosis not present

## 2017-04-04 DIAGNOSIS — M5136 Other intervertebral disc degeneration, lumbar region: Secondary | ICD-10-CM | POA: Diagnosis not present

## 2017-04-04 DIAGNOSIS — I129 Hypertensive chronic kidney disease with stage 1 through stage 4 chronic kidney disease, or unspecified chronic kidney disease: Secondary | ICD-10-CM | POA: Diagnosis not present

## 2017-04-04 DIAGNOSIS — E1122 Type 2 diabetes mellitus with diabetic chronic kidney disease: Secondary | ICD-10-CM | POA: Diagnosis not present

## 2017-04-05 DIAGNOSIS — M5136 Other intervertebral disc degeneration, lumbar region: Secondary | ICD-10-CM | POA: Diagnosis not present

## 2017-04-05 DIAGNOSIS — N189 Chronic kidney disease, unspecified: Secondary | ICD-10-CM | POA: Diagnosis not present

## 2017-04-05 DIAGNOSIS — I69354 Hemiplegia and hemiparesis following cerebral infarction affecting left non-dominant side: Secondary | ICD-10-CM | POA: Diagnosis not present

## 2017-04-05 DIAGNOSIS — S76012D Strain of muscle, fascia and tendon of left hip, subsequent encounter: Secondary | ICD-10-CM | POA: Diagnosis not present

## 2017-04-05 DIAGNOSIS — I129 Hypertensive chronic kidney disease with stage 1 through stage 4 chronic kidney disease, or unspecified chronic kidney disease: Secondary | ICD-10-CM | POA: Diagnosis not present

## 2017-04-05 DIAGNOSIS — E1122 Type 2 diabetes mellitus with diabetic chronic kidney disease: Secondary | ICD-10-CM | POA: Diagnosis not present

## 2017-04-06 DIAGNOSIS — S76012D Strain of muscle, fascia and tendon of left hip, subsequent encounter: Secondary | ICD-10-CM | POA: Diagnosis not present

## 2017-04-06 DIAGNOSIS — N189 Chronic kidney disease, unspecified: Secondary | ICD-10-CM | POA: Diagnosis not present

## 2017-04-06 DIAGNOSIS — I69354 Hemiplegia and hemiparesis following cerebral infarction affecting left non-dominant side: Secondary | ICD-10-CM | POA: Diagnosis not present

## 2017-04-06 DIAGNOSIS — E1122 Type 2 diabetes mellitus with diabetic chronic kidney disease: Secondary | ICD-10-CM | POA: Diagnosis not present

## 2017-04-06 DIAGNOSIS — I129 Hypertensive chronic kidney disease with stage 1 through stage 4 chronic kidney disease, or unspecified chronic kidney disease: Secondary | ICD-10-CM | POA: Diagnosis not present

## 2017-04-06 DIAGNOSIS — M5136 Other intervertebral disc degeneration, lumbar region: Secondary | ICD-10-CM | POA: Diagnosis not present

## 2017-04-07 DIAGNOSIS — E1122 Type 2 diabetes mellitus with diabetic chronic kidney disease: Secondary | ICD-10-CM | POA: Diagnosis not present

## 2017-04-07 DIAGNOSIS — I129 Hypertensive chronic kidney disease with stage 1 through stage 4 chronic kidney disease, or unspecified chronic kidney disease: Secondary | ICD-10-CM | POA: Diagnosis not present

## 2017-04-07 DIAGNOSIS — I69354 Hemiplegia and hemiparesis following cerebral infarction affecting left non-dominant side: Secondary | ICD-10-CM | POA: Diagnosis not present

## 2017-04-07 DIAGNOSIS — N189 Chronic kidney disease, unspecified: Secondary | ICD-10-CM | POA: Diagnosis not present

## 2017-04-07 DIAGNOSIS — S76012D Strain of muscle, fascia and tendon of left hip, subsequent encounter: Secondary | ICD-10-CM | POA: Diagnosis not present

## 2017-04-07 DIAGNOSIS — M5136 Other intervertebral disc degeneration, lumbar region: Secondary | ICD-10-CM | POA: Diagnosis not present

## 2017-04-11 DIAGNOSIS — N189 Chronic kidney disease, unspecified: Secondary | ICD-10-CM | POA: Diagnosis not present

## 2017-04-11 DIAGNOSIS — S76012D Strain of muscle, fascia and tendon of left hip, subsequent encounter: Secondary | ICD-10-CM | POA: Diagnosis not present

## 2017-04-11 DIAGNOSIS — I129 Hypertensive chronic kidney disease with stage 1 through stage 4 chronic kidney disease, or unspecified chronic kidney disease: Secondary | ICD-10-CM | POA: Diagnosis not present

## 2017-04-11 DIAGNOSIS — M5136 Other intervertebral disc degeneration, lumbar region: Secondary | ICD-10-CM | POA: Diagnosis not present

## 2017-04-11 DIAGNOSIS — E1122 Type 2 diabetes mellitus with diabetic chronic kidney disease: Secondary | ICD-10-CM | POA: Diagnosis not present

## 2017-04-11 DIAGNOSIS — I69354 Hemiplegia and hemiparesis following cerebral infarction affecting left non-dominant side: Secondary | ICD-10-CM | POA: Diagnosis not present

## 2017-04-13 DIAGNOSIS — S76012D Strain of muscle, fascia and tendon of left hip, subsequent encounter: Secondary | ICD-10-CM | POA: Diagnosis not present

## 2017-04-13 DIAGNOSIS — N189 Chronic kidney disease, unspecified: Secondary | ICD-10-CM | POA: Diagnosis not present

## 2017-04-13 DIAGNOSIS — I69354 Hemiplegia and hemiparesis following cerebral infarction affecting left non-dominant side: Secondary | ICD-10-CM | POA: Diagnosis not present

## 2017-04-13 DIAGNOSIS — E1122 Type 2 diabetes mellitus with diabetic chronic kidney disease: Secondary | ICD-10-CM | POA: Diagnosis not present

## 2017-04-13 DIAGNOSIS — M5136 Other intervertebral disc degeneration, lumbar region: Secondary | ICD-10-CM | POA: Diagnosis not present

## 2017-04-13 DIAGNOSIS — I129 Hypertensive chronic kidney disease with stage 1 through stage 4 chronic kidney disease, or unspecified chronic kidney disease: Secondary | ICD-10-CM | POA: Diagnosis not present

## 2017-04-15 DIAGNOSIS — D631 Anemia in chronic kidney disease: Secondary | ICD-10-CM | POA: Diagnosis not present

## 2017-04-15 DIAGNOSIS — N184 Chronic kidney disease, stage 4 (severe): Secondary | ICD-10-CM | POA: Diagnosis not present

## 2017-04-18 DIAGNOSIS — S76012D Strain of muscle, fascia and tendon of left hip, subsequent encounter: Secondary | ICD-10-CM | POA: Diagnosis not present

## 2017-04-18 DIAGNOSIS — I129 Hypertensive chronic kidney disease with stage 1 through stage 4 chronic kidney disease, or unspecified chronic kidney disease: Secondary | ICD-10-CM | POA: Diagnosis not present

## 2017-04-18 DIAGNOSIS — N189 Chronic kidney disease, unspecified: Secondary | ICD-10-CM | POA: Diagnosis not present

## 2017-04-18 DIAGNOSIS — M5136 Other intervertebral disc degeneration, lumbar region: Secondary | ICD-10-CM | POA: Diagnosis not present

## 2017-04-18 DIAGNOSIS — I69354 Hemiplegia and hemiparesis following cerebral infarction affecting left non-dominant side: Secondary | ICD-10-CM | POA: Diagnosis not present

## 2017-04-18 DIAGNOSIS — I639 Cerebral infarction, unspecified: Secondary | ICD-10-CM | POA: Diagnosis not present

## 2017-04-18 DIAGNOSIS — E1122 Type 2 diabetes mellitus with diabetic chronic kidney disease: Secondary | ICD-10-CM | POA: Diagnosis not present

## 2017-04-18 DIAGNOSIS — M159 Polyosteoarthritis, unspecified: Secondary | ICD-10-CM | POA: Diagnosis not present

## 2017-04-18 DIAGNOSIS — E119 Type 2 diabetes mellitus without complications: Secondary | ICD-10-CM | POA: Diagnosis not present

## 2017-04-19 DIAGNOSIS — N189 Chronic kidney disease, unspecified: Secondary | ICD-10-CM | POA: Diagnosis not present

## 2017-04-19 DIAGNOSIS — I129 Hypertensive chronic kidney disease with stage 1 through stage 4 chronic kidney disease, or unspecified chronic kidney disease: Secondary | ICD-10-CM | POA: Diagnosis not present

## 2017-04-19 DIAGNOSIS — S76012D Strain of muscle, fascia and tendon of left hip, subsequent encounter: Secondary | ICD-10-CM | POA: Diagnosis not present

## 2017-04-19 DIAGNOSIS — M5136 Other intervertebral disc degeneration, lumbar region: Secondary | ICD-10-CM | POA: Diagnosis not present

## 2017-04-19 DIAGNOSIS — I69354 Hemiplegia and hemiparesis following cerebral infarction affecting left non-dominant side: Secondary | ICD-10-CM | POA: Diagnosis not present

## 2017-04-19 DIAGNOSIS — E1122 Type 2 diabetes mellitus with diabetic chronic kidney disease: Secondary | ICD-10-CM | POA: Diagnosis not present

## 2017-04-25 DIAGNOSIS — M169 Osteoarthritis of hip, unspecified: Secondary | ICD-10-CM | POA: Diagnosis not present

## 2017-04-25 DIAGNOSIS — S76012D Strain of muscle, fascia and tendon of left hip, subsequent encounter: Secondary | ICD-10-CM | POA: Diagnosis not present

## 2017-04-25 DIAGNOSIS — I129 Hypertensive chronic kidney disease with stage 1 through stage 4 chronic kidney disease, or unspecified chronic kidney disease: Secondary | ICD-10-CM | POA: Diagnosis not present

## 2017-04-25 DIAGNOSIS — Z299 Encounter for prophylactic measures, unspecified: Secondary | ICD-10-CM | POA: Diagnosis not present

## 2017-04-25 DIAGNOSIS — E1122 Type 2 diabetes mellitus with diabetic chronic kidney disease: Secondary | ICD-10-CM | POA: Diagnosis not present

## 2017-04-25 DIAGNOSIS — I5030 Unspecified diastolic (congestive) heart failure: Secondary | ICD-10-CM | POA: Diagnosis not present

## 2017-04-25 DIAGNOSIS — Z6830 Body mass index (BMI) 30.0-30.9, adult: Secondary | ICD-10-CM | POA: Diagnosis not present

## 2017-04-25 DIAGNOSIS — N189 Chronic kidney disease, unspecified: Secondary | ICD-10-CM | POA: Diagnosis not present

## 2017-04-25 DIAGNOSIS — I69354 Hemiplegia and hemiparesis following cerebral infarction affecting left non-dominant side: Secondary | ICD-10-CM | POA: Diagnosis not present

## 2017-04-25 DIAGNOSIS — N184 Chronic kidney disease, stage 4 (severe): Secondary | ICD-10-CM | POA: Diagnosis not present

## 2017-04-25 DIAGNOSIS — I351 Nonrheumatic aortic (valve) insufficiency: Secondary | ICD-10-CM | POA: Diagnosis not present

## 2017-04-25 DIAGNOSIS — C61 Malignant neoplasm of prostate: Secondary | ICD-10-CM | POA: Diagnosis not present

## 2017-04-25 DIAGNOSIS — M5136 Other intervertebral disc degeneration, lumbar region: Secondary | ICD-10-CM | POA: Diagnosis not present

## 2017-04-25 DIAGNOSIS — M1710 Unilateral primary osteoarthritis, unspecified knee: Secondary | ICD-10-CM | POA: Diagnosis not present

## 2017-04-27 DIAGNOSIS — N189 Chronic kidney disease, unspecified: Secondary | ICD-10-CM | POA: Diagnosis not present

## 2017-04-27 DIAGNOSIS — S76012D Strain of muscle, fascia and tendon of left hip, subsequent encounter: Secondary | ICD-10-CM | POA: Diagnosis not present

## 2017-04-27 DIAGNOSIS — M5136 Other intervertebral disc degeneration, lumbar region: Secondary | ICD-10-CM | POA: Diagnosis not present

## 2017-04-27 DIAGNOSIS — I129 Hypertensive chronic kidney disease with stage 1 through stage 4 chronic kidney disease, or unspecified chronic kidney disease: Secondary | ICD-10-CM | POA: Diagnosis not present

## 2017-04-27 DIAGNOSIS — E1122 Type 2 diabetes mellitus with diabetic chronic kidney disease: Secondary | ICD-10-CM | POA: Diagnosis not present

## 2017-04-27 DIAGNOSIS — I69354 Hemiplegia and hemiparesis following cerebral infarction affecting left non-dominant side: Secondary | ICD-10-CM | POA: Diagnosis not present

## 2017-04-27 DIAGNOSIS — E1140 Type 2 diabetes mellitus with diabetic neuropathy, unspecified: Secondary | ICD-10-CM | POA: Diagnosis not present

## 2017-04-27 DIAGNOSIS — E1151 Type 2 diabetes mellitus with diabetic peripheral angiopathy without gangrene: Secondary | ICD-10-CM | POA: Diagnosis not present

## 2017-04-28 DIAGNOSIS — D631 Anemia in chronic kidney disease: Secondary | ICD-10-CM | POA: Diagnosis not present

## 2017-04-28 DIAGNOSIS — N184 Chronic kidney disease, stage 4 (severe): Secondary | ICD-10-CM | POA: Diagnosis not present

## 2017-05-02 DIAGNOSIS — Z79899 Other long term (current) drug therapy: Secondary | ICD-10-CM | POA: Diagnosis not present

## 2017-05-02 DIAGNOSIS — Z8673 Personal history of transient ischemic attack (TIA), and cerebral infarction without residual deficits: Secondary | ICD-10-CM | POA: Diagnosis not present

## 2017-05-02 DIAGNOSIS — I724 Aneurysm of artery of lower extremity: Secondary | ICD-10-CM | POA: Diagnosis not present

## 2017-05-02 DIAGNOSIS — Z833 Family history of diabetes mellitus: Secondary | ICD-10-CM | POA: Diagnosis not present

## 2017-05-02 DIAGNOSIS — M25552 Pain in left hip: Secondary | ICD-10-CM | POA: Diagnosis not present

## 2017-05-02 DIAGNOSIS — E039 Hypothyroidism, unspecified: Secondary | ICD-10-CM | POA: Diagnosis not present

## 2017-05-02 DIAGNOSIS — E119 Type 2 diabetes mellitus without complications: Secondary | ICD-10-CM | POA: Diagnosis not present

## 2017-05-02 DIAGNOSIS — Z8546 Personal history of malignant neoplasm of prostate: Secondary | ICD-10-CM | POA: Diagnosis not present

## 2017-05-02 DIAGNOSIS — Z794 Long term (current) use of insulin: Secondary | ICD-10-CM | POA: Diagnosis not present

## 2017-05-02 DIAGNOSIS — M5432 Sciatica, left side: Secondary | ICD-10-CM | POA: Diagnosis not present

## 2017-05-02 DIAGNOSIS — I728 Aneurysm of other specified arteries: Secondary | ICD-10-CM | POA: Diagnosis not present

## 2017-05-02 DIAGNOSIS — M545 Low back pain: Secondary | ICD-10-CM | POA: Diagnosis not present

## 2017-05-03 DIAGNOSIS — I129 Hypertensive chronic kidney disease with stage 1 through stage 4 chronic kidney disease, or unspecified chronic kidney disease: Secondary | ICD-10-CM | POA: Diagnosis not present

## 2017-05-03 DIAGNOSIS — E1122 Type 2 diabetes mellitus with diabetic chronic kidney disease: Secondary | ICD-10-CM | POA: Diagnosis not present

## 2017-05-03 DIAGNOSIS — N189 Chronic kidney disease, unspecified: Secondary | ICD-10-CM | POA: Diagnosis not present

## 2017-05-03 DIAGNOSIS — I69354 Hemiplegia and hemiparesis following cerebral infarction affecting left non-dominant side: Secondary | ICD-10-CM | POA: Diagnosis not present

## 2017-05-03 DIAGNOSIS — M5136 Other intervertebral disc degeneration, lumbar region: Secondary | ICD-10-CM | POA: Diagnosis not present

## 2017-05-03 DIAGNOSIS — S76012D Strain of muscle, fascia and tendon of left hip, subsequent encounter: Secondary | ICD-10-CM | POA: Diagnosis not present

## 2017-05-11 DIAGNOSIS — I724 Aneurysm of artery of lower extremity: Secondary | ICD-10-CM | POA: Diagnosis not present

## 2017-05-12 DIAGNOSIS — R809 Proteinuria, unspecified: Secondary | ICD-10-CM | POA: Diagnosis not present

## 2017-05-12 DIAGNOSIS — Z79899 Other long term (current) drug therapy: Secondary | ICD-10-CM | POA: Diagnosis not present

## 2017-05-12 DIAGNOSIS — E559 Vitamin D deficiency, unspecified: Secondary | ICD-10-CM | POA: Diagnosis not present

## 2017-05-12 DIAGNOSIS — D631 Anemia in chronic kidney disease: Secondary | ICD-10-CM | POA: Diagnosis not present

## 2017-05-12 DIAGNOSIS — N184 Chronic kidney disease, stage 4 (severe): Secondary | ICD-10-CM | POA: Diagnosis not present

## 2017-05-13 DIAGNOSIS — E1165 Type 2 diabetes mellitus with hyperglycemia: Secondary | ICD-10-CM | POA: Diagnosis not present

## 2017-05-13 DIAGNOSIS — C61 Malignant neoplasm of prostate: Secondary | ICD-10-CM | POA: Diagnosis not present

## 2017-05-13 DIAGNOSIS — K219 Gastro-esophageal reflux disease without esophagitis: Secondary | ICD-10-CM | POA: Diagnosis not present

## 2017-05-13 DIAGNOSIS — Z6830 Body mass index (BMI) 30.0-30.9, adult: Secondary | ICD-10-CM | POA: Diagnosis not present

## 2017-05-13 DIAGNOSIS — Z299 Encounter for prophylactic measures, unspecified: Secondary | ICD-10-CM | POA: Diagnosis not present

## 2017-05-13 DIAGNOSIS — I723 Aneurysm of iliac artery: Secondary | ICD-10-CM | POA: Diagnosis not present

## 2017-05-13 DIAGNOSIS — M25552 Pain in left hip: Secondary | ICD-10-CM | POA: Diagnosis not present

## 2017-05-13 DIAGNOSIS — N184 Chronic kidney disease, stage 4 (severe): Secondary | ICD-10-CM | POA: Diagnosis not present

## 2017-05-25 DIAGNOSIS — I639 Cerebral infarction, unspecified: Secondary | ICD-10-CM | POA: Diagnosis not present

## 2017-05-25 DIAGNOSIS — M159 Polyosteoarthritis, unspecified: Secondary | ICD-10-CM | POA: Diagnosis not present

## 2017-05-25 DIAGNOSIS — E119 Type 2 diabetes mellitus without complications: Secondary | ICD-10-CM | POA: Diagnosis not present

## 2017-05-27 DIAGNOSIS — Z79899 Other long term (current) drug therapy: Secondary | ICD-10-CM | POA: Diagnosis not present

## 2017-05-27 DIAGNOSIS — D631 Anemia in chronic kidney disease: Secondary | ICD-10-CM | POA: Diagnosis not present

## 2017-05-27 DIAGNOSIS — R809 Proteinuria, unspecified: Secondary | ICD-10-CM | POA: Diagnosis not present

## 2017-05-27 DIAGNOSIS — N184 Chronic kidney disease, stage 4 (severe): Secondary | ICD-10-CM | POA: Diagnosis not present

## 2017-05-27 DIAGNOSIS — E559 Vitamin D deficiency, unspecified: Secondary | ICD-10-CM | POA: Diagnosis not present

## 2017-05-31 DIAGNOSIS — R809 Proteinuria, unspecified: Secondary | ICD-10-CM | POA: Diagnosis not present

## 2017-05-31 DIAGNOSIS — N184 Chronic kidney disease, stage 4 (severe): Secondary | ICD-10-CM | POA: Diagnosis not present

## 2017-05-31 DIAGNOSIS — I10 Essential (primary) hypertension: Secondary | ICD-10-CM | POA: Diagnosis not present

## 2017-05-31 DIAGNOSIS — E1129 Type 2 diabetes mellitus with other diabetic kidney complication: Secondary | ICD-10-CM | POA: Diagnosis not present

## 2017-05-31 DIAGNOSIS — D638 Anemia in other chronic diseases classified elsewhere: Secondary | ICD-10-CM | POA: Diagnosis not present

## 2017-06-09 DIAGNOSIS — Z79899 Other long term (current) drug therapy: Secondary | ICD-10-CM | POA: Diagnosis not present

## 2017-06-09 DIAGNOSIS — R809 Proteinuria, unspecified: Secondary | ICD-10-CM | POA: Diagnosis not present

## 2017-06-09 DIAGNOSIS — D631 Anemia in chronic kidney disease: Secondary | ICD-10-CM | POA: Diagnosis not present

## 2017-06-09 DIAGNOSIS — E559 Vitamin D deficiency, unspecified: Secondary | ICD-10-CM | POA: Diagnosis not present

## 2017-06-09 DIAGNOSIS — N184 Chronic kidney disease, stage 4 (severe): Secondary | ICD-10-CM | POA: Diagnosis not present

## 2017-06-14 DIAGNOSIS — Z23 Encounter for immunization: Secondary | ICD-10-CM | POA: Diagnosis not present

## 2017-06-20 DIAGNOSIS — M159 Polyosteoarthritis, unspecified: Secondary | ICD-10-CM | POA: Diagnosis not present

## 2017-06-20 DIAGNOSIS — I639 Cerebral infarction, unspecified: Secondary | ICD-10-CM | POA: Diagnosis not present

## 2017-06-20 DIAGNOSIS — E119 Type 2 diabetes mellitus without complications: Secondary | ICD-10-CM | POA: Diagnosis not present

## 2017-06-23 DIAGNOSIS — E559 Vitamin D deficiency, unspecified: Secondary | ICD-10-CM | POA: Diagnosis not present

## 2017-06-23 DIAGNOSIS — R809 Proteinuria, unspecified: Secondary | ICD-10-CM | POA: Diagnosis not present

## 2017-06-23 DIAGNOSIS — N184 Chronic kidney disease, stage 4 (severe): Secondary | ICD-10-CM | POA: Diagnosis not present

## 2017-06-23 DIAGNOSIS — Z79899 Other long term (current) drug therapy: Secondary | ICD-10-CM | POA: Diagnosis not present

## 2017-06-23 DIAGNOSIS — D631 Anemia in chronic kidney disease: Secondary | ICD-10-CM | POA: Diagnosis not present

## 2017-06-30 DIAGNOSIS — M79606 Pain in leg, unspecified: Secondary | ICD-10-CM | POA: Diagnosis not present

## 2017-06-30 DIAGNOSIS — M5432 Sciatica, left side: Secondary | ICD-10-CM | POA: Diagnosis not present

## 2017-06-30 DIAGNOSIS — M25552 Pain in left hip: Secondary | ICD-10-CM | POA: Diagnosis not present

## 2017-06-30 DIAGNOSIS — K5909 Other constipation: Secondary | ICD-10-CM | POA: Diagnosis not present

## 2017-06-30 DIAGNOSIS — M545 Low back pain: Secondary | ICD-10-CM | POA: Diagnosis not present

## 2017-06-30 DIAGNOSIS — Z79891 Long term (current) use of opiate analgesic: Secondary | ICD-10-CM | POA: Diagnosis not present

## 2017-07-05 DIAGNOSIS — R531 Weakness: Secondary | ICD-10-CM | POA: Diagnosis not present

## 2017-07-07 DIAGNOSIS — R809 Proteinuria, unspecified: Secondary | ICD-10-CM | POA: Diagnosis not present

## 2017-07-07 DIAGNOSIS — E559 Vitamin D deficiency, unspecified: Secondary | ICD-10-CM | POA: Diagnosis not present

## 2017-07-07 DIAGNOSIS — D631 Anemia in chronic kidney disease: Secondary | ICD-10-CM | POA: Diagnosis not present

## 2017-07-07 DIAGNOSIS — Z79899 Other long term (current) drug therapy: Secondary | ICD-10-CM | POA: Diagnosis not present

## 2017-07-07 DIAGNOSIS — N184 Chronic kidney disease, stage 4 (severe): Secondary | ICD-10-CM | POA: Diagnosis not present

## 2017-07-13 DIAGNOSIS — E1151 Type 2 diabetes mellitus with diabetic peripheral angiopathy without gangrene: Secondary | ICD-10-CM | POA: Diagnosis not present

## 2017-07-13 DIAGNOSIS — E1140 Type 2 diabetes mellitus with diabetic neuropathy, unspecified: Secondary | ICD-10-CM | POA: Diagnosis not present

## 2017-07-21 DIAGNOSIS — I5030 Unspecified diastolic (congestive) heart failure: Secondary | ICD-10-CM | POA: Diagnosis not present

## 2017-07-21 DIAGNOSIS — M169 Osteoarthritis of hip, unspecified: Secondary | ICD-10-CM | POA: Diagnosis not present

## 2017-07-21 DIAGNOSIS — C61 Malignant neoplasm of prostate: Secondary | ICD-10-CM | POA: Diagnosis not present

## 2017-07-21 DIAGNOSIS — Z299 Encounter for prophylactic measures, unspecified: Secondary | ICD-10-CM | POA: Diagnosis not present

## 2017-07-21 DIAGNOSIS — E1122 Type 2 diabetes mellitus with diabetic chronic kidney disease: Secondary | ICD-10-CM | POA: Diagnosis not present

## 2017-07-21 DIAGNOSIS — R809 Proteinuria, unspecified: Secondary | ICD-10-CM | POA: Diagnosis not present

## 2017-07-21 DIAGNOSIS — Z79899 Other long term (current) drug therapy: Secondary | ICD-10-CM | POA: Diagnosis not present

## 2017-07-21 DIAGNOSIS — Z6830 Body mass index (BMI) 30.0-30.9, adult: Secondary | ICD-10-CM | POA: Diagnosis not present

## 2017-07-21 DIAGNOSIS — I723 Aneurysm of iliac artery: Secondary | ICD-10-CM | POA: Diagnosis not present

## 2017-07-21 DIAGNOSIS — D631 Anemia in chronic kidney disease: Secondary | ICD-10-CM | POA: Diagnosis not present

## 2017-07-21 DIAGNOSIS — E559 Vitamin D deficiency, unspecified: Secondary | ICD-10-CM | POA: Diagnosis not present

## 2017-07-21 DIAGNOSIS — N184 Chronic kidney disease, stage 4 (severe): Secondary | ICD-10-CM | POA: Diagnosis not present

## 2017-07-22 DIAGNOSIS — Z8546 Personal history of malignant neoplasm of prostate: Secondary | ICD-10-CM | POA: Diagnosis not present

## 2017-07-22 DIAGNOSIS — Z79899 Other long term (current) drug therapy: Secondary | ICD-10-CM | POA: Diagnosis not present

## 2017-07-22 DIAGNOSIS — Z794 Long term (current) use of insulin: Secondary | ICD-10-CM | POA: Diagnosis not present

## 2017-07-22 DIAGNOSIS — K5900 Constipation, unspecified: Secondary | ICD-10-CM | POA: Diagnosis not present

## 2017-07-22 DIAGNOSIS — E119 Type 2 diabetes mellitus without complications: Secondary | ICD-10-CM | POA: Diagnosis not present

## 2017-07-22 DIAGNOSIS — M47816 Spondylosis without myelopathy or radiculopathy, lumbar region: Secondary | ICD-10-CM | POA: Diagnosis not present

## 2017-07-22 DIAGNOSIS — R531 Weakness: Secondary | ICD-10-CM | POA: Diagnosis not present

## 2017-07-22 DIAGNOSIS — Z8673 Personal history of transient ischemic attack (TIA), and cerebral infarction without residual deficits: Secondary | ICD-10-CM | POA: Diagnosis not present

## 2017-07-22 DIAGNOSIS — E039 Hypothyroidism, unspecified: Secondary | ICD-10-CM | POA: Diagnosis not present

## 2017-07-22 DIAGNOSIS — R140 Abdominal distension (gaseous): Secondary | ICD-10-CM | POA: Diagnosis not present

## 2017-07-27 DIAGNOSIS — M25552 Pain in left hip: Secondary | ICD-10-CM | POA: Diagnosis not present

## 2017-07-27 DIAGNOSIS — M545 Low back pain: Secondary | ICD-10-CM | POA: Diagnosis not present

## 2017-07-27 DIAGNOSIS — M5432 Sciatica, left side: Secondary | ICD-10-CM | POA: Diagnosis not present

## 2017-07-27 DIAGNOSIS — K5909 Other constipation: Secondary | ICD-10-CM | POA: Diagnosis not present

## 2017-08-04 DIAGNOSIS — N184 Chronic kidney disease, stage 4 (severe): Secondary | ICD-10-CM | POA: Diagnosis not present

## 2017-08-04 DIAGNOSIS — R809 Proteinuria, unspecified: Secondary | ICD-10-CM | POA: Diagnosis not present

## 2017-08-04 DIAGNOSIS — Z79899 Other long term (current) drug therapy: Secondary | ICD-10-CM | POA: Diagnosis not present

## 2017-08-04 DIAGNOSIS — E559 Vitamin D deficiency, unspecified: Secondary | ICD-10-CM | POA: Diagnosis not present

## 2017-08-04 DIAGNOSIS — D631 Anemia in chronic kidney disease: Secondary | ICD-10-CM | POA: Diagnosis not present

## 2017-08-05 DIAGNOSIS — E119 Type 2 diabetes mellitus without complications: Secondary | ICD-10-CM | POA: Diagnosis not present

## 2017-08-05 DIAGNOSIS — M159 Polyosteoarthritis, unspecified: Secondary | ICD-10-CM | POA: Diagnosis not present

## 2017-08-05 DIAGNOSIS — I639 Cerebral infarction, unspecified: Secondary | ICD-10-CM | POA: Diagnosis not present

## 2017-08-10 DIAGNOSIS — R809 Proteinuria, unspecified: Secondary | ICD-10-CM | POA: Diagnosis not present

## 2017-08-10 DIAGNOSIS — I509 Heart failure, unspecified: Secondary | ICD-10-CM | POA: Diagnosis not present

## 2017-08-10 DIAGNOSIS — D638 Anemia in other chronic diseases classified elsewhere: Secondary | ICD-10-CM | POA: Diagnosis not present

## 2017-08-10 DIAGNOSIS — E872 Acidosis: Secondary | ICD-10-CM | POA: Diagnosis not present

## 2017-08-10 DIAGNOSIS — N185 Chronic kidney disease, stage 5: Secondary | ICD-10-CM | POA: Diagnosis not present

## 2017-08-10 DIAGNOSIS — N250 Renal osteodystrophy: Secondary | ICD-10-CM | POA: Diagnosis not present

## 2017-08-15 DIAGNOSIS — I5030 Unspecified diastolic (congestive) heart failure: Secondary | ICD-10-CM | POA: Diagnosis not present

## 2017-08-15 DIAGNOSIS — Z299 Encounter for prophylactic measures, unspecified: Secondary | ICD-10-CM | POA: Diagnosis not present

## 2017-08-15 DIAGNOSIS — Z6830 Body mass index (BMI) 30.0-30.9, adult: Secondary | ICD-10-CM | POA: Diagnosis not present

## 2017-08-15 DIAGNOSIS — N184 Chronic kidney disease, stage 4 (severe): Secondary | ICD-10-CM | POA: Diagnosis not present

## 2017-08-15 DIAGNOSIS — E1122 Type 2 diabetes mellitus with diabetic chronic kidney disease: Secondary | ICD-10-CM | POA: Diagnosis not present

## 2017-08-15 DIAGNOSIS — E1165 Type 2 diabetes mellitus with hyperglycemia: Secondary | ICD-10-CM | POA: Diagnosis not present

## 2017-08-15 DIAGNOSIS — C61 Malignant neoplasm of prostate: Secondary | ICD-10-CM | POA: Diagnosis not present

## 2017-08-15 DIAGNOSIS — I723 Aneurysm of iliac artery: Secondary | ICD-10-CM | POA: Diagnosis not present

## 2017-08-18 DIAGNOSIS — D631 Anemia in chronic kidney disease: Secondary | ICD-10-CM | POA: Diagnosis not present

## 2017-08-18 DIAGNOSIS — R809 Proteinuria, unspecified: Secondary | ICD-10-CM | POA: Diagnosis not present

## 2017-08-18 DIAGNOSIS — E559 Vitamin D deficiency, unspecified: Secondary | ICD-10-CM | POA: Diagnosis not present

## 2017-08-18 DIAGNOSIS — N184 Chronic kidney disease, stage 4 (severe): Secondary | ICD-10-CM | POA: Diagnosis not present

## 2017-09-01 DIAGNOSIS — N184 Chronic kidney disease, stage 4 (severe): Secondary | ICD-10-CM | POA: Diagnosis not present

## 2017-09-01 DIAGNOSIS — R809 Proteinuria, unspecified: Secondary | ICD-10-CM | POA: Diagnosis not present

## 2017-09-01 DIAGNOSIS — E559 Vitamin D deficiency, unspecified: Secondary | ICD-10-CM | POA: Diagnosis not present

## 2017-09-01 DIAGNOSIS — D631 Anemia in chronic kidney disease: Secondary | ICD-10-CM | POA: Diagnosis not present

## 2017-09-02 DIAGNOSIS — E119 Type 2 diabetes mellitus without complications: Secondary | ICD-10-CM | POA: Diagnosis not present

## 2017-09-02 DIAGNOSIS — I639 Cerebral infarction, unspecified: Secondary | ICD-10-CM | POA: Diagnosis not present

## 2017-09-02 DIAGNOSIS — M159 Polyosteoarthritis, unspecified: Secondary | ICD-10-CM | POA: Diagnosis not present

## 2017-09-15 DIAGNOSIS — D631 Anemia in chronic kidney disease: Secondary | ICD-10-CM | POA: Diagnosis not present

## 2017-09-15 DIAGNOSIS — E559 Vitamin D deficiency, unspecified: Secondary | ICD-10-CM | POA: Diagnosis not present

## 2017-09-15 DIAGNOSIS — R809 Proteinuria, unspecified: Secondary | ICD-10-CM | POA: Diagnosis not present

## 2017-09-15 DIAGNOSIS — N184 Chronic kidney disease, stage 4 (severe): Secondary | ICD-10-CM | POA: Diagnosis not present

## 2017-09-26 DIAGNOSIS — I639 Cerebral infarction, unspecified: Secondary | ICD-10-CM | POA: Diagnosis not present

## 2017-09-26 DIAGNOSIS — M159 Polyosteoarthritis, unspecified: Secondary | ICD-10-CM | POA: Diagnosis not present

## 2017-09-26 DIAGNOSIS — E119 Type 2 diabetes mellitus without complications: Secondary | ICD-10-CM | POA: Diagnosis not present

## 2017-09-27 DIAGNOSIS — I10 Essential (primary) hypertension: Secondary | ICD-10-CM | POA: Diagnosis not present

## 2017-09-27 DIAGNOSIS — N185 Chronic kidney disease, stage 5: Secondary | ICD-10-CM | POA: Diagnosis not present

## 2017-09-27 DIAGNOSIS — R809 Proteinuria, unspecified: Secondary | ICD-10-CM | POA: Diagnosis not present

## 2017-09-27 DIAGNOSIS — I509 Heart failure, unspecified: Secondary | ICD-10-CM | POA: Diagnosis not present

## 2017-09-27 DIAGNOSIS — E1129 Type 2 diabetes mellitus with other diabetic kidney complication: Secondary | ICD-10-CM | POA: Diagnosis not present

## 2017-09-27 DIAGNOSIS — E872 Acidosis: Secondary | ICD-10-CM | POA: Diagnosis not present

## 2017-09-28 DIAGNOSIS — E1140 Type 2 diabetes mellitus with diabetic neuropathy, unspecified: Secondary | ICD-10-CM | POA: Diagnosis not present

## 2017-09-28 DIAGNOSIS — E1151 Type 2 diabetes mellitus with diabetic peripheral angiopathy without gangrene: Secondary | ICD-10-CM | POA: Diagnosis not present

## 2017-09-29 DIAGNOSIS — D631 Anemia in chronic kidney disease: Secondary | ICD-10-CM | POA: Diagnosis not present

## 2017-09-29 DIAGNOSIS — N184 Chronic kidney disease, stage 4 (severe): Secondary | ICD-10-CM | POA: Diagnosis not present

## 2017-09-29 DIAGNOSIS — R809 Proteinuria, unspecified: Secondary | ICD-10-CM | POA: Diagnosis not present

## 2017-09-29 DIAGNOSIS — E559 Vitamin D deficiency, unspecified: Secondary | ICD-10-CM | POA: Diagnosis not present

## 2017-10-13 DIAGNOSIS — E559 Vitamin D deficiency, unspecified: Secondary | ICD-10-CM | POA: Diagnosis not present

## 2017-10-13 DIAGNOSIS — R809 Proteinuria, unspecified: Secondary | ICD-10-CM | POA: Diagnosis not present

## 2017-10-13 DIAGNOSIS — D631 Anemia in chronic kidney disease: Secondary | ICD-10-CM | POA: Diagnosis not present

## 2017-10-13 DIAGNOSIS — N184 Chronic kidney disease, stage 4 (severe): Secondary | ICD-10-CM | POA: Diagnosis not present

## 2017-10-27 DIAGNOSIS — N184 Chronic kidney disease, stage 4 (severe): Secondary | ICD-10-CM | POA: Diagnosis not present

## 2017-10-27 DIAGNOSIS — E559 Vitamin D deficiency, unspecified: Secondary | ICD-10-CM | POA: Diagnosis not present

## 2017-10-27 DIAGNOSIS — R809 Proteinuria, unspecified: Secondary | ICD-10-CM | POA: Diagnosis not present

## 2017-10-27 DIAGNOSIS — D631 Anemia in chronic kidney disease: Secondary | ICD-10-CM | POA: Diagnosis not present

## 2017-11-02 DIAGNOSIS — R2689 Other abnormalities of gait and mobility: Secondary | ICD-10-CM | POA: Diagnosis not present

## 2017-11-02 DIAGNOSIS — N390 Urinary tract infection, site not specified: Secondary | ICD-10-CM | POA: Diagnosis not present

## 2017-11-02 DIAGNOSIS — K4020 Bilateral inguinal hernia, without obstruction or gangrene, not specified as recurrent: Secondary | ICD-10-CM | POA: Diagnosis not present

## 2017-11-02 DIAGNOSIS — C61 Malignant neoplasm of prostate: Secondary | ICD-10-CM | POA: Diagnosis not present

## 2017-11-02 DIAGNOSIS — Z8711 Personal history of peptic ulcer disease: Secondary | ICD-10-CM | POA: Diagnosis not present

## 2017-11-02 DIAGNOSIS — D631 Anemia in chronic kidney disease: Secondary | ICD-10-CM | POA: Diagnosis not present

## 2017-11-02 DIAGNOSIS — E1122 Type 2 diabetes mellitus with diabetic chronic kidney disease: Secondary | ICD-10-CM | POA: Diagnosis present

## 2017-11-02 DIAGNOSIS — R109 Unspecified abdominal pain: Secondary | ICD-10-CM | POA: Diagnosis not present

## 2017-11-02 DIAGNOSIS — N185 Chronic kidney disease, stage 5: Secondary | ICD-10-CM | POA: Diagnosis present

## 2017-11-02 DIAGNOSIS — Z8673 Personal history of transient ischemic attack (TIA), and cerebral infarction without residual deficits: Secondary | ICD-10-CM | POA: Diagnosis not present

## 2017-11-02 DIAGNOSIS — R1310 Dysphagia, unspecified: Secondary | ICD-10-CM | POA: Diagnosis not present

## 2017-11-02 DIAGNOSIS — R531 Weakness: Secondary | ICD-10-CM | POA: Diagnosis present

## 2017-11-02 DIAGNOSIS — D649 Anemia, unspecified: Secondary | ICD-10-CM | POA: Diagnosis present

## 2017-11-02 DIAGNOSIS — N12 Tubulo-interstitial nephritis, not specified as acute or chronic: Secondary | ICD-10-CM | POA: Diagnosis present

## 2017-11-02 DIAGNOSIS — K3189 Other diseases of stomach and duodenum: Secondary | ICD-10-CM | POA: Diagnosis not present

## 2017-11-02 DIAGNOSIS — R933 Abnormal findings on diagnostic imaging of other parts of digestive tract: Secondary | ICD-10-CM | POA: Diagnosis not present

## 2017-11-02 DIAGNOSIS — K317 Polyp of stomach and duodenum: Secondary | ICD-10-CM | POA: Diagnosis present

## 2017-11-02 DIAGNOSIS — Z8546 Personal history of malignant neoplasm of prostate: Secondary | ICD-10-CM | POA: Diagnosis not present

## 2017-11-02 DIAGNOSIS — Z794 Long term (current) use of insulin: Secondary | ICD-10-CM | POA: Diagnosis not present

## 2017-11-02 DIAGNOSIS — E039 Hypothyroidism, unspecified: Secondary | ICD-10-CM | POA: Diagnosis not present

## 2017-11-02 DIAGNOSIS — Z79899 Other long term (current) drug therapy: Secondary | ICD-10-CM | POA: Diagnosis not present

## 2017-11-02 DIAGNOSIS — M6281 Muscle weakness (generalized): Secondary | ICD-10-CM | POA: Diagnosis not present

## 2017-11-02 DIAGNOSIS — R1011 Right upper quadrant pain: Secondary | ICD-10-CM | POA: Diagnosis not present

## 2017-11-02 DIAGNOSIS — M109 Gout, unspecified: Secondary | ICD-10-CM | POA: Diagnosis not present

## 2017-11-08 DIAGNOSIS — D631 Anemia in chronic kidney disease: Secondary | ICD-10-CM | POA: Diagnosis not present

## 2017-11-08 DIAGNOSIS — E039 Hypothyroidism, unspecified: Secondary | ICD-10-CM | POA: Diagnosis not present

## 2017-11-08 DIAGNOSIS — N185 Chronic kidney disease, stage 5: Secondary | ICD-10-CM | POA: Diagnosis not present

## 2017-11-08 DIAGNOSIS — N12 Tubulo-interstitial nephritis, not specified as acute or chronic: Secondary | ICD-10-CM | POA: Diagnosis not present

## 2017-11-08 DIAGNOSIS — N119 Chronic tubulo-interstitial nephritis, unspecified: Secondary | ICD-10-CM | POA: Diagnosis not present

## 2017-11-08 DIAGNOSIS — Z79899 Other long term (current) drug therapy: Secondary | ICD-10-CM | POA: Diagnosis not present

## 2017-11-08 DIAGNOSIS — R2689 Other abnormalities of gait and mobility: Secondary | ICD-10-CM | POA: Diagnosis not present

## 2017-11-08 DIAGNOSIS — M6281 Muscle weakness (generalized): Secondary | ICD-10-CM | POA: Diagnosis not present

## 2017-11-08 DIAGNOSIS — R1030 Lower abdominal pain, unspecified: Secondary | ICD-10-CM | POA: Diagnosis not present

## 2017-11-08 DIAGNOSIS — R1310 Dysphagia, unspecified: Secondary | ICD-10-CM | POA: Diagnosis not present

## 2017-11-08 DIAGNOSIS — N10 Acute tubulo-interstitial nephritis: Secondary | ICD-10-CM | POA: Diagnosis not present

## 2017-11-08 DIAGNOSIS — N184 Chronic kidney disease, stage 4 (severe): Secondary | ICD-10-CM | POA: Diagnosis not present

## 2017-11-08 DIAGNOSIS — R809 Proteinuria, unspecified: Secondary | ICD-10-CM | POA: Diagnosis not present

## 2017-11-08 DIAGNOSIS — C61 Malignant neoplasm of prostate: Secondary | ICD-10-CM | POA: Diagnosis not present

## 2017-11-08 DIAGNOSIS — R109 Unspecified abdominal pain: Secondary | ICD-10-CM | POA: Diagnosis not present

## 2017-11-08 DIAGNOSIS — E559 Vitamin D deficiency, unspecified: Secondary | ICD-10-CM | POA: Diagnosis not present

## 2017-11-08 DIAGNOSIS — E1122 Type 2 diabetes mellitus with diabetic chronic kidney disease: Secondary | ICD-10-CM | POA: Diagnosis not present

## 2017-11-08 DIAGNOSIS — M109 Gout, unspecified: Secondary | ICD-10-CM | POA: Diagnosis not present

## 2017-11-08 DIAGNOSIS — Z8673 Personal history of transient ischemic attack (TIA), and cerebral infarction without residual deficits: Secondary | ICD-10-CM | POA: Diagnosis not present

## 2017-11-08 DIAGNOSIS — K317 Polyp of stomach and duodenum: Secondary | ICD-10-CM | POA: Diagnosis not present

## 2017-11-09 DIAGNOSIS — N10 Acute tubulo-interstitial nephritis: Secondary | ICD-10-CM | POA: Diagnosis not present

## 2017-11-09 DIAGNOSIS — R109 Unspecified abdominal pain: Secondary | ICD-10-CM | POA: Diagnosis not present

## 2017-11-09 DIAGNOSIS — K317 Polyp of stomach and duodenum: Secondary | ICD-10-CM | POA: Diagnosis not present

## 2017-11-09 DIAGNOSIS — C61 Malignant neoplasm of prostate: Secondary | ICD-10-CM | POA: Diagnosis not present

## 2017-11-10 DIAGNOSIS — N184 Chronic kidney disease, stage 4 (severe): Secondary | ICD-10-CM | POA: Diagnosis not present

## 2017-11-10 DIAGNOSIS — E559 Vitamin D deficiency, unspecified: Secondary | ICD-10-CM | POA: Diagnosis not present

## 2017-11-10 DIAGNOSIS — D631 Anemia in chronic kidney disease: Secondary | ICD-10-CM | POA: Diagnosis not present

## 2017-11-10 DIAGNOSIS — Z79899 Other long term (current) drug therapy: Secondary | ICD-10-CM | POA: Diagnosis not present

## 2017-11-10 DIAGNOSIS — R809 Proteinuria, unspecified: Secondary | ICD-10-CM | POA: Diagnosis not present

## 2017-11-16 DIAGNOSIS — R1030 Lower abdominal pain, unspecified: Secondary | ICD-10-CM | POA: Diagnosis not present

## 2017-11-16 DIAGNOSIS — R109 Unspecified abdominal pain: Secondary | ICD-10-CM | POA: Diagnosis not present

## 2017-11-16 DIAGNOSIS — N10 Acute tubulo-interstitial nephritis: Secondary | ICD-10-CM | POA: Diagnosis not present

## 2017-11-16 DIAGNOSIS — C61 Malignant neoplasm of prostate: Secondary | ICD-10-CM | POA: Diagnosis not present

## 2017-11-24 DIAGNOSIS — Z79899 Other long term (current) drug therapy: Secondary | ICD-10-CM | POA: Diagnosis not present

## 2017-11-24 DIAGNOSIS — D631 Anemia in chronic kidney disease: Secondary | ICD-10-CM | POA: Diagnosis not present

## 2017-11-24 DIAGNOSIS — E559 Vitamin D deficiency, unspecified: Secondary | ICD-10-CM | POA: Diagnosis not present

## 2017-11-24 DIAGNOSIS — R809 Proteinuria, unspecified: Secondary | ICD-10-CM | POA: Diagnosis not present

## 2017-11-24 DIAGNOSIS — N184 Chronic kidney disease, stage 4 (severe): Secondary | ICD-10-CM | POA: Diagnosis not present

## 2017-12-04 DIAGNOSIS — C61 Malignant neoplasm of prostate: Secondary | ICD-10-CM | POA: Diagnosis not present

## 2017-12-04 DIAGNOSIS — K317 Polyp of stomach and duodenum: Secondary | ICD-10-CM | POA: Diagnosis not present

## 2017-12-04 DIAGNOSIS — R1030 Lower abdominal pain, unspecified: Secondary | ICD-10-CM | POA: Diagnosis not present

## 2017-12-04 DIAGNOSIS — N119 Chronic tubulo-interstitial nephritis, unspecified: Secondary | ICD-10-CM | POA: Diagnosis not present

## 2017-12-06 DIAGNOSIS — N10 Acute tubulo-interstitial nephritis: Secondary | ICD-10-CM | POA: Diagnosis not present

## 2017-12-06 DIAGNOSIS — Z8673 Personal history of transient ischemic attack (TIA), and cerebral infarction without residual deficits: Secondary | ICD-10-CM | POA: Diagnosis not present

## 2017-12-06 DIAGNOSIS — M109 Gout, unspecified: Secondary | ICD-10-CM | POA: Diagnosis not present

## 2017-12-06 DIAGNOSIS — E1122 Type 2 diabetes mellitus with diabetic chronic kidney disease: Secondary | ICD-10-CM | POA: Diagnosis not present

## 2017-12-06 DIAGNOSIS — Z794 Long term (current) use of insulin: Secondary | ICD-10-CM | POA: Diagnosis not present

## 2017-12-06 DIAGNOSIS — I120 Hypertensive chronic kidney disease with stage 5 chronic kidney disease or end stage renal disease: Secondary | ICD-10-CM | POA: Diagnosis not present

## 2017-12-06 DIAGNOSIS — Z8546 Personal history of malignant neoplasm of prostate: Secondary | ICD-10-CM | POA: Diagnosis not present

## 2017-12-06 DIAGNOSIS — E039 Hypothyroidism, unspecified: Secondary | ICD-10-CM | POA: Diagnosis not present

## 2017-12-06 DIAGNOSIS — D631 Anemia in chronic kidney disease: Secondary | ICD-10-CM | POA: Diagnosis not present

## 2017-12-06 DIAGNOSIS — N185 Chronic kidney disease, stage 5: Secondary | ICD-10-CM | POA: Diagnosis not present

## 2017-12-07 DIAGNOSIS — E1140 Type 2 diabetes mellitus with diabetic neuropathy, unspecified: Secondary | ICD-10-CM | POA: Diagnosis not present

## 2017-12-07 DIAGNOSIS — E039 Hypothyroidism, unspecified: Secondary | ICD-10-CM | POA: Diagnosis not present

## 2017-12-07 DIAGNOSIS — N10 Acute tubulo-interstitial nephritis: Secondary | ICD-10-CM | POA: Diagnosis not present

## 2017-12-07 DIAGNOSIS — D631 Anemia in chronic kidney disease: Secondary | ICD-10-CM | POA: Diagnosis not present

## 2017-12-07 DIAGNOSIS — E1122 Type 2 diabetes mellitus with diabetic chronic kidney disease: Secondary | ICD-10-CM | POA: Diagnosis not present

## 2017-12-07 DIAGNOSIS — N185 Chronic kidney disease, stage 5: Secondary | ICD-10-CM | POA: Diagnosis not present

## 2017-12-07 DIAGNOSIS — I120 Hypertensive chronic kidney disease with stage 5 chronic kidney disease or end stage renal disease: Secondary | ICD-10-CM | POA: Diagnosis not present

## 2017-12-07 DIAGNOSIS — E1151 Type 2 diabetes mellitus with diabetic peripheral angiopathy without gangrene: Secondary | ICD-10-CM | POA: Diagnosis not present

## 2017-12-08 DIAGNOSIS — I723 Aneurysm of iliac artery: Secondary | ICD-10-CM | POA: Diagnosis not present

## 2017-12-08 DIAGNOSIS — I5030 Unspecified diastolic (congestive) heart failure: Secondary | ICD-10-CM | POA: Diagnosis not present

## 2017-12-08 DIAGNOSIS — C61 Malignant neoplasm of prostate: Secondary | ICD-10-CM | POA: Diagnosis not present

## 2017-12-08 DIAGNOSIS — E559 Vitamin D deficiency, unspecified: Secondary | ICD-10-CM | POA: Diagnosis not present

## 2017-12-08 DIAGNOSIS — E1122 Type 2 diabetes mellitus with diabetic chronic kidney disease: Secondary | ICD-10-CM | POA: Diagnosis not present

## 2017-12-08 DIAGNOSIS — R809 Proteinuria, unspecified: Secondary | ICD-10-CM | POA: Diagnosis not present

## 2017-12-08 DIAGNOSIS — K219 Gastro-esophageal reflux disease without esophagitis: Secondary | ICD-10-CM | POA: Diagnosis not present

## 2017-12-08 DIAGNOSIS — Z6830 Body mass index (BMI) 30.0-30.9, adult: Secondary | ICD-10-CM | POA: Diagnosis not present

## 2017-12-08 DIAGNOSIS — M109 Gout, unspecified: Secondary | ICD-10-CM | POA: Diagnosis not present

## 2017-12-08 DIAGNOSIS — Z299 Encounter for prophylactic measures, unspecified: Secondary | ICD-10-CM | POA: Diagnosis not present

## 2017-12-08 DIAGNOSIS — D631 Anemia in chronic kidney disease: Secondary | ICD-10-CM | POA: Diagnosis not present

## 2017-12-08 DIAGNOSIS — E1165 Type 2 diabetes mellitus with hyperglycemia: Secondary | ICD-10-CM | POA: Diagnosis not present

## 2017-12-08 DIAGNOSIS — Z79899 Other long term (current) drug therapy: Secondary | ICD-10-CM | POA: Diagnosis not present

## 2017-12-08 DIAGNOSIS — N184 Chronic kidney disease, stage 4 (severe): Secondary | ICD-10-CM | POA: Diagnosis not present

## 2017-12-12 DIAGNOSIS — N185 Chronic kidney disease, stage 5: Secondary | ICD-10-CM | POA: Diagnosis not present

## 2017-12-12 DIAGNOSIS — E039 Hypothyroidism, unspecified: Secondary | ICD-10-CM | POA: Diagnosis not present

## 2017-12-12 DIAGNOSIS — N10 Acute tubulo-interstitial nephritis: Secondary | ICD-10-CM | POA: Diagnosis not present

## 2017-12-12 DIAGNOSIS — E1122 Type 2 diabetes mellitus with diabetic chronic kidney disease: Secondary | ICD-10-CM | POA: Diagnosis not present

## 2017-12-12 DIAGNOSIS — D631 Anemia in chronic kidney disease: Secondary | ICD-10-CM | POA: Diagnosis not present

## 2017-12-12 DIAGNOSIS — I120 Hypertensive chronic kidney disease with stage 5 chronic kidney disease or end stage renal disease: Secondary | ICD-10-CM | POA: Diagnosis not present

## 2017-12-13 DIAGNOSIS — I10 Essential (primary) hypertension: Secondary | ICD-10-CM | POA: Diagnosis not present

## 2017-12-13 DIAGNOSIS — D638 Anemia in other chronic diseases classified elsewhere: Secondary | ICD-10-CM | POA: Diagnosis not present

## 2017-12-13 DIAGNOSIS — E1129 Type 2 diabetes mellitus with other diabetic kidney complication: Secondary | ICD-10-CM | POA: Diagnosis not present

## 2017-12-13 DIAGNOSIS — N185 Chronic kidney disease, stage 5: Secondary | ICD-10-CM | POA: Diagnosis not present

## 2017-12-14 DIAGNOSIS — N10 Acute tubulo-interstitial nephritis: Secondary | ICD-10-CM | POA: Diagnosis not present

## 2017-12-14 DIAGNOSIS — E039 Hypothyroidism, unspecified: Secondary | ICD-10-CM | POA: Diagnosis not present

## 2017-12-14 DIAGNOSIS — I120 Hypertensive chronic kidney disease with stage 5 chronic kidney disease or end stage renal disease: Secondary | ICD-10-CM | POA: Diagnosis not present

## 2017-12-14 DIAGNOSIS — E1122 Type 2 diabetes mellitus with diabetic chronic kidney disease: Secondary | ICD-10-CM | POA: Diagnosis not present

## 2017-12-14 DIAGNOSIS — N185 Chronic kidney disease, stage 5: Secondary | ICD-10-CM | POA: Diagnosis not present

## 2017-12-14 DIAGNOSIS — D631 Anemia in chronic kidney disease: Secondary | ICD-10-CM | POA: Diagnosis not present

## 2017-12-15 DIAGNOSIS — E039 Hypothyroidism, unspecified: Secondary | ICD-10-CM | POA: Diagnosis not present

## 2017-12-15 DIAGNOSIS — D631 Anemia in chronic kidney disease: Secondary | ICD-10-CM | POA: Diagnosis not present

## 2017-12-15 DIAGNOSIS — E1122 Type 2 diabetes mellitus with diabetic chronic kidney disease: Secondary | ICD-10-CM | POA: Diagnosis not present

## 2017-12-15 DIAGNOSIS — I120 Hypertensive chronic kidney disease with stage 5 chronic kidney disease or end stage renal disease: Secondary | ICD-10-CM | POA: Diagnosis not present

## 2017-12-15 DIAGNOSIS — N10 Acute tubulo-interstitial nephritis: Secondary | ICD-10-CM | POA: Diagnosis not present

## 2017-12-15 DIAGNOSIS — N185 Chronic kidney disease, stage 5: Secondary | ICD-10-CM | POA: Diagnosis not present

## 2017-12-19 DIAGNOSIS — I120 Hypertensive chronic kidney disease with stage 5 chronic kidney disease or end stage renal disease: Secondary | ICD-10-CM | POA: Diagnosis not present

## 2017-12-19 DIAGNOSIS — D631 Anemia in chronic kidney disease: Secondary | ICD-10-CM | POA: Diagnosis not present

## 2017-12-19 DIAGNOSIS — E1122 Type 2 diabetes mellitus with diabetic chronic kidney disease: Secondary | ICD-10-CM | POA: Diagnosis not present

## 2017-12-19 DIAGNOSIS — N185 Chronic kidney disease, stage 5: Secondary | ICD-10-CM | POA: Diagnosis not present

## 2017-12-19 DIAGNOSIS — E039 Hypothyroidism, unspecified: Secondary | ICD-10-CM | POA: Diagnosis not present

## 2017-12-19 DIAGNOSIS — N10 Acute tubulo-interstitial nephritis: Secondary | ICD-10-CM | POA: Diagnosis not present

## 2017-12-20 DIAGNOSIS — I120 Hypertensive chronic kidney disease with stage 5 chronic kidney disease or end stage renal disease: Secondary | ICD-10-CM | POA: Diagnosis not present

## 2017-12-20 DIAGNOSIS — N10 Acute tubulo-interstitial nephritis: Secondary | ICD-10-CM | POA: Diagnosis not present

## 2017-12-20 DIAGNOSIS — D631 Anemia in chronic kidney disease: Secondary | ICD-10-CM | POA: Diagnosis not present

## 2017-12-20 DIAGNOSIS — E1122 Type 2 diabetes mellitus with diabetic chronic kidney disease: Secondary | ICD-10-CM | POA: Diagnosis not present

## 2017-12-20 DIAGNOSIS — E039 Hypothyroidism, unspecified: Secondary | ICD-10-CM | POA: Diagnosis not present

## 2017-12-20 DIAGNOSIS — N185 Chronic kidney disease, stage 5: Secondary | ICD-10-CM | POA: Diagnosis not present

## 2017-12-21 DIAGNOSIS — N185 Chronic kidney disease, stage 5: Secondary | ICD-10-CM | POA: Diagnosis not present

## 2017-12-21 DIAGNOSIS — E1122 Type 2 diabetes mellitus with diabetic chronic kidney disease: Secondary | ICD-10-CM | POA: Diagnosis not present

## 2017-12-21 DIAGNOSIS — E039 Hypothyroidism, unspecified: Secondary | ICD-10-CM | POA: Diagnosis not present

## 2017-12-21 DIAGNOSIS — N10 Acute tubulo-interstitial nephritis: Secondary | ICD-10-CM | POA: Diagnosis not present

## 2017-12-21 DIAGNOSIS — I120 Hypertensive chronic kidney disease with stage 5 chronic kidney disease or end stage renal disease: Secondary | ICD-10-CM | POA: Diagnosis not present

## 2017-12-21 DIAGNOSIS — D631 Anemia in chronic kidney disease: Secondary | ICD-10-CM | POA: Diagnosis not present

## 2017-12-22 DIAGNOSIS — I120 Hypertensive chronic kidney disease with stage 5 chronic kidney disease or end stage renal disease: Secondary | ICD-10-CM | POA: Diagnosis not present

## 2017-12-22 DIAGNOSIS — D509 Iron deficiency anemia, unspecified: Secondary | ICD-10-CM | POA: Diagnosis not present

## 2017-12-22 DIAGNOSIS — N185 Chronic kidney disease, stage 5: Secondary | ICD-10-CM | POA: Diagnosis not present

## 2017-12-22 DIAGNOSIS — N10 Acute tubulo-interstitial nephritis: Secondary | ICD-10-CM | POA: Diagnosis not present

## 2017-12-22 DIAGNOSIS — N184 Chronic kidney disease, stage 4 (severe): Secondary | ICD-10-CM | POA: Diagnosis not present

## 2017-12-22 DIAGNOSIS — E039 Hypothyroidism, unspecified: Secondary | ICD-10-CM | POA: Diagnosis not present

## 2017-12-22 DIAGNOSIS — E1122 Type 2 diabetes mellitus with diabetic chronic kidney disease: Secondary | ICD-10-CM | POA: Diagnosis not present

## 2017-12-22 DIAGNOSIS — D631 Anemia in chronic kidney disease: Secondary | ICD-10-CM | POA: Diagnosis not present

## 2017-12-26 DIAGNOSIS — K319 Disease of stomach and duodenum, unspecified: Secondary | ICD-10-CM | POA: Diagnosis not present

## 2017-12-27 DIAGNOSIS — E1122 Type 2 diabetes mellitus with diabetic chronic kidney disease: Secondary | ICD-10-CM | POA: Diagnosis not present

## 2017-12-27 DIAGNOSIS — N185 Chronic kidney disease, stage 5: Secondary | ICD-10-CM | POA: Diagnosis not present

## 2017-12-27 DIAGNOSIS — D631 Anemia in chronic kidney disease: Secondary | ICD-10-CM | POA: Diagnosis not present

## 2017-12-27 DIAGNOSIS — N10 Acute tubulo-interstitial nephritis: Secondary | ICD-10-CM | POA: Diagnosis not present

## 2017-12-27 DIAGNOSIS — E039 Hypothyroidism, unspecified: Secondary | ICD-10-CM | POA: Diagnosis not present

## 2017-12-27 DIAGNOSIS — I120 Hypertensive chronic kidney disease with stage 5 chronic kidney disease or end stage renal disease: Secondary | ICD-10-CM | POA: Diagnosis not present

## 2017-12-28 DIAGNOSIS — I120 Hypertensive chronic kidney disease with stage 5 chronic kidney disease or end stage renal disease: Secondary | ICD-10-CM | POA: Diagnosis not present

## 2017-12-28 DIAGNOSIS — E039 Hypothyroidism, unspecified: Secondary | ICD-10-CM | POA: Diagnosis not present

## 2017-12-28 DIAGNOSIS — N185 Chronic kidney disease, stage 5: Secondary | ICD-10-CM | POA: Diagnosis not present

## 2017-12-28 DIAGNOSIS — N10 Acute tubulo-interstitial nephritis: Secondary | ICD-10-CM | POA: Diagnosis not present

## 2017-12-28 DIAGNOSIS — D631 Anemia in chronic kidney disease: Secondary | ICD-10-CM | POA: Diagnosis not present

## 2017-12-28 DIAGNOSIS — E1122 Type 2 diabetes mellitus with diabetic chronic kidney disease: Secondary | ICD-10-CM | POA: Diagnosis not present

## 2017-12-29 DIAGNOSIS — D631 Anemia in chronic kidney disease: Secondary | ICD-10-CM | POA: Diagnosis not present

## 2017-12-29 DIAGNOSIS — N185 Chronic kidney disease, stage 5: Secondary | ICD-10-CM | POA: Diagnosis not present

## 2017-12-29 DIAGNOSIS — N10 Acute tubulo-interstitial nephritis: Secondary | ICD-10-CM | POA: Diagnosis not present

## 2017-12-29 DIAGNOSIS — E039 Hypothyroidism, unspecified: Secondary | ICD-10-CM | POA: Diagnosis not present

## 2017-12-29 DIAGNOSIS — E1122 Type 2 diabetes mellitus with diabetic chronic kidney disease: Secondary | ICD-10-CM | POA: Diagnosis not present

## 2017-12-29 DIAGNOSIS — I120 Hypertensive chronic kidney disease with stage 5 chronic kidney disease or end stage renal disease: Secondary | ICD-10-CM | POA: Diagnosis not present

## 2017-12-30 DIAGNOSIS — N10 Acute tubulo-interstitial nephritis: Secondary | ICD-10-CM | POA: Diagnosis not present

## 2017-12-30 DIAGNOSIS — E039 Hypothyroidism, unspecified: Secondary | ICD-10-CM | POA: Diagnosis not present

## 2017-12-30 DIAGNOSIS — E1122 Type 2 diabetes mellitus with diabetic chronic kidney disease: Secondary | ICD-10-CM | POA: Diagnosis not present

## 2017-12-30 DIAGNOSIS — N185 Chronic kidney disease, stage 5: Secondary | ICD-10-CM | POA: Diagnosis not present

## 2017-12-30 DIAGNOSIS — I120 Hypertensive chronic kidney disease with stage 5 chronic kidney disease or end stage renal disease: Secondary | ICD-10-CM | POA: Diagnosis not present

## 2017-12-30 DIAGNOSIS — D631 Anemia in chronic kidney disease: Secondary | ICD-10-CM | POA: Diagnosis not present

## 2018-01-02 DIAGNOSIS — E039 Hypothyroidism, unspecified: Secondary | ICD-10-CM | POA: Diagnosis not present

## 2018-01-02 DIAGNOSIS — N185 Chronic kidney disease, stage 5: Secondary | ICD-10-CM | POA: Diagnosis not present

## 2018-01-02 DIAGNOSIS — E1122 Type 2 diabetes mellitus with diabetic chronic kidney disease: Secondary | ICD-10-CM | POA: Diagnosis not present

## 2018-01-02 DIAGNOSIS — D631 Anemia in chronic kidney disease: Secondary | ICD-10-CM | POA: Diagnosis not present

## 2018-01-02 DIAGNOSIS — I120 Hypertensive chronic kidney disease with stage 5 chronic kidney disease or end stage renal disease: Secondary | ICD-10-CM | POA: Diagnosis not present

## 2018-01-02 DIAGNOSIS — N10 Acute tubulo-interstitial nephritis: Secondary | ICD-10-CM | POA: Diagnosis not present

## 2018-01-03 DIAGNOSIS — D631 Anemia in chronic kidney disease: Secondary | ICD-10-CM | POA: Diagnosis not present

## 2018-01-03 DIAGNOSIS — E039 Hypothyroidism, unspecified: Secondary | ICD-10-CM | POA: Diagnosis not present

## 2018-01-03 DIAGNOSIS — I120 Hypertensive chronic kidney disease with stage 5 chronic kidney disease or end stage renal disease: Secondary | ICD-10-CM | POA: Diagnosis not present

## 2018-01-03 DIAGNOSIS — E1122 Type 2 diabetes mellitus with diabetic chronic kidney disease: Secondary | ICD-10-CM | POA: Diagnosis not present

## 2018-01-03 DIAGNOSIS — N10 Acute tubulo-interstitial nephritis: Secondary | ICD-10-CM | POA: Diagnosis not present

## 2018-01-03 DIAGNOSIS — N185 Chronic kidney disease, stage 5: Secondary | ICD-10-CM | POA: Diagnosis not present

## 2018-01-05 DIAGNOSIS — D631 Anemia in chronic kidney disease: Secondary | ICD-10-CM | POA: Diagnosis not present

## 2018-01-05 DIAGNOSIS — N189 Chronic kidney disease, unspecified: Secondary | ICD-10-CM | POA: Diagnosis not present

## 2018-01-06 DIAGNOSIS — E1122 Type 2 diabetes mellitus with diabetic chronic kidney disease: Secondary | ICD-10-CM | POA: Diagnosis not present

## 2018-01-06 DIAGNOSIS — E039 Hypothyroidism, unspecified: Secondary | ICD-10-CM | POA: Diagnosis not present

## 2018-01-06 DIAGNOSIS — D631 Anemia in chronic kidney disease: Secondary | ICD-10-CM | POA: Diagnosis not present

## 2018-01-06 DIAGNOSIS — N10 Acute tubulo-interstitial nephritis: Secondary | ICD-10-CM | POA: Diagnosis not present

## 2018-01-06 DIAGNOSIS — I120 Hypertensive chronic kidney disease with stage 5 chronic kidney disease or end stage renal disease: Secondary | ICD-10-CM | POA: Diagnosis not present

## 2018-01-06 DIAGNOSIS — N185 Chronic kidney disease, stage 5: Secondary | ICD-10-CM | POA: Diagnosis not present

## 2018-01-10 DIAGNOSIS — N10 Acute tubulo-interstitial nephritis: Secondary | ICD-10-CM | POA: Diagnosis not present

## 2018-01-10 DIAGNOSIS — E039 Hypothyroidism, unspecified: Secondary | ICD-10-CM | POA: Diagnosis not present

## 2018-01-10 DIAGNOSIS — D631 Anemia in chronic kidney disease: Secondary | ICD-10-CM | POA: Diagnosis not present

## 2018-01-10 DIAGNOSIS — I120 Hypertensive chronic kidney disease with stage 5 chronic kidney disease or end stage renal disease: Secondary | ICD-10-CM | POA: Diagnosis not present

## 2018-01-10 DIAGNOSIS — N185 Chronic kidney disease, stage 5: Secondary | ICD-10-CM | POA: Diagnosis not present

## 2018-01-10 DIAGNOSIS — E1122 Type 2 diabetes mellitus with diabetic chronic kidney disease: Secondary | ICD-10-CM | POA: Diagnosis not present

## 2018-01-11 DIAGNOSIS — N185 Chronic kidney disease, stage 5: Secondary | ICD-10-CM | POA: Diagnosis not present

## 2018-01-11 DIAGNOSIS — E039 Hypothyroidism, unspecified: Secondary | ICD-10-CM | POA: Diagnosis not present

## 2018-01-11 DIAGNOSIS — I120 Hypertensive chronic kidney disease with stage 5 chronic kidney disease or end stage renal disease: Secondary | ICD-10-CM | POA: Diagnosis not present

## 2018-01-11 DIAGNOSIS — E1122 Type 2 diabetes mellitus with diabetic chronic kidney disease: Secondary | ICD-10-CM | POA: Diagnosis not present

## 2018-01-11 DIAGNOSIS — D631 Anemia in chronic kidney disease: Secondary | ICD-10-CM | POA: Diagnosis not present

## 2018-01-11 DIAGNOSIS — N10 Acute tubulo-interstitial nephritis: Secondary | ICD-10-CM | POA: Diagnosis not present

## 2018-01-13 DIAGNOSIS — E039 Hypothyroidism, unspecified: Secondary | ICD-10-CM | POA: Diagnosis not present

## 2018-01-13 DIAGNOSIS — I120 Hypertensive chronic kidney disease with stage 5 chronic kidney disease or end stage renal disease: Secondary | ICD-10-CM | POA: Diagnosis not present

## 2018-01-13 DIAGNOSIS — E1122 Type 2 diabetes mellitus with diabetic chronic kidney disease: Secondary | ICD-10-CM | POA: Diagnosis not present

## 2018-01-13 DIAGNOSIS — N185 Chronic kidney disease, stage 5: Secondary | ICD-10-CM | POA: Diagnosis not present

## 2018-01-13 DIAGNOSIS — N10 Acute tubulo-interstitial nephritis: Secondary | ICD-10-CM | POA: Diagnosis not present

## 2018-01-13 DIAGNOSIS — D631 Anemia in chronic kidney disease: Secondary | ICD-10-CM | POA: Diagnosis not present

## 2018-01-16 DIAGNOSIS — E039 Hypothyroidism, unspecified: Secondary | ICD-10-CM | POA: Diagnosis not present

## 2018-01-16 DIAGNOSIS — D631 Anemia in chronic kidney disease: Secondary | ICD-10-CM | POA: Diagnosis not present

## 2018-01-16 DIAGNOSIS — E1122 Type 2 diabetes mellitus with diabetic chronic kidney disease: Secondary | ICD-10-CM | POA: Diagnosis not present

## 2018-01-16 DIAGNOSIS — I120 Hypertensive chronic kidney disease with stage 5 chronic kidney disease or end stage renal disease: Secondary | ICD-10-CM | POA: Diagnosis not present

## 2018-01-16 DIAGNOSIS — N10 Acute tubulo-interstitial nephritis: Secondary | ICD-10-CM | POA: Diagnosis not present

## 2018-01-16 DIAGNOSIS — N185 Chronic kidney disease, stage 5: Secondary | ICD-10-CM | POA: Diagnosis not present

## 2018-01-17 DIAGNOSIS — E1122 Type 2 diabetes mellitus with diabetic chronic kidney disease: Secondary | ICD-10-CM | POA: Diagnosis not present

## 2018-01-17 DIAGNOSIS — I120 Hypertensive chronic kidney disease with stage 5 chronic kidney disease or end stage renal disease: Secondary | ICD-10-CM | POA: Diagnosis not present

## 2018-01-17 DIAGNOSIS — N10 Acute tubulo-interstitial nephritis: Secondary | ICD-10-CM | POA: Diagnosis not present

## 2018-01-17 DIAGNOSIS — N185 Chronic kidney disease, stage 5: Secondary | ICD-10-CM | POA: Diagnosis not present

## 2018-01-17 DIAGNOSIS — E039 Hypothyroidism, unspecified: Secondary | ICD-10-CM | POA: Diagnosis not present

## 2018-01-17 DIAGNOSIS — D631 Anemia in chronic kidney disease: Secondary | ICD-10-CM | POA: Diagnosis not present

## 2018-01-19 DIAGNOSIS — N184 Chronic kidney disease, stage 4 (severe): Secondary | ICD-10-CM | POA: Diagnosis not present

## 2018-01-19 DIAGNOSIS — N10 Acute tubulo-interstitial nephritis: Secondary | ICD-10-CM | POA: Diagnosis not present

## 2018-01-19 DIAGNOSIS — E039 Hypothyroidism, unspecified: Secondary | ICD-10-CM | POA: Diagnosis not present

## 2018-01-19 DIAGNOSIS — E1122 Type 2 diabetes mellitus with diabetic chronic kidney disease: Secondary | ICD-10-CM | POA: Diagnosis not present

## 2018-01-19 DIAGNOSIS — I120 Hypertensive chronic kidney disease with stage 5 chronic kidney disease or end stage renal disease: Secondary | ICD-10-CM | POA: Diagnosis not present

## 2018-01-19 DIAGNOSIS — Z79899 Other long term (current) drug therapy: Secondary | ICD-10-CM | POA: Diagnosis not present

## 2018-01-19 DIAGNOSIS — R809 Proteinuria, unspecified: Secondary | ICD-10-CM | POA: Diagnosis not present

## 2018-01-19 DIAGNOSIS — D631 Anemia in chronic kidney disease: Secondary | ICD-10-CM | POA: Diagnosis not present

## 2018-01-19 DIAGNOSIS — D509 Iron deficiency anemia, unspecified: Secondary | ICD-10-CM | POA: Diagnosis not present

## 2018-01-19 DIAGNOSIS — N185 Chronic kidney disease, stage 5: Secondary | ICD-10-CM | POA: Diagnosis not present

## 2018-01-19 DIAGNOSIS — E559 Vitamin D deficiency, unspecified: Secondary | ICD-10-CM | POA: Diagnosis not present

## 2018-01-20 DIAGNOSIS — N10 Acute tubulo-interstitial nephritis: Secondary | ICD-10-CM | POA: Diagnosis not present

## 2018-01-20 DIAGNOSIS — E1122 Type 2 diabetes mellitus with diabetic chronic kidney disease: Secondary | ICD-10-CM | POA: Diagnosis not present

## 2018-01-20 DIAGNOSIS — I120 Hypertensive chronic kidney disease with stage 5 chronic kidney disease or end stage renal disease: Secondary | ICD-10-CM | POA: Diagnosis not present

## 2018-01-20 DIAGNOSIS — D631 Anemia in chronic kidney disease: Secondary | ICD-10-CM | POA: Diagnosis not present

## 2018-01-20 DIAGNOSIS — E039 Hypothyroidism, unspecified: Secondary | ICD-10-CM | POA: Diagnosis not present

## 2018-01-20 DIAGNOSIS — N185 Chronic kidney disease, stage 5: Secondary | ICD-10-CM | POA: Diagnosis not present

## 2018-01-23 DIAGNOSIS — I120 Hypertensive chronic kidney disease with stage 5 chronic kidney disease or end stage renal disease: Secondary | ICD-10-CM | POA: Diagnosis not present

## 2018-01-23 DIAGNOSIS — E039 Hypothyroidism, unspecified: Secondary | ICD-10-CM | POA: Diagnosis not present

## 2018-01-23 DIAGNOSIS — D631 Anemia in chronic kidney disease: Secondary | ICD-10-CM | POA: Diagnosis not present

## 2018-01-23 DIAGNOSIS — N10 Acute tubulo-interstitial nephritis: Secondary | ICD-10-CM | POA: Diagnosis not present

## 2018-01-23 DIAGNOSIS — E1122 Type 2 diabetes mellitus with diabetic chronic kidney disease: Secondary | ICD-10-CM | POA: Diagnosis not present

## 2018-01-23 DIAGNOSIS — N185 Chronic kidney disease, stage 5: Secondary | ICD-10-CM | POA: Diagnosis not present

## 2018-01-24 DIAGNOSIS — I120 Hypertensive chronic kidney disease with stage 5 chronic kidney disease or end stage renal disease: Secondary | ICD-10-CM | POA: Diagnosis not present

## 2018-01-24 DIAGNOSIS — E1122 Type 2 diabetes mellitus with diabetic chronic kidney disease: Secondary | ICD-10-CM | POA: Diagnosis not present

## 2018-01-24 DIAGNOSIS — N10 Acute tubulo-interstitial nephritis: Secondary | ICD-10-CM | POA: Diagnosis not present

## 2018-01-24 DIAGNOSIS — E039 Hypothyroidism, unspecified: Secondary | ICD-10-CM | POA: Diagnosis not present

## 2018-01-24 DIAGNOSIS — D631 Anemia in chronic kidney disease: Secondary | ICD-10-CM | POA: Diagnosis not present

## 2018-01-24 DIAGNOSIS — N185 Chronic kidney disease, stage 5: Secondary | ICD-10-CM | POA: Diagnosis not present

## 2018-01-26 DIAGNOSIS — I120 Hypertensive chronic kidney disease with stage 5 chronic kidney disease or end stage renal disease: Secondary | ICD-10-CM | POA: Diagnosis not present

## 2018-01-26 DIAGNOSIS — N185 Chronic kidney disease, stage 5: Secondary | ICD-10-CM | POA: Diagnosis not present

## 2018-01-26 DIAGNOSIS — D631 Anemia in chronic kidney disease: Secondary | ICD-10-CM | POA: Diagnosis not present

## 2018-01-26 DIAGNOSIS — N10 Acute tubulo-interstitial nephritis: Secondary | ICD-10-CM | POA: Diagnosis not present

## 2018-01-26 DIAGNOSIS — E1122 Type 2 diabetes mellitus with diabetic chronic kidney disease: Secondary | ICD-10-CM | POA: Diagnosis not present

## 2018-01-26 DIAGNOSIS — E039 Hypothyroidism, unspecified: Secondary | ICD-10-CM | POA: Diagnosis not present

## 2018-01-27 DIAGNOSIS — N185 Chronic kidney disease, stage 5: Secondary | ICD-10-CM | POA: Diagnosis not present

## 2018-01-27 DIAGNOSIS — I120 Hypertensive chronic kidney disease with stage 5 chronic kidney disease or end stage renal disease: Secondary | ICD-10-CM | POA: Diagnosis not present

## 2018-01-27 DIAGNOSIS — D631 Anemia in chronic kidney disease: Secondary | ICD-10-CM | POA: Diagnosis not present

## 2018-01-27 DIAGNOSIS — N10 Acute tubulo-interstitial nephritis: Secondary | ICD-10-CM | POA: Diagnosis not present

## 2018-01-27 DIAGNOSIS — E1122 Type 2 diabetes mellitus with diabetic chronic kidney disease: Secondary | ICD-10-CM | POA: Diagnosis not present

## 2018-01-27 DIAGNOSIS — E039 Hypothyroidism, unspecified: Secondary | ICD-10-CM | POA: Diagnosis not present

## 2018-01-30 DIAGNOSIS — I120 Hypertensive chronic kidney disease with stage 5 chronic kidney disease or end stage renal disease: Secondary | ICD-10-CM | POA: Diagnosis not present

## 2018-01-30 DIAGNOSIS — N185 Chronic kidney disease, stage 5: Secondary | ICD-10-CM | POA: Diagnosis not present

## 2018-01-30 DIAGNOSIS — E1122 Type 2 diabetes mellitus with diabetic chronic kidney disease: Secondary | ICD-10-CM | POA: Diagnosis not present

## 2018-01-30 DIAGNOSIS — E039 Hypothyroidism, unspecified: Secondary | ICD-10-CM | POA: Diagnosis not present

## 2018-01-30 DIAGNOSIS — N10 Acute tubulo-interstitial nephritis: Secondary | ICD-10-CM | POA: Diagnosis not present

## 2018-01-30 DIAGNOSIS — D631 Anemia in chronic kidney disease: Secondary | ICD-10-CM | POA: Diagnosis not present

## 2018-01-31 DIAGNOSIS — D631 Anemia in chronic kidney disease: Secondary | ICD-10-CM | POA: Diagnosis not present

## 2018-01-31 DIAGNOSIS — E039 Hypothyroidism, unspecified: Secondary | ICD-10-CM | POA: Diagnosis not present

## 2018-01-31 DIAGNOSIS — E1122 Type 2 diabetes mellitus with diabetic chronic kidney disease: Secondary | ICD-10-CM | POA: Diagnosis not present

## 2018-01-31 DIAGNOSIS — I120 Hypertensive chronic kidney disease with stage 5 chronic kidney disease or end stage renal disease: Secondary | ICD-10-CM | POA: Diagnosis not present

## 2018-01-31 DIAGNOSIS — N10 Acute tubulo-interstitial nephritis: Secondary | ICD-10-CM | POA: Diagnosis not present

## 2018-01-31 DIAGNOSIS — N185 Chronic kidney disease, stage 5: Secondary | ICD-10-CM | POA: Diagnosis not present

## 2018-02-01 DIAGNOSIS — N10 Acute tubulo-interstitial nephritis: Secondary | ICD-10-CM | POA: Diagnosis not present

## 2018-02-01 DIAGNOSIS — N185 Chronic kidney disease, stage 5: Secondary | ICD-10-CM | POA: Diagnosis not present

## 2018-02-01 DIAGNOSIS — D631 Anemia in chronic kidney disease: Secondary | ICD-10-CM | POA: Diagnosis not present

## 2018-02-01 DIAGNOSIS — E039 Hypothyroidism, unspecified: Secondary | ICD-10-CM | POA: Diagnosis not present

## 2018-02-01 DIAGNOSIS — I120 Hypertensive chronic kidney disease with stage 5 chronic kidney disease or end stage renal disease: Secondary | ICD-10-CM | POA: Diagnosis not present

## 2018-02-01 DIAGNOSIS — E1122 Type 2 diabetes mellitus with diabetic chronic kidney disease: Secondary | ICD-10-CM | POA: Diagnosis not present

## 2018-02-02 DIAGNOSIS — Z79899 Other long term (current) drug therapy: Secondary | ICD-10-CM | POA: Diagnosis not present

## 2018-02-02 DIAGNOSIS — N184 Chronic kidney disease, stage 4 (severe): Secondary | ICD-10-CM | POA: Diagnosis not present

## 2018-02-02 DIAGNOSIS — E559 Vitamin D deficiency, unspecified: Secondary | ICD-10-CM | POA: Diagnosis not present

## 2018-02-02 DIAGNOSIS — E119 Type 2 diabetes mellitus without complications: Secondary | ICD-10-CM | POA: Diagnosis not present

## 2018-02-02 DIAGNOSIS — K449 Diaphragmatic hernia without obstruction or gangrene: Secondary | ICD-10-CM | POA: Diagnosis not present

## 2018-02-02 DIAGNOSIS — Z8673 Personal history of transient ischemic attack (TIA), and cerebral infarction without residual deficits: Secondary | ICD-10-CM | POA: Diagnosis not present

## 2018-02-02 DIAGNOSIS — Z8546 Personal history of malignant neoplasm of prostate: Secondary | ICD-10-CM | POA: Diagnosis not present

## 2018-02-02 DIAGNOSIS — E039 Hypothyroidism, unspecified: Secondary | ICD-10-CM | POA: Diagnosis not present

## 2018-02-02 DIAGNOSIS — K317 Polyp of stomach and duodenum: Secondary | ICD-10-CM | POA: Diagnosis not present

## 2018-02-02 DIAGNOSIS — D631 Anemia in chronic kidney disease: Secondary | ICD-10-CM | POA: Diagnosis not present

## 2018-02-02 DIAGNOSIS — K3189 Other diseases of stomach and duodenum: Secondary | ICD-10-CM | POA: Diagnosis not present

## 2018-02-02 DIAGNOSIS — Z8711 Personal history of peptic ulcer disease: Secondary | ICD-10-CM | POA: Diagnosis not present

## 2018-02-02 DIAGNOSIS — R809 Proteinuria, unspecified: Secondary | ICD-10-CM | POA: Diagnosis not present

## 2018-02-04 DIAGNOSIS — E039 Hypothyroidism, unspecified: Secondary | ICD-10-CM | POA: Diagnosis not present

## 2018-02-04 DIAGNOSIS — E1122 Type 2 diabetes mellitus with diabetic chronic kidney disease: Secondary | ICD-10-CM | POA: Diagnosis not present

## 2018-02-04 DIAGNOSIS — N185 Chronic kidney disease, stage 5: Secondary | ICD-10-CM | POA: Diagnosis not present

## 2018-02-04 DIAGNOSIS — D631 Anemia in chronic kidney disease: Secondary | ICD-10-CM | POA: Diagnosis not present

## 2018-02-04 DIAGNOSIS — Z794 Long term (current) use of insulin: Secondary | ICD-10-CM | POA: Diagnosis not present

## 2018-02-04 DIAGNOSIS — M6281 Muscle weakness (generalized): Secondary | ICD-10-CM | POA: Diagnosis not present

## 2018-02-04 DIAGNOSIS — Z8673 Personal history of transient ischemic attack (TIA), and cerebral infarction without residual deficits: Secondary | ICD-10-CM | POA: Diagnosis not present

## 2018-02-04 DIAGNOSIS — M109 Gout, unspecified: Secondary | ICD-10-CM | POA: Diagnosis not present

## 2018-02-04 DIAGNOSIS — I120 Hypertensive chronic kidney disease with stage 5 chronic kidney disease or end stage renal disease: Secondary | ICD-10-CM | POA: Diagnosis not present

## 2018-02-04 DIAGNOSIS — Z8546 Personal history of malignant neoplasm of prostate: Secondary | ICD-10-CM | POA: Diagnosis not present

## 2018-02-06 DIAGNOSIS — M6281 Muscle weakness (generalized): Secondary | ICD-10-CM | POA: Diagnosis not present

## 2018-02-06 DIAGNOSIS — D631 Anemia in chronic kidney disease: Secondary | ICD-10-CM | POA: Diagnosis not present

## 2018-02-06 DIAGNOSIS — E1122 Type 2 diabetes mellitus with diabetic chronic kidney disease: Secondary | ICD-10-CM | POA: Diagnosis not present

## 2018-02-06 DIAGNOSIS — E039 Hypothyroidism, unspecified: Secondary | ICD-10-CM | POA: Diagnosis not present

## 2018-02-06 DIAGNOSIS — I120 Hypertensive chronic kidney disease with stage 5 chronic kidney disease or end stage renal disease: Secondary | ICD-10-CM | POA: Diagnosis not present

## 2018-02-06 DIAGNOSIS — N185 Chronic kidney disease, stage 5: Secondary | ICD-10-CM | POA: Diagnosis not present

## 2018-02-07 DIAGNOSIS — E039 Hypothyroidism, unspecified: Secondary | ICD-10-CM | POA: Diagnosis not present

## 2018-02-07 DIAGNOSIS — M6281 Muscle weakness (generalized): Secondary | ICD-10-CM | POA: Diagnosis not present

## 2018-02-07 DIAGNOSIS — I120 Hypertensive chronic kidney disease with stage 5 chronic kidney disease or end stage renal disease: Secondary | ICD-10-CM | POA: Diagnosis not present

## 2018-02-07 DIAGNOSIS — E1122 Type 2 diabetes mellitus with diabetic chronic kidney disease: Secondary | ICD-10-CM | POA: Diagnosis not present

## 2018-02-07 DIAGNOSIS — N185 Chronic kidney disease, stage 5: Secondary | ICD-10-CM | POA: Diagnosis not present

## 2018-02-07 DIAGNOSIS — D631 Anemia in chronic kidney disease: Secondary | ICD-10-CM | POA: Diagnosis not present

## 2018-02-08 DIAGNOSIS — K317 Polyp of stomach and duodenum: Secondary | ICD-10-CM | POA: Diagnosis not present

## 2018-02-09 DIAGNOSIS — M6281 Muscle weakness (generalized): Secondary | ICD-10-CM | POA: Diagnosis not present

## 2018-02-09 DIAGNOSIS — E1122 Type 2 diabetes mellitus with diabetic chronic kidney disease: Secondary | ICD-10-CM | POA: Diagnosis not present

## 2018-02-09 DIAGNOSIS — E039 Hypothyroidism, unspecified: Secondary | ICD-10-CM | POA: Diagnosis not present

## 2018-02-09 DIAGNOSIS — D631 Anemia in chronic kidney disease: Secondary | ICD-10-CM | POA: Diagnosis not present

## 2018-02-09 DIAGNOSIS — N185 Chronic kidney disease, stage 5: Secondary | ICD-10-CM | POA: Diagnosis not present

## 2018-02-09 DIAGNOSIS — I120 Hypertensive chronic kidney disease with stage 5 chronic kidney disease or end stage renal disease: Secondary | ICD-10-CM | POA: Diagnosis not present

## 2018-02-10 DIAGNOSIS — N185 Chronic kidney disease, stage 5: Secondary | ICD-10-CM | POA: Diagnosis not present

## 2018-02-10 DIAGNOSIS — E1122 Type 2 diabetes mellitus with diabetic chronic kidney disease: Secondary | ICD-10-CM | POA: Diagnosis not present

## 2018-02-10 DIAGNOSIS — E039 Hypothyroidism, unspecified: Secondary | ICD-10-CM | POA: Diagnosis not present

## 2018-02-10 DIAGNOSIS — I120 Hypertensive chronic kidney disease with stage 5 chronic kidney disease or end stage renal disease: Secondary | ICD-10-CM | POA: Diagnosis not present

## 2018-02-10 DIAGNOSIS — M6281 Muscle weakness (generalized): Secondary | ICD-10-CM | POA: Diagnosis not present

## 2018-02-10 DIAGNOSIS — D631 Anemia in chronic kidney disease: Secondary | ICD-10-CM | POA: Diagnosis not present

## 2018-02-13 DIAGNOSIS — M6281 Muscle weakness (generalized): Secondary | ICD-10-CM | POA: Diagnosis not present

## 2018-02-13 DIAGNOSIS — N185 Chronic kidney disease, stage 5: Secondary | ICD-10-CM | POA: Diagnosis not present

## 2018-02-13 DIAGNOSIS — D631 Anemia in chronic kidney disease: Secondary | ICD-10-CM | POA: Diagnosis not present

## 2018-02-13 DIAGNOSIS — E1122 Type 2 diabetes mellitus with diabetic chronic kidney disease: Secondary | ICD-10-CM | POA: Diagnosis not present

## 2018-02-13 DIAGNOSIS — E039 Hypothyroidism, unspecified: Secondary | ICD-10-CM | POA: Diagnosis not present

## 2018-02-13 DIAGNOSIS — I120 Hypertensive chronic kidney disease with stage 5 chronic kidney disease or end stage renal disease: Secondary | ICD-10-CM | POA: Diagnosis not present

## 2018-02-14 DIAGNOSIS — N185 Chronic kidney disease, stage 5: Secondary | ICD-10-CM | POA: Diagnosis not present

## 2018-02-14 DIAGNOSIS — D631 Anemia in chronic kidney disease: Secondary | ICD-10-CM | POA: Diagnosis not present

## 2018-02-14 DIAGNOSIS — E039 Hypothyroidism, unspecified: Secondary | ICD-10-CM | POA: Diagnosis not present

## 2018-02-14 DIAGNOSIS — M6281 Muscle weakness (generalized): Secondary | ICD-10-CM | POA: Diagnosis not present

## 2018-02-14 DIAGNOSIS — I120 Hypertensive chronic kidney disease with stage 5 chronic kidney disease or end stage renal disease: Secondary | ICD-10-CM | POA: Diagnosis not present

## 2018-02-14 DIAGNOSIS — E1122 Type 2 diabetes mellitus with diabetic chronic kidney disease: Secondary | ICD-10-CM | POA: Diagnosis not present

## 2018-02-15 DIAGNOSIS — E039 Hypothyroidism, unspecified: Secondary | ICD-10-CM | POA: Diagnosis not present

## 2018-02-15 DIAGNOSIS — I120 Hypertensive chronic kidney disease with stage 5 chronic kidney disease or end stage renal disease: Secondary | ICD-10-CM | POA: Diagnosis not present

## 2018-02-15 DIAGNOSIS — M6281 Muscle weakness (generalized): Secondary | ICD-10-CM | POA: Diagnosis not present

## 2018-02-15 DIAGNOSIS — E1122 Type 2 diabetes mellitus with diabetic chronic kidney disease: Secondary | ICD-10-CM | POA: Diagnosis not present

## 2018-02-15 DIAGNOSIS — D631 Anemia in chronic kidney disease: Secondary | ICD-10-CM | POA: Diagnosis not present

## 2018-02-15 DIAGNOSIS — N185 Chronic kidney disease, stage 5: Secondary | ICD-10-CM | POA: Diagnosis not present

## 2018-02-16 DIAGNOSIS — R809 Proteinuria, unspecified: Secondary | ICD-10-CM | POA: Diagnosis not present

## 2018-02-16 DIAGNOSIS — E1122 Type 2 diabetes mellitus with diabetic chronic kidney disease: Secondary | ICD-10-CM | POA: Diagnosis not present

## 2018-02-16 DIAGNOSIS — Z1339 Encounter for screening examination for other mental health and behavioral disorders: Secondary | ICD-10-CM | POA: Diagnosis not present

## 2018-02-16 DIAGNOSIS — N184 Chronic kidney disease, stage 4 (severe): Secondary | ICD-10-CM | POA: Diagnosis not present

## 2018-02-16 DIAGNOSIS — Z1331 Encounter for screening for depression: Secondary | ICD-10-CM | POA: Diagnosis not present

## 2018-02-16 DIAGNOSIS — Z6830 Body mass index (BMI) 30.0-30.9, adult: Secondary | ICD-10-CM | POA: Diagnosis not present

## 2018-02-16 DIAGNOSIS — E1165 Type 2 diabetes mellitus with hyperglycemia: Secondary | ICD-10-CM | POA: Diagnosis not present

## 2018-02-16 DIAGNOSIS — E559 Vitamin D deficiency, unspecified: Secondary | ICD-10-CM | POA: Diagnosis not present

## 2018-02-16 DIAGNOSIS — I5030 Unspecified diastolic (congestive) heart failure: Secondary | ICD-10-CM | POA: Diagnosis not present

## 2018-02-16 DIAGNOSIS — Z299 Encounter for prophylactic measures, unspecified: Secondary | ICD-10-CM | POA: Diagnosis not present

## 2018-02-16 DIAGNOSIS — D631 Anemia in chronic kidney disease: Secondary | ICD-10-CM | POA: Diagnosis not present

## 2018-02-16 DIAGNOSIS — Z0000 Encounter for general adult medical examination without abnormal findings: Secondary | ICD-10-CM | POA: Diagnosis not present

## 2018-02-16 DIAGNOSIS — Z79899 Other long term (current) drug therapy: Secondary | ICD-10-CM | POA: Diagnosis not present

## 2018-02-16 DIAGNOSIS — Z7189 Other specified counseling: Secondary | ICD-10-CM | POA: Diagnosis not present

## 2018-02-16 DIAGNOSIS — C61 Malignant neoplasm of prostate: Secondary | ICD-10-CM | POA: Diagnosis not present

## 2018-02-17 DIAGNOSIS — N185 Chronic kidney disease, stage 5: Secondary | ICD-10-CM | POA: Diagnosis not present

## 2018-02-17 DIAGNOSIS — E1122 Type 2 diabetes mellitus with diabetic chronic kidney disease: Secondary | ICD-10-CM | POA: Diagnosis not present

## 2018-02-17 DIAGNOSIS — M6281 Muscle weakness (generalized): Secondary | ICD-10-CM | POA: Diagnosis not present

## 2018-02-17 DIAGNOSIS — I120 Hypertensive chronic kidney disease with stage 5 chronic kidney disease or end stage renal disease: Secondary | ICD-10-CM | POA: Diagnosis not present

## 2018-02-17 DIAGNOSIS — E039 Hypothyroidism, unspecified: Secondary | ICD-10-CM | POA: Diagnosis not present

## 2018-02-17 DIAGNOSIS — D631 Anemia in chronic kidney disease: Secondary | ICD-10-CM | POA: Diagnosis not present

## 2018-02-20 DIAGNOSIS — N185 Chronic kidney disease, stage 5: Secondary | ICD-10-CM | POA: Diagnosis not present

## 2018-02-20 DIAGNOSIS — M6281 Muscle weakness (generalized): Secondary | ICD-10-CM | POA: Diagnosis not present

## 2018-02-20 DIAGNOSIS — I120 Hypertensive chronic kidney disease with stage 5 chronic kidney disease or end stage renal disease: Secondary | ICD-10-CM | POA: Diagnosis not present

## 2018-02-20 DIAGNOSIS — E039 Hypothyroidism, unspecified: Secondary | ICD-10-CM | POA: Diagnosis not present

## 2018-02-20 DIAGNOSIS — E1122 Type 2 diabetes mellitus with diabetic chronic kidney disease: Secondary | ICD-10-CM | POA: Diagnosis not present

## 2018-02-20 DIAGNOSIS — D631 Anemia in chronic kidney disease: Secondary | ICD-10-CM | POA: Diagnosis not present

## 2018-02-22 DIAGNOSIS — I120 Hypertensive chronic kidney disease with stage 5 chronic kidney disease or end stage renal disease: Secondary | ICD-10-CM | POA: Diagnosis not present

## 2018-02-22 DIAGNOSIS — E039 Hypothyroidism, unspecified: Secondary | ICD-10-CM | POA: Diagnosis not present

## 2018-02-22 DIAGNOSIS — D631 Anemia in chronic kidney disease: Secondary | ICD-10-CM | POA: Diagnosis not present

## 2018-02-22 DIAGNOSIS — E1122 Type 2 diabetes mellitus with diabetic chronic kidney disease: Secondary | ICD-10-CM | POA: Diagnosis not present

## 2018-02-22 DIAGNOSIS — M6281 Muscle weakness (generalized): Secondary | ICD-10-CM | POA: Diagnosis not present

## 2018-02-22 DIAGNOSIS — N185 Chronic kidney disease, stage 5: Secondary | ICD-10-CM | POA: Diagnosis not present

## 2018-02-23 DIAGNOSIS — N185 Chronic kidney disease, stage 5: Secondary | ICD-10-CM | POA: Diagnosis not present

## 2018-02-23 DIAGNOSIS — E039 Hypothyroidism, unspecified: Secondary | ICD-10-CM | POA: Diagnosis not present

## 2018-02-23 DIAGNOSIS — I120 Hypertensive chronic kidney disease with stage 5 chronic kidney disease or end stage renal disease: Secondary | ICD-10-CM | POA: Diagnosis not present

## 2018-02-23 DIAGNOSIS — D631 Anemia in chronic kidney disease: Secondary | ICD-10-CM | POA: Diagnosis not present

## 2018-02-23 DIAGNOSIS — M6281 Muscle weakness (generalized): Secondary | ICD-10-CM | POA: Diagnosis not present

## 2018-02-23 DIAGNOSIS — E1122 Type 2 diabetes mellitus with diabetic chronic kidney disease: Secondary | ICD-10-CM | POA: Diagnosis not present

## 2018-02-24 DIAGNOSIS — I120 Hypertensive chronic kidney disease with stage 5 chronic kidney disease or end stage renal disease: Secondary | ICD-10-CM | POA: Diagnosis not present

## 2018-02-24 DIAGNOSIS — E1122 Type 2 diabetes mellitus with diabetic chronic kidney disease: Secondary | ICD-10-CM | POA: Diagnosis not present

## 2018-02-24 DIAGNOSIS — N185 Chronic kidney disease, stage 5: Secondary | ICD-10-CM | POA: Diagnosis not present

## 2018-02-24 DIAGNOSIS — D631 Anemia in chronic kidney disease: Secondary | ICD-10-CM | POA: Diagnosis not present

## 2018-02-24 DIAGNOSIS — M6281 Muscle weakness (generalized): Secondary | ICD-10-CM | POA: Diagnosis not present

## 2018-02-24 DIAGNOSIS — E039 Hypothyroidism, unspecified: Secondary | ICD-10-CM | POA: Diagnosis not present

## 2018-02-27 DIAGNOSIS — N185 Chronic kidney disease, stage 5: Secondary | ICD-10-CM | POA: Diagnosis not present

## 2018-02-27 DIAGNOSIS — E1122 Type 2 diabetes mellitus with diabetic chronic kidney disease: Secondary | ICD-10-CM | POA: Diagnosis not present

## 2018-02-27 DIAGNOSIS — M6281 Muscle weakness (generalized): Secondary | ICD-10-CM | POA: Diagnosis not present

## 2018-02-27 DIAGNOSIS — D631 Anemia in chronic kidney disease: Secondary | ICD-10-CM | POA: Diagnosis not present

## 2018-02-27 DIAGNOSIS — I120 Hypertensive chronic kidney disease with stage 5 chronic kidney disease or end stage renal disease: Secondary | ICD-10-CM | POA: Diagnosis not present

## 2018-02-27 DIAGNOSIS — E039 Hypothyroidism, unspecified: Secondary | ICD-10-CM | POA: Diagnosis not present

## 2018-03-01 DIAGNOSIS — E039 Hypothyroidism, unspecified: Secondary | ICD-10-CM | POA: Diagnosis not present

## 2018-03-01 DIAGNOSIS — E1122 Type 2 diabetes mellitus with diabetic chronic kidney disease: Secondary | ICD-10-CM | POA: Diagnosis not present

## 2018-03-01 DIAGNOSIS — M6281 Muscle weakness (generalized): Secondary | ICD-10-CM | POA: Diagnosis not present

## 2018-03-01 DIAGNOSIS — N185 Chronic kidney disease, stage 5: Secondary | ICD-10-CM | POA: Diagnosis not present

## 2018-03-01 DIAGNOSIS — D631 Anemia in chronic kidney disease: Secondary | ICD-10-CM | POA: Diagnosis not present

## 2018-03-01 DIAGNOSIS — I120 Hypertensive chronic kidney disease with stage 5 chronic kidney disease or end stage renal disease: Secondary | ICD-10-CM | POA: Diagnosis not present

## 2018-03-02 DIAGNOSIS — M6281 Muscle weakness (generalized): Secondary | ICD-10-CM | POA: Diagnosis not present

## 2018-03-02 DIAGNOSIS — D631 Anemia in chronic kidney disease: Secondary | ICD-10-CM | POA: Diagnosis not present

## 2018-03-02 DIAGNOSIS — I120 Hypertensive chronic kidney disease with stage 5 chronic kidney disease or end stage renal disease: Secondary | ICD-10-CM | POA: Diagnosis not present

## 2018-03-02 DIAGNOSIS — R809 Proteinuria, unspecified: Secondary | ICD-10-CM | POA: Diagnosis not present

## 2018-03-02 DIAGNOSIS — E039 Hypothyroidism, unspecified: Secondary | ICD-10-CM | POA: Diagnosis not present

## 2018-03-02 DIAGNOSIS — E1122 Type 2 diabetes mellitus with diabetic chronic kidney disease: Secondary | ICD-10-CM | POA: Diagnosis not present

## 2018-03-02 DIAGNOSIS — Z79899 Other long term (current) drug therapy: Secondary | ICD-10-CM | POA: Diagnosis not present

## 2018-03-02 DIAGNOSIS — N185 Chronic kidney disease, stage 5: Secondary | ICD-10-CM | POA: Diagnosis not present

## 2018-03-02 DIAGNOSIS — N184 Chronic kidney disease, stage 4 (severe): Secondary | ICD-10-CM | POA: Diagnosis not present

## 2018-03-02 DIAGNOSIS — E559 Vitamin D deficiency, unspecified: Secondary | ICD-10-CM | POA: Diagnosis not present

## 2018-03-03 DIAGNOSIS — I120 Hypertensive chronic kidney disease with stage 5 chronic kidney disease or end stage renal disease: Secondary | ICD-10-CM | POA: Diagnosis not present

## 2018-03-03 DIAGNOSIS — D631 Anemia in chronic kidney disease: Secondary | ICD-10-CM | POA: Diagnosis not present

## 2018-03-03 DIAGNOSIS — M6281 Muscle weakness (generalized): Secondary | ICD-10-CM | POA: Diagnosis not present

## 2018-03-03 DIAGNOSIS — E039 Hypothyroidism, unspecified: Secondary | ICD-10-CM | POA: Diagnosis not present

## 2018-03-03 DIAGNOSIS — N185 Chronic kidney disease, stage 5: Secondary | ICD-10-CM | POA: Diagnosis not present

## 2018-03-03 DIAGNOSIS — E1122 Type 2 diabetes mellitus with diabetic chronic kidney disease: Secondary | ICD-10-CM | POA: Diagnosis not present

## 2018-03-06 DIAGNOSIS — E039 Hypothyroidism, unspecified: Secondary | ICD-10-CM | POA: Diagnosis not present

## 2018-03-06 DIAGNOSIS — I120 Hypertensive chronic kidney disease with stage 5 chronic kidney disease or end stage renal disease: Secondary | ICD-10-CM | POA: Diagnosis not present

## 2018-03-06 DIAGNOSIS — E1122 Type 2 diabetes mellitus with diabetic chronic kidney disease: Secondary | ICD-10-CM | POA: Diagnosis not present

## 2018-03-06 DIAGNOSIS — N185 Chronic kidney disease, stage 5: Secondary | ICD-10-CM | POA: Diagnosis not present

## 2018-03-06 DIAGNOSIS — M6281 Muscle weakness (generalized): Secondary | ICD-10-CM | POA: Diagnosis not present

## 2018-03-06 DIAGNOSIS — D631 Anemia in chronic kidney disease: Secondary | ICD-10-CM | POA: Diagnosis not present

## 2018-03-08 DIAGNOSIS — D631 Anemia in chronic kidney disease: Secondary | ICD-10-CM | POA: Diagnosis not present

## 2018-03-08 DIAGNOSIS — E1122 Type 2 diabetes mellitus with diabetic chronic kidney disease: Secondary | ICD-10-CM | POA: Diagnosis not present

## 2018-03-08 DIAGNOSIS — M6281 Muscle weakness (generalized): Secondary | ICD-10-CM | POA: Diagnosis not present

## 2018-03-08 DIAGNOSIS — E039 Hypothyroidism, unspecified: Secondary | ICD-10-CM | POA: Diagnosis not present

## 2018-03-08 DIAGNOSIS — I120 Hypertensive chronic kidney disease with stage 5 chronic kidney disease or end stage renal disease: Secondary | ICD-10-CM | POA: Diagnosis not present

## 2018-03-08 DIAGNOSIS — N185 Chronic kidney disease, stage 5: Secondary | ICD-10-CM | POA: Diagnosis not present

## 2018-03-09 DIAGNOSIS — N185 Chronic kidney disease, stage 5: Secondary | ICD-10-CM | POA: Diagnosis not present

## 2018-03-09 DIAGNOSIS — D631 Anemia in chronic kidney disease: Secondary | ICD-10-CM | POA: Diagnosis not present

## 2018-03-09 DIAGNOSIS — E039 Hypothyroidism, unspecified: Secondary | ICD-10-CM | POA: Diagnosis not present

## 2018-03-09 DIAGNOSIS — E1122 Type 2 diabetes mellitus with diabetic chronic kidney disease: Secondary | ICD-10-CM | POA: Diagnosis not present

## 2018-03-09 DIAGNOSIS — M6281 Muscle weakness (generalized): Secondary | ICD-10-CM | POA: Diagnosis not present

## 2018-03-09 DIAGNOSIS — I120 Hypertensive chronic kidney disease with stage 5 chronic kidney disease or end stage renal disease: Secondary | ICD-10-CM | POA: Diagnosis not present

## 2018-03-10 DIAGNOSIS — N185 Chronic kidney disease, stage 5: Secondary | ICD-10-CM | POA: Diagnosis not present

## 2018-03-10 DIAGNOSIS — E039 Hypothyroidism, unspecified: Secondary | ICD-10-CM | POA: Diagnosis not present

## 2018-03-10 DIAGNOSIS — D631 Anemia in chronic kidney disease: Secondary | ICD-10-CM | POA: Diagnosis not present

## 2018-03-10 DIAGNOSIS — I120 Hypertensive chronic kidney disease with stage 5 chronic kidney disease or end stage renal disease: Secondary | ICD-10-CM | POA: Diagnosis not present

## 2018-03-10 DIAGNOSIS — M6281 Muscle weakness (generalized): Secondary | ICD-10-CM | POA: Diagnosis not present

## 2018-03-10 DIAGNOSIS — E1122 Type 2 diabetes mellitus with diabetic chronic kidney disease: Secondary | ICD-10-CM | POA: Diagnosis not present

## 2018-03-14 DIAGNOSIS — M6281 Muscle weakness (generalized): Secondary | ICD-10-CM | POA: Diagnosis not present

## 2018-03-14 DIAGNOSIS — I120 Hypertensive chronic kidney disease with stage 5 chronic kidney disease or end stage renal disease: Secondary | ICD-10-CM | POA: Diagnosis not present

## 2018-03-14 DIAGNOSIS — E1122 Type 2 diabetes mellitus with diabetic chronic kidney disease: Secondary | ICD-10-CM | POA: Diagnosis not present

## 2018-03-14 DIAGNOSIS — D631 Anemia in chronic kidney disease: Secondary | ICD-10-CM | POA: Diagnosis not present

## 2018-03-14 DIAGNOSIS — E039 Hypothyroidism, unspecified: Secondary | ICD-10-CM | POA: Diagnosis not present

## 2018-03-14 DIAGNOSIS — N185 Chronic kidney disease, stage 5: Secondary | ICD-10-CM | POA: Diagnosis not present

## 2018-03-15 DIAGNOSIS — I120 Hypertensive chronic kidney disease with stage 5 chronic kidney disease or end stage renal disease: Secondary | ICD-10-CM | POA: Diagnosis not present

## 2018-03-15 DIAGNOSIS — E1122 Type 2 diabetes mellitus with diabetic chronic kidney disease: Secondary | ICD-10-CM | POA: Diagnosis not present

## 2018-03-15 DIAGNOSIS — N185 Chronic kidney disease, stage 5: Secondary | ICD-10-CM | POA: Diagnosis not present

## 2018-03-15 DIAGNOSIS — M6281 Muscle weakness (generalized): Secondary | ICD-10-CM | POA: Diagnosis not present

## 2018-03-15 DIAGNOSIS — E039 Hypothyroidism, unspecified: Secondary | ICD-10-CM | POA: Diagnosis not present

## 2018-03-15 DIAGNOSIS — D631 Anemia in chronic kidney disease: Secondary | ICD-10-CM | POA: Diagnosis not present

## 2018-03-16 DIAGNOSIS — E039 Hypothyroidism, unspecified: Secondary | ICD-10-CM | POA: Diagnosis not present

## 2018-03-16 DIAGNOSIS — M6281 Muscle weakness (generalized): Secondary | ICD-10-CM | POA: Diagnosis not present

## 2018-03-16 DIAGNOSIS — E1122 Type 2 diabetes mellitus with diabetic chronic kidney disease: Secondary | ICD-10-CM | POA: Diagnosis not present

## 2018-03-16 DIAGNOSIS — I120 Hypertensive chronic kidney disease with stage 5 chronic kidney disease or end stage renal disease: Secondary | ICD-10-CM | POA: Diagnosis not present

## 2018-03-16 DIAGNOSIS — E1140 Type 2 diabetes mellitus with diabetic neuropathy, unspecified: Secondary | ICD-10-CM | POA: Diagnosis not present

## 2018-03-16 DIAGNOSIS — N185 Chronic kidney disease, stage 5: Secondary | ICD-10-CM | POA: Diagnosis not present

## 2018-03-16 DIAGNOSIS — E1151 Type 2 diabetes mellitus with diabetic peripheral angiopathy without gangrene: Secondary | ICD-10-CM | POA: Diagnosis not present

## 2018-03-16 DIAGNOSIS — D631 Anemia in chronic kidney disease: Secondary | ICD-10-CM | POA: Diagnosis not present

## 2018-03-17 DIAGNOSIS — D631 Anemia in chronic kidney disease: Secondary | ICD-10-CM | POA: Diagnosis not present

## 2018-03-17 DIAGNOSIS — E1122 Type 2 diabetes mellitus with diabetic chronic kidney disease: Secondary | ICD-10-CM | POA: Diagnosis not present

## 2018-03-17 DIAGNOSIS — E039 Hypothyroidism, unspecified: Secondary | ICD-10-CM | POA: Diagnosis not present

## 2018-03-17 DIAGNOSIS — I120 Hypertensive chronic kidney disease with stage 5 chronic kidney disease or end stage renal disease: Secondary | ICD-10-CM | POA: Diagnosis not present

## 2018-03-17 DIAGNOSIS — N185 Chronic kidney disease, stage 5: Secondary | ICD-10-CM | POA: Diagnosis not present

## 2018-03-17 DIAGNOSIS — M6281 Muscle weakness (generalized): Secondary | ICD-10-CM | POA: Diagnosis not present

## 2018-03-20 DIAGNOSIS — D631 Anemia in chronic kidney disease: Secondary | ICD-10-CM | POA: Diagnosis not present

## 2018-03-20 DIAGNOSIS — I120 Hypertensive chronic kidney disease with stage 5 chronic kidney disease or end stage renal disease: Secondary | ICD-10-CM | POA: Diagnosis not present

## 2018-03-20 DIAGNOSIS — M6281 Muscle weakness (generalized): Secondary | ICD-10-CM | POA: Diagnosis not present

## 2018-03-20 DIAGNOSIS — E039 Hypothyroidism, unspecified: Secondary | ICD-10-CM | POA: Diagnosis not present

## 2018-03-20 DIAGNOSIS — N185 Chronic kidney disease, stage 5: Secondary | ICD-10-CM | POA: Diagnosis not present

## 2018-03-20 DIAGNOSIS — E1122 Type 2 diabetes mellitus with diabetic chronic kidney disease: Secondary | ICD-10-CM | POA: Diagnosis not present

## 2018-03-22 DIAGNOSIS — E039 Hypothyroidism, unspecified: Secondary | ICD-10-CM | POA: Diagnosis not present

## 2018-03-22 DIAGNOSIS — M6281 Muscle weakness (generalized): Secondary | ICD-10-CM | POA: Diagnosis not present

## 2018-03-22 DIAGNOSIS — I120 Hypertensive chronic kidney disease with stage 5 chronic kidney disease or end stage renal disease: Secondary | ICD-10-CM | POA: Diagnosis not present

## 2018-03-22 DIAGNOSIS — D631 Anemia in chronic kidney disease: Secondary | ICD-10-CM | POA: Diagnosis not present

## 2018-03-22 DIAGNOSIS — N185 Chronic kidney disease, stage 5: Secondary | ICD-10-CM | POA: Diagnosis not present

## 2018-03-22 DIAGNOSIS — E1122 Type 2 diabetes mellitus with diabetic chronic kidney disease: Secondary | ICD-10-CM | POA: Diagnosis not present

## 2018-03-23 DIAGNOSIS — E559 Vitamin D deficiency, unspecified: Secondary | ICD-10-CM | POA: Diagnosis not present

## 2018-03-23 DIAGNOSIS — R809 Proteinuria, unspecified: Secondary | ICD-10-CM | POA: Diagnosis not present

## 2018-03-23 DIAGNOSIS — D631 Anemia in chronic kidney disease: Secondary | ICD-10-CM | POA: Diagnosis not present

## 2018-03-23 DIAGNOSIS — M6281 Muscle weakness (generalized): Secondary | ICD-10-CM | POA: Diagnosis not present

## 2018-03-23 DIAGNOSIS — E1122 Type 2 diabetes mellitus with diabetic chronic kidney disease: Secondary | ICD-10-CM | POA: Diagnosis not present

## 2018-03-23 DIAGNOSIS — I120 Hypertensive chronic kidney disease with stage 5 chronic kidney disease or end stage renal disease: Secondary | ICD-10-CM | POA: Diagnosis not present

## 2018-03-23 DIAGNOSIS — E039 Hypothyroidism, unspecified: Secondary | ICD-10-CM | POA: Diagnosis not present

## 2018-03-23 DIAGNOSIS — Z79899 Other long term (current) drug therapy: Secondary | ICD-10-CM | POA: Diagnosis not present

## 2018-03-23 DIAGNOSIS — N181 Chronic kidney disease, stage 1: Secondary | ICD-10-CM | POA: Diagnosis not present

## 2018-03-23 DIAGNOSIS — N185 Chronic kidney disease, stage 5: Secondary | ICD-10-CM | POA: Diagnosis not present

## 2018-03-24 DIAGNOSIS — I120 Hypertensive chronic kidney disease with stage 5 chronic kidney disease or end stage renal disease: Secondary | ICD-10-CM | POA: Diagnosis not present

## 2018-03-24 DIAGNOSIS — E039 Hypothyroidism, unspecified: Secondary | ICD-10-CM | POA: Diagnosis not present

## 2018-03-24 DIAGNOSIS — E1122 Type 2 diabetes mellitus with diabetic chronic kidney disease: Secondary | ICD-10-CM | POA: Diagnosis not present

## 2018-03-24 DIAGNOSIS — N185 Chronic kidney disease, stage 5: Secondary | ICD-10-CM | POA: Diagnosis not present

## 2018-03-24 DIAGNOSIS — M6281 Muscle weakness (generalized): Secondary | ICD-10-CM | POA: Diagnosis not present

## 2018-03-24 DIAGNOSIS — D631 Anemia in chronic kidney disease: Secondary | ICD-10-CM | POA: Diagnosis not present

## 2018-03-27 DIAGNOSIS — N185 Chronic kidney disease, stage 5: Secondary | ICD-10-CM | POA: Diagnosis not present

## 2018-03-27 DIAGNOSIS — E039 Hypothyroidism, unspecified: Secondary | ICD-10-CM | POA: Diagnosis not present

## 2018-03-27 DIAGNOSIS — D631 Anemia in chronic kidney disease: Secondary | ICD-10-CM | POA: Diagnosis not present

## 2018-03-27 DIAGNOSIS — M6281 Muscle weakness (generalized): Secondary | ICD-10-CM | POA: Diagnosis not present

## 2018-03-27 DIAGNOSIS — E1122 Type 2 diabetes mellitus with diabetic chronic kidney disease: Secondary | ICD-10-CM | POA: Diagnosis not present

## 2018-03-27 DIAGNOSIS — I120 Hypertensive chronic kidney disease with stage 5 chronic kidney disease or end stage renal disease: Secondary | ICD-10-CM | POA: Diagnosis not present

## 2018-03-28 DIAGNOSIS — E039 Hypothyroidism, unspecified: Secondary | ICD-10-CM | POA: Diagnosis not present

## 2018-03-28 DIAGNOSIS — D631 Anemia in chronic kidney disease: Secondary | ICD-10-CM | POA: Diagnosis not present

## 2018-03-28 DIAGNOSIS — E1122 Type 2 diabetes mellitus with diabetic chronic kidney disease: Secondary | ICD-10-CM | POA: Diagnosis not present

## 2018-03-28 DIAGNOSIS — I120 Hypertensive chronic kidney disease with stage 5 chronic kidney disease or end stage renal disease: Secondary | ICD-10-CM | POA: Diagnosis not present

## 2018-03-28 DIAGNOSIS — M6281 Muscle weakness (generalized): Secondary | ICD-10-CM | POA: Diagnosis not present

## 2018-03-28 DIAGNOSIS — N185 Chronic kidney disease, stage 5: Secondary | ICD-10-CM | POA: Diagnosis not present

## 2018-03-31 DIAGNOSIS — E1122 Type 2 diabetes mellitus with diabetic chronic kidney disease: Secondary | ICD-10-CM | POA: Diagnosis not present

## 2018-03-31 DIAGNOSIS — I120 Hypertensive chronic kidney disease with stage 5 chronic kidney disease or end stage renal disease: Secondary | ICD-10-CM | POA: Diagnosis not present

## 2018-03-31 DIAGNOSIS — E039 Hypothyroidism, unspecified: Secondary | ICD-10-CM | POA: Diagnosis not present

## 2018-03-31 DIAGNOSIS — N185 Chronic kidney disease, stage 5: Secondary | ICD-10-CM | POA: Diagnosis not present

## 2018-03-31 DIAGNOSIS — M6281 Muscle weakness (generalized): Secondary | ICD-10-CM | POA: Diagnosis not present

## 2018-03-31 DIAGNOSIS — D631 Anemia in chronic kidney disease: Secondary | ICD-10-CM | POA: Diagnosis not present

## 2018-04-05 DIAGNOSIS — I639 Cerebral infarction, unspecified: Secondary | ICD-10-CM | POA: Diagnosis not present

## 2018-04-05 DIAGNOSIS — E039 Hypothyroidism, unspecified: Secondary | ICD-10-CM | POA: Diagnosis not present

## 2018-04-05 DIAGNOSIS — E119 Type 2 diabetes mellitus without complications: Secondary | ICD-10-CM | POA: Diagnosis not present

## 2018-04-05 DIAGNOSIS — I120 Hypertensive chronic kidney disease with stage 5 chronic kidney disease or end stage renal disease: Secondary | ICD-10-CM | POA: Diagnosis not present

## 2018-04-05 DIAGNOSIS — M159 Polyosteoarthritis, unspecified: Secondary | ICD-10-CM | POA: Diagnosis not present

## 2018-04-05 DIAGNOSIS — N185 Chronic kidney disease, stage 5: Secondary | ICD-10-CM | POA: Diagnosis not present

## 2018-04-05 DIAGNOSIS — E1122 Type 2 diabetes mellitus with diabetic chronic kidney disease: Secondary | ICD-10-CM | POA: Diagnosis not present

## 2018-04-05 DIAGNOSIS — D631 Anemia in chronic kidney disease: Secondary | ICD-10-CM | POA: Diagnosis not present

## 2018-04-05 DIAGNOSIS — M6281 Muscle weakness (generalized): Secondary | ICD-10-CM | POA: Diagnosis not present

## 2018-04-05 DIAGNOSIS — Z8546 Personal history of malignant neoplasm of prostate: Secondary | ICD-10-CM | POA: Diagnosis not present

## 2018-04-05 DIAGNOSIS — Z794 Long term (current) use of insulin: Secondary | ICD-10-CM | POA: Diagnosis not present

## 2018-04-05 DIAGNOSIS — M109 Gout, unspecified: Secondary | ICD-10-CM | POA: Diagnosis not present

## 2018-04-05 DIAGNOSIS — Z8673 Personal history of transient ischemic attack (TIA), and cerebral infarction without residual deficits: Secondary | ICD-10-CM | POA: Diagnosis not present

## 2018-04-06 DIAGNOSIS — N185 Chronic kidney disease, stage 5: Secondary | ICD-10-CM | POA: Diagnosis not present

## 2018-04-06 DIAGNOSIS — Z79899 Other long term (current) drug therapy: Secondary | ICD-10-CM | POA: Diagnosis not present

## 2018-04-06 DIAGNOSIS — E1122 Type 2 diabetes mellitus with diabetic chronic kidney disease: Secondary | ICD-10-CM | POA: Diagnosis not present

## 2018-04-06 DIAGNOSIS — I120 Hypertensive chronic kidney disease with stage 5 chronic kidney disease or end stage renal disease: Secondary | ICD-10-CM | POA: Diagnosis not present

## 2018-04-06 DIAGNOSIS — E559 Vitamin D deficiency, unspecified: Secondary | ICD-10-CM | POA: Diagnosis not present

## 2018-04-06 DIAGNOSIS — M6281 Muscle weakness (generalized): Secondary | ICD-10-CM | POA: Diagnosis not present

## 2018-04-06 DIAGNOSIS — N181 Chronic kidney disease, stage 1: Secondary | ICD-10-CM | POA: Diagnosis not present

## 2018-04-06 DIAGNOSIS — D631 Anemia in chronic kidney disease: Secondary | ICD-10-CM | POA: Diagnosis not present

## 2018-04-06 DIAGNOSIS — R809 Proteinuria, unspecified: Secondary | ICD-10-CM | POA: Diagnosis not present

## 2018-04-06 DIAGNOSIS — E039 Hypothyroidism, unspecified: Secondary | ICD-10-CM | POA: Diagnosis not present

## 2018-04-11 DIAGNOSIS — E872 Acidosis: Secondary | ICD-10-CM | POA: Diagnosis not present

## 2018-04-11 DIAGNOSIS — R809 Proteinuria, unspecified: Secondary | ICD-10-CM | POA: Diagnosis not present

## 2018-04-11 DIAGNOSIS — I10 Essential (primary) hypertension: Secondary | ICD-10-CM | POA: Diagnosis not present

## 2018-04-11 DIAGNOSIS — N185 Chronic kidney disease, stage 5: Secondary | ICD-10-CM | POA: Diagnosis not present

## 2018-04-11 DIAGNOSIS — E1129 Type 2 diabetes mellitus with other diabetic kidney complication: Secondary | ICD-10-CM | POA: Diagnosis not present

## 2018-04-11 DIAGNOSIS — I509 Heart failure, unspecified: Secondary | ICD-10-CM | POA: Diagnosis not present

## 2018-04-14 DIAGNOSIS — I120 Hypertensive chronic kidney disease with stage 5 chronic kidney disease or end stage renal disease: Secondary | ICD-10-CM | POA: Diagnosis not present

## 2018-04-14 DIAGNOSIS — D631 Anemia in chronic kidney disease: Secondary | ICD-10-CM | POA: Diagnosis not present

## 2018-04-14 DIAGNOSIS — N185 Chronic kidney disease, stage 5: Secondary | ICD-10-CM | POA: Diagnosis not present

## 2018-04-14 DIAGNOSIS — M6281 Muscle weakness (generalized): Secondary | ICD-10-CM | POA: Diagnosis not present

## 2018-04-14 DIAGNOSIS — E039 Hypothyroidism, unspecified: Secondary | ICD-10-CM | POA: Diagnosis not present

## 2018-04-14 DIAGNOSIS — E1122 Type 2 diabetes mellitus with diabetic chronic kidney disease: Secondary | ICD-10-CM | POA: Diagnosis not present

## 2018-04-18 DIAGNOSIS — M6281 Muscle weakness (generalized): Secondary | ICD-10-CM | POA: Diagnosis not present

## 2018-04-18 DIAGNOSIS — E1122 Type 2 diabetes mellitus with diabetic chronic kidney disease: Secondary | ICD-10-CM | POA: Diagnosis not present

## 2018-04-18 DIAGNOSIS — N185 Chronic kidney disease, stage 5: Secondary | ICD-10-CM | POA: Diagnosis not present

## 2018-04-18 DIAGNOSIS — E039 Hypothyroidism, unspecified: Secondary | ICD-10-CM | POA: Diagnosis not present

## 2018-04-18 DIAGNOSIS — I120 Hypertensive chronic kidney disease with stage 5 chronic kidney disease or end stage renal disease: Secondary | ICD-10-CM | POA: Diagnosis not present

## 2018-04-18 DIAGNOSIS — D631 Anemia in chronic kidney disease: Secondary | ICD-10-CM | POA: Diagnosis not present

## 2018-04-20 DIAGNOSIS — M6281 Muscle weakness (generalized): Secondary | ICD-10-CM | POA: Diagnosis not present

## 2018-04-20 DIAGNOSIS — I120 Hypertensive chronic kidney disease with stage 5 chronic kidney disease or end stage renal disease: Secondary | ICD-10-CM | POA: Diagnosis not present

## 2018-04-20 DIAGNOSIS — E559 Vitamin D deficiency, unspecified: Secondary | ICD-10-CM | POA: Diagnosis not present

## 2018-04-20 DIAGNOSIS — Z79899 Other long term (current) drug therapy: Secondary | ICD-10-CM | POA: Diagnosis not present

## 2018-04-20 DIAGNOSIS — E039 Hypothyroidism, unspecified: Secondary | ICD-10-CM | POA: Diagnosis not present

## 2018-04-20 DIAGNOSIS — D631 Anemia in chronic kidney disease: Secondary | ICD-10-CM | POA: Diagnosis not present

## 2018-04-20 DIAGNOSIS — D509 Iron deficiency anemia, unspecified: Secondary | ICD-10-CM | POA: Diagnosis not present

## 2018-04-20 DIAGNOSIS — R809 Proteinuria, unspecified: Secondary | ICD-10-CM | POA: Diagnosis not present

## 2018-04-20 DIAGNOSIS — N185 Chronic kidney disease, stage 5: Secondary | ICD-10-CM | POA: Diagnosis not present

## 2018-04-20 DIAGNOSIS — E1122 Type 2 diabetes mellitus with diabetic chronic kidney disease: Secondary | ICD-10-CM | POA: Diagnosis not present

## 2018-04-20 DIAGNOSIS — N183 Chronic kidney disease, stage 3 (moderate): Secondary | ICD-10-CM | POA: Diagnosis not present

## 2018-04-25 DIAGNOSIS — I120 Hypertensive chronic kidney disease with stage 5 chronic kidney disease or end stage renal disease: Secondary | ICD-10-CM | POA: Diagnosis not present

## 2018-04-25 DIAGNOSIS — N185 Chronic kidney disease, stage 5: Secondary | ICD-10-CM | POA: Diagnosis not present

## 2018-04-25 DIAGNOSIS — E1122 Type 2 diabetes mellitus with diabetic chronic kidney disease: Secondary | ICD-10-CM | POA: Diagnosis not present

## 2018-04-25 DIAGNOSIS — M6281 Muscle weakness (generalized): Secondary | ICD-10-CM | POA: Diagnosis not present

## 2018-04-25 DIAGNOSIS — D631 Anemia in chronic kidney disease: Secondary | ICD-10-CM | POA: Diagnosis not present

## 2018-04-25 DIAGNOSIS — E039 Hypothyroidism, unspecified: Secondary | ICD-10-CM | POA: Diagnosis not present

## 2018-04-27 DIAGNOSIS — N185 Chronic kidney disease, stage 5: Secondary | ICD-10-CM | POA: Diagnosis not present

## 2018-04-27 DIAGNOSIS — D631 Anemia in chronic kidney disease: Secondary | ICD-10-CM | POA: Diagnosis not present

## 2018-04-27 DIAGNOSIS — M6281 Muscle weakness (generalized): Secondary | ICD-10-CM | POA: Diagnosis not present

## 2018-04-27 DIAGNOSIS — I120 Hypertensive chronic kidney disease with stage 5 chronic kidney disease or end stage renal disease: Secondary | ICD-10-CM | POA: Diagnosis not present

## 2018-04-27 DIAGNOSIS — E039 Hypothyroidism, unspecified: Secondary | ICD-10-CM | POA: Diagnosis not present

## 2018-04-27 DIAGNOSIS — E1122 Type 2 diabetes mellitus with diabetic chronic kidney disease: Secondary | ICD-10-CM | POA: Diagnosis not present

## 2018-05-02 DIAGNOSIS — E039 Hypothyroidism, unspecified: Secondary | ICD-10-CM | POA: Diagnosis not present

## 2018-05-02 DIAGNOSIS — I120 Hypertensive chronic kidney disease with stage 5 chronic kidney disease or end stage renal disease: Secondary | ICD-10-CM | POA: Diagnosis not present

## 2018-05-02 DIAGNOSIS — M6281 Muscle weakness (generalized): Secondary | ICD-10-CM | POA: Diagnosis not present

## 2018-05-02 DIAGNOSIS — D631 Anemia in chronic kidney disease: Secondary | ICD-10-CM | POA: Diagnosis not present

## 2018-05-02 DIAGNOSIS — N185 Chronic kidney disease, stage 5: Secondary | ICD-10-CM | POA: Diagnosis not present

## 2018-05-02 DIAGNOSIS — E1122 Type 2 diabetes mellitus with diabetic chronic kidney disease: Secondary | ICD-10-CM | POA: Diagnosis not present

## 2018-05-03 DIAGNOSIS — N390 Urinary tract infection, site not specified: Secondary | ICD-10-CM | POA: Diagnosis not present

## 2018-05-03 DIAGNOSIS — E1165 Type 2 diabetes mellitus with hyperglycemia: Secondary | ICD-10-CM | POA: Diagnosis not present

## 2018-05-03 DIAGNOSIS — I5030 Unspecified diastolic (congestive) heart failure: Secondary | ICD-10-CM | POA: Diagnosis not present

## 2018-05-03 DIAGNOSIS — M169 Osteoarthritis of hip, unspecified: Secondary | ICD-10-CM | POA: Diagnosis not present

## 2018-05-03 DIAGNOSIS — Z6829 Body mass index (BMI) 29.0-29.9, adult: Secondary | ICD-10-CM | POA: Diagnosis not present

## 2018-05-03 DIAGNOSIS — Z299 Encounter for prophylactic measures, unspecified: Secondary | ICD-10-CM | POA: Diagnosis not present

## 2018-05-04 DIAGNOSIS — E039 Hypothyroidism, unspecified: Secondary | ICD-10-CM | POA: Diagnosis not present

## 2018-05-04 DIAGNOSIS — E1122 Type 2 diabetes mellitus with diabetic chronic kidney disease: Secondary | ICD-10-CM | POA: Diagnosis not present

## 2018-05-04 DIAGNOSIS — N185 Chronic kidney disease, stage 5: Secondary | ICD-10-CM | POA: Diagnosis not present

## 2018-05-04 DIAGNOSIS — R809 Proteinuria, unspecified: Secondary | ICD-10-CM | POA: Diagnosis not present

## 2018-05-04 DIAGNOSIS — M6281 Muscle weakness (generalized): Secondary | ICD-10-CM | POA: Diagnosis not present

## 2018-05-04 DIAGNOSIS — Z79899 Other long term (current) drug therapy: Secondary | ICD-10-CM | POA: Diagnosis not present

## 2018-05-04 DIAGNOSIS — N183 Chronic kidney disease, stage 3 (moderate): Secondary | ICD-10-CM | POA: Diagnosis not present

## 2018-05-04 DIAGNOSIS — I120 Hypertensive chronic kidney disease with stage 5 chronic kidney disease or end stage renal disease: Secondary | ICD-10-CM | POA: Diagnosis not present

## 2018-05-04 DIAGNOSIS — E559 Vitamin D deficiency, unspecified: Secondary | ICD-10-CM | POA: Diagnosis not present

## 2018-05-04 DIAGNOSIS — D631 Anemia in chronic kidney disease: Secondary | ICD-10-CM | POA: Diagnosis not present

## 2018-05-18 DIAGNOSIS — E559 Vitamin D deficiency, unspecified: Secondary | ICD-10-CM | POA: Diagnosis not present

## 2018-05-18 DIAGNOSIS — D631 Anemia in chronic kidney disease: Secondary | ICD-10-CM | POA: Diagnosis not present

## 2018-05-18 DIAGNOSIS — N183 Chronic kidney disease, stage 3 (moderate): Secondary | ICD-10-CM | POA: Diagnosis not present

## 2018-05-18 DIAGNOSIS — Z79899 Other long term (current) drug therapy: Secondary | ICD-10-CM | POA: Diagnosis not present

## 2018-05-18 DIAGNOSIS — R809 Proteinuria, unspecified: Secondary | ICD-10-CM | POA: Diagnosis not present

## 2018-05-25 DIAGNOSIS — Z299 Encounter for prophylactic measures, unspecified: Secondary | ICD-10-CM | POA: Diagnosis not present

## 2018-05-25 DIAGNOSIS — N184 Chronic kidney disease, stage 4 (severe): Secondary | ICD-10-CM | POA: Diagnosis not present

## 2018-05-25 DIAGNOSIS — Z6829 Body mass index (BMI) 29.0-29.9, adult: Secondary | ICD-10-CM | POA: Diagnosis not present

## 2018-05-25 DIAGNOSIS — E1165 Type 2 diabetes mellitus with hyperglycemia: Secondary | ICD-10-CM | POA: Diagnosis not present

## 2018-05-25 DIAGNOSIS — E1122 Type 2 diabetes mellitus with diabetic chronic kidney disease: Secondary | ICD-10-CM | POA: Diagnosis not present

## 2018-06-01 DIAGNOSIS — E559 Vitamin D deficiency, unspecified: Secondary | ICD-10-CM | POA: Diagnosis not present

## 2018-06-01 DIAGNOSIS — Z79899 Other long term (current) drug therapy: Secondary | ICD-10-CM | POA: Diagnosis not present

## 2018-06-01 DIAGNOSIS — N183 Chronic kidney disease, stage 3 (moderate): Secondary | ICD-10-CM | POA: Diagnosis not present

## 2018-06-01 DIAGNOSIS — E1151 Type 2 diabetes mellitus with diabetic peripheral angiopathy without gangrene: Secondary | ICD-10-CM | POA: Diagnosis not present

## 2018-06-01 DIAGNOSIS — E1140 Type 2 diabetes mellitus with diabetic neuropathy, unspecified: Secondary | ICD-10-CM | POA: Diagnosis not present

## 2018-06-01 DIAGNOSIS — R809 Proteinuria, unspecified: Secondary | ICD-10-CM | POA: Diagnosis not present

## 2018-06-01 DIAGNOSIS — D631 Anemia in chronic kidney disease: Secondary | ICD-10-CM | POA: Diagnosis not present

## 2018-06-13 DIAGNOSIS — D638 Anemia in other chronic diseases classified elsewhere: Secondary | ICD-10-CM | POA: Diagnosis not present

## 2018-06-13 DIAGNOSIS — I10 Essential (primary) hypertension: Secondary | ICD-10-CM | POA: Diagnosis not present

## 2018-06-13 DIAGNOSIS — I509 Heart failure, unspecified: Secondary | ICD-10-CM | POA: Diagnosis not present

## 2018-06-13 DIAGNOSIS — N185 Chronic kidney disease, stage 5: Secondary | ICD-10-CM | POA: Diagnosis not present

## 2018-06-15 DIAGNOSIS — N183 Chronic kidney disease, stage 3 (moderate): Secondary | ICD-10-CM | POA: Diagnosis not present

## 2018-06-15 DIAGNOSIS — D509 Iron deficiency anemia, unspecified: Secondary | ICD-10-CM | POA: Diagnosis not present

## 2018-06-15 DIAGNOSIS — D631 Anemia in chronic kidney disease: Secondary | ICD-10-CM | POA: Diagnosis not present

## 2018-06-15 DIAGNOSIS — R809 Proteinuria, unspecified: Secondary | ICD-10-CM | POA: Diagnosis not present

## 2018-06-15 DIAGNOSIS — Z79899 Other long term (current) drug therapy: Secondary | ICD-10-CM | POA: Diagnosis not present

## 2018-06-15 DIAGNOSIS — E559 Vitamin D deficiency, unspecified: Secondary | ICD-10-CM | POA: Diagnosis not present

## 2018-06-29 DIAGNOSIS — N183 Chronic kidney disease, stage 3 (moderate): Secondary | ICD-10-CM | POA: Diagnosis not present

## 2018-06-29 DIAGNOSIS — D631 Anemia in chronic kidney disease: Secondary | ICD-10-CM | POA: Diagnosis not present

## 2018-06-29 DIAGNOSIS — Z79899 Other long term (current) drug therapy: Secondary | ICD-10-CM | POA: Diagnosis not present

## 2018-06-29 DIAGNOSIS — E559 Vitamin D deficiency, unspecified: Secondary | ICD-10-CM | POA: Diagnosis not present

## 2018-06-29 DIAGNOSIS — D509 Iron deficiency anemia, unspecified: Secondary | ICD-10-CM | POA: Diagnosis not present

## 2018-06-29 DIAGNOSIS — R809 Proteinuria, unspecified: Secondary | ICD-10-CM | POA: Diagnosis not present

## 2018-06-30 DIAGNOSIS — R918 Other nonspecific abnormal finding of lung field: Secondary | ICD-10-CM | POA: Diagnosis not present

## 2018-06-30 DIAGNOSIS — N3001 Acute cystitis with hematuria: Secondary | ICD-10-CM | POA: Diagnosis not present

## 2018-06-30 DIAGNOSIS — R1111 Vomiting without nausea: Secondary | ICD-10-CM | POA: Diagnosis not present

## 2018-06-30 DIAGNOSIS — R0902 Hypoxemia: Secondary | ICD-10-CM | POA: Diagnosis not present

## 2018-06-30 DIAGNOSIS — R531 Weakness: Secondary | ICD-10-CM | POA: Diagnosis not present

## 2018-06-30 DIAGNOSIS — R110 Nausea: Secondary | ICD-10-CM | POA: Diagnosis not present

## 2018-06-30 DIAGNOSIS — Z8546 Personal history of malignant neoplasm of prostate: Secondary | ICD-10-CM | POA: Diagnosis not present

## 2018-06-30 DIAGNOSIS — Z8673 Personal history of transient ischemic attack (TIA), and cerebral infarction without residual deficits: Secondary | ICD-10-CM | POA: Diagnosis not present

## 2018-06-30 DIAGNOSIS — E119 Type 2 diabetes mellitus without complications: Secondary | ICD-10-CM | POA: Diagnosis not present

## 2018-06-30 DIAGNOSIS — E039 Hypothyroidism, unspecified: Secondary | ICD-10-CM | POA: Diagnosis not present

## 2018-07-13 DIAGNOSIS — N185 Chronic kidney disease, stage 5: Secondary | ICD-10-CM | POA: Diagnosis not present

## 2018-07-13 DIAGNOSIS — D509 Iron deficiency anemia, unspecified: Secondary | ICD-10-CM | POA: Diagnosis not present

## 2018-07-13 DIAGNOSIS — R809 Proteinuria, unspecified: Secondary | ICD-10-CM | POA: Diagnosis not present

## 2018-07-13 DIAGNOSIS — E559 Vitamin D deficiency, unspecified: Secondary | ICD-10-CM | POA: Diagnosis not present

## 2018-07-13 DIAGNOSIS — Z79899 Other long term (current) drug therapy: Secondary | ICD-10-CM | POA: Diagnosis not present

## 2018-07-13 DIAGNOSIS — D631 Anemia in chronic kidney disease: Secondary | ICD-10-CM | POA: Diagnosis not present

## 2018-07-27 DIAGNOSIS — D509 Iron deficiency anemia, unspecified: Secondary | ICD-10-CM | POA: Diagnosis not present

## 2018-07-27 DIAGNOSIS — D631 Anemia in chronic kidney disease: Secondary | ICD-10-CM | POA: Diagnosis not present

## 2018-07-27 DIAGNOSIS — N185 Chronic kidney disease, stage 5: Secondary | ICD-10-CM | POA: Diagnosis not present

## 2018-07-27 DIAGNOSIS — E559 Vitamin D deficiency, unspecified: Secondary | ICD-10-CM | POA: Diagnosis not present

## 2018-07-27 DIAGNOSIS — Z79899 Other long term (current) drug therapy: Secondary | ICD-10-CM | POA: Diagnosis not present

## 2018-07-27 DIAGNOSIS — R809 Proteinuria, unspecified: Secondary | ICD-10-CM | POA: Diagnosis not present

## 2018-08-04 DIAGNOSIS — Z8673 Personal history of transient ischemic attack (TIA), and cerebral infarction without residual deficits: Secondary | ICD-10-CM | POA: Diagnosis not present

## 2018-08-04 DIAGNOSIS — K4090 Unilateral inguinal hernia, without obstruction or gangrene, not specified as recurrent: Secondary | ICD-10-CM | POA: Diagnosis not present

## 2018-08-04 DIAGNOSIS — E039 Hypothyroidism, unspecified: Secondary | ICD-10-CM | POA: Diagnosis not present

## 2018-08-04 DIAGNOSIS — K5900 Constipation, unspecified: Secondary | ICD-10-CM | POA: Diagnosis not present

## 2018-08-04 DIAGNOSIS — Z8546 Personal history of malignant neoplasm of prostate: Secondary | ICD-10-CM | POA: Diagnosis not present

## 2018-08-04 DIAGNOSIS — Z79899 Other long term (current) drug therapy: Secondary | ICD-10-CM | POA: Diagnosis not present

## 2018-08-04 DIAGNOSIS — I700 Atherosclerosis of aorta: Secondary | ICD-10-CM | POA: Diagnosis not present

## 2018-08-04 DIAGNOSIS — N189 Chronic kidney disease, unspecified: Secondary | ICD-10-CM | POA: Diagnosis not present

## 2018-08-04 DIAGNOSIS — R1031 Right lower quadrant pain: Secondary | ICD-10-CM | POA: Diagnosis not present

## 2018-08-04 DIAGNOSIS — R109 Unspecified abdominal pain: Secondary | ICD-10-CM | POA: Diagnosis not present

## 2018-08-04 DIAGNOSIS — E1122 Type 2 diabetes mellitus with diabetic chronic kidney disease: Secondary | ICD-10-CM | POA: Diagnosis not present

## 2018-08-10 DIAGNOSIS — D631 Anemia in chronic kidney disease: Secondary | ICD-10-CM | POA: Diagnosis not present

## 2018-08-10 DIAGNOSIS — D509 Iron deficiency anemia, unspecified: Secondary | ICD-10-CM | POA: Diagnosis not present

## 2018-08-10 DIAGNOSIS — Z79899 Other long term (current) drug therapy: Secondary | ICD-10-CM | POA: Diagnosis not present

## 2018-08-10 DIAGNOSIS — N185 Chronic kidney disease, stage 5: Secondary | ICD-10-CM | POA: Diagnosis not present

## 2018-08-10 DIAGNOSIS — E559 Vitamin D deficiency, unspecified: Secondary | ICD-10-CM | POA: Diagnosis not present

## 2018-08-10 DIAGNOSIS — R809 Proteinuria, unspecified: Secondary | ICD-10-CM | POA: Diagnosis not present

## 2018-08-23 DIAGNOSIS — E1140 Type 2 diabetes mellitus with diabetic neuropathy, unspecified: Secondary | ICD-10-CM | POA: Diagnosis not present

## 2018-08-23 DIAGNOSIS — E1151 Type 2 diabetes mellitus with diabetic peripheral angiopathy without gangrene: Secondary | ICD-10-CM | POA: Diagnosis not present

## 2018-08-24 DIAGNOSIS — N185 Chronic kidney disease, stage 5: Secondary | ICD-10-CM | POA: Diagnosis not present

## 2018-08-24 DIAGNOSIS — R809 Proteinuria, unspecified: Secondary | ICD-10-CM | POA: Diagnosis not present

## 2018-08-24 DIAGNOSIS — D509 Iron deficiency anemia, unspecified: Secondary | ICD-10-CM | POA: Diagnosis not present

## 2018-08-24 DIAGNOSIS — Z79899 Other long term (current) drug therapy: Secondary | ICD-10-CM | POA: Diagnosis not present

## 2018-08-24 DIAGNOSIS — E559 Vitamin D deficiency, unspecified: Secondary | ICD-10-CM | POA: Diagnosis not present

## 2018-08-24 DIAGNOSIS — D631 Anemia in chronic kidney disease: Secondary | ICD-10-CM | POA: Diagnosis not present

## 2018-09-07 DIAGNOSIS — Z79899 Other long term (current) drug therapy: Secondary | ICD-10-CM | POA: Diagnosis not present

## 2018-09-07 DIAGNOSIS — D509 Iron deficiency anemia, unspecified: Secondary | ICD-10-CM | POA: Diagnosis not present

## 2018-09-07 DIAGNOSIS — E559 Vitamin D deficiency, unspecified: Secondary | ICD-10-CM | POA: Diagnosis not present

## 2018-09-07 DIAGNOSIS — R809 Proteinuria, unspecified: Secondary | ICD-10-CM | POA: Diagnosis not present

## 2018-09-07 DIAGNOSIS — N185 Chronic kidney disease, stage 5: Secondary | ICD-10-CM | POA: Diagnosis not present

## 2018-09-07 DIAGNOSIS — D631 Anemia in chronic kidney disease: Secondary | ICD-10-CM | POA: Diagnosis not present

## 2018-09-12 DIAGNOSIS — D638 Anemia in other chronic diseases classified elsewhere: Secondary | ICD-10-CM | POA: Diagnosis not present

## 2018-09-12 DIAGNOSIS — R809 Proteinuria, unspecified: Secondary | ICD-10-CM | POA: Diagnosis not present

## 2018-09-12 DIAGNOSIS — N185 Chronic kidney disease, stage 5: Secondary | ICD-10-CM | POA: Diagnosis not present

## 2018-09-12 DIAGNOSIS — E1129 Type 2 diabetes mellitus with other diabetic kidney complication: Secondary | ICD-10-CM | POA: Diagnosis not present

## 2018-09-12 DIAGNOSIS — I509 Heart failure, unspecified: Secondary | ICD-10-CM | POA: Diagnosis not present

## 2018-09-12 DIAGNOSIS — E8889 Other specified metabolic disorders: Secondary | ICD-10-CM | POA: Diagnosis not present

## 2018-09-12 DIAGNOSIS — I10 Essential (primary) hypertension: Secondary | ICD-10-CM | POA: Diagnosis not present

## 2018-09-21 DIAGNOSIS — Z79899 Other long term (current) drug therapy: Secondary | ICD-10-CM | POA: Diagnosis not present

## 2018-09-21 DIAGNOSIS — R809 Proteinuria, unspecified: Secondary | ICD-10-CM | POA: Diagnosis not present

## 2018-09-21 DIAGNOSIS — D509 Iron deficiency anemia, unspecified: Secondary | ICD-10-CM | POA: Diagnosis not present

## 2018-09-21 DIAGNOSIS — D631 Anemia in chronic kidney disease: Secondary | ICD-10-CM | POA: Diagnosis not present

## 2018-09-21 DIAGNOSIS — E559 Vitamin D deficiency, unspecified: Secondary | ICD-10-CM | POA: Diagnosis not present

## 2018-09-21 DIAGNOSIS — N185 Chronic kidney disease, stage 5: Secondary | ICD-10-CM | POA: Diagnosis not present

## 2018-10-05 DIAGNOSIS — D509 Iron deficiency anemia, unspecified: Secondary | ICD-10-CM | POA: Diagnosis not present

## 2018-10-05 DIAGNOSIS — R809 Proteinuria, unspecified: Secondary | ICD-10-CM | POA: Diagnosis not present

## 2018-10-05 DIAGNOSIS — N185 Chronic kidney disease, stage 5: Secondary | ICD-10-CM | POA: Diagnosis not present

## 2018-10-05 DIAGNOSIS — E559 Vitamin D deficiency, unspecified: Secondary | ICD-10-CM | POA: Diagnosis not present

## 2018-10-05 DIAGNOSIS — Z79899 Other long term (current) drug therapy: Secondary | ICD-10-CM | POA: Diagnosis not present

## 2018-10-05 DIAGNOSIS — D631 Anemia in chronic kidney disease: Secondary | ICD-10-CM | POA: Diagnosis not present

## 2018-10-19 DIAGNOSIS — Z299 Encounter for prophylactic measures, unspecified: Secondary | ICD-10-CM | POA: Diagnosis not present

## 2018-10-19 DIAGNOSIS — K219 Gastro-esophageal reflux disease without esophagitis: Secondary | ICD-10-CM | POA: Diagnosis not present

## 2018-10-19 DIAGNOSIS — I723 Aneurysm of iliac artery: Secondary | ICD-10-CM | POA: Diagnosis not present

## 2018-10-19 DIAGNOSIS — E1122 Type 2 diabetes mellitus with diabetic chronic kidney disease: Secondary | ICD-10-CM | POA: Diagnosis not present

## 2018-10-19 DIAGNOSIS — Z6829 Body mass index (BMI) 29.0-29.9, adult: Secondary | ICD-10-CM | POA: Diagnosis not present

## 2018-10-19 DIAGNOSIS — N184 Chronic kidney disease, stage 4 (severe): Secondary | ICD-10-CM | POA: Diagnosis not present

## 2018-10-23 DIAGNOSIS — R809 Proteinuria, unspecified: Secondary | ICD-10-CM | POA: Diagnosis not present

## 2018-10-23 DIAGNOSIS — D509 Iron deficiency anemia, unspecified: Secondary | ICD-10-CM | POA: Diagnosis not present

## 2018-10-23 DIAGNOSIS — D631 Anemia in chronic kidney disease: Secondary | ICD-10-CM | POA: Diagnosis not present

## 2018-10-23 DIAGNOSIS — N185 Chronic kidney disease, stage 5: Secondary | ICD-10-CM | POA: Diagnosis not present

## 2018-10-23 DIAGNOSIS — Z79899 Other long term (current) drug therapy: Secondary | ICD-10-CM | POA: Diagnosis not present

## 2018-10-23 DIAGNOSIS — E559 Vitamin D deficiency, unspecified: Secondary | ICD-10-CM | POA: Diagnosis not present

## 2018-10-24 DIAGNOSIS — W19XXXA Unspecified fall, initial encounter: Secondary | ICD-10-CM | POA: Diagnosis not present

## 2018-10-24 DIAGNOSIS — R5381 Other malaise: Secondary | ICD-10-CM | POA: Diagnosis not present

## 2018-11-02 DIAGNOSIS — D631 Anemia in chronic kidney disease: Secondary | ICD-10-CM | POA: Diagnosis not present

## 2018-11-02 DIAGNOSIS — N184 Chronic kidney disease, stage 4 (severe): Secondary | ICD-10-CM | POA: Diagnosis not present

## 2018-11-02 DIAGNOSIS — R809 Proteinuria, unspecified: Secondary | ICD-10-CM | POA: Diagnosis not present

## 2018-11-02 DIAGNOSIS — N185 Chronic kidney disease, stage 5: Secondary | ICD-10-CM | POA: Diagnosis not present

## 2018-11-02 DIAGNOSIS — E1165 Type 2 diabetes mellitus with hyperglycemia: Secondary | ICD-10-CM | POA: Diagnosis not present

## 2018-11-02 DIAGNOSIS — Z6829 Body mass index (BMI) 29.0-29.9, adult: Secondary | ICD-10-CM | POA: Diagnosis not present

## 2018-11-02 DIAGNOSIS — Z79899 Other long term (current) drug therapy: Secondary | ICD-10-CM | POA: Diagnosis not present

## 2018-11-02 DIAGNOSIS — D509 Iron deficiency anemia, unspecified: Secondary | ICD-10-CM | POA: Diagnosis not present

## 2018-11-02 DIAGNOSIS — I5030 Unspecified diastolic (congestive) heart failure: Secondary | ICD-10-CM | POA: Diagnosis not present

## 2018-11-02 DIAGNOSIS — E1122 Type 2 diabetes mellitus with diabetic chronic kidney disease: Secondary | ICD-10-CM | POA: Diagnosis not present

## 2018-11-02 DIAGNOSIS — E559 Vitamin D deficiency, unspecified: Secondary | ICD-10-CM | POA: Diagnosis not present

## 2018-11-02 DIAGNOSIS — Z299 Encounter for prophylactic measures, unspecified: Secondary | ICD-10-CM | POA: Diagnosis not present

## 2018-11-02 DIAGNOSIS — Z789 Other specified health status: Secondary | ICD-10-CM | POA: Diagnosis not present

## 2018-11-15 DIAGNOSIS — E1151 Type 2 diabetes mellitus with diabetic peripheral angiopathy without gangrene: Secondary | ICD-10-CM | POA: Diagnosis not present

## 2018-11-15 DIAGNOSIS — E1140 Type 2 diabetes mellitus with diabetic neuropathy, unspecified: Secondary | ICD-10-CM | POA: Diagnosis not present

## 2018-11-16 DIAGNOSIS — D509 Iron deficiency anemia, unspecified: Secondary | ICD-10-CM | POA: Diagnosis not present

## 2018-11-16 DIAGNOSIS — R809 Proteinuria, unspecified: Secondary | ICD-10-CM | POA: Diagnosis not present

## 2018-11-16 DIAGNOSIS — N185 Chronic kidney disease, stage 5: Secondary | ICD-10-CM | POA: Diagnosis not present

## 2018-11-16 DIAGNOSIS — Z79899 Other long term (current) drug therapy: Secondary | ICD-10-CM | POA: Diagnosis not present

## 2018-11-16 DIAGNOSIS — E559 Vitamin D deficiency, unspecified: Secondary | ICD-10-CM | POA: Diagnosis not present

## 2018-11-16 DIAGNOSIS — D631 Anemia in chronic kidney disease: Secondary | ICD-10-CM | POA: Diagnosis not present

## 2018-11-30 DIAGNOSIS — D631 Anemia in chronic kidney disease: Secondary | ICD-10-CM | POA: Diagnosis not present

## 2018-11-30 DIAGNOSIS — Z79899 Other long term (current) drug therapy: Secondary | ICD-10-CM | POA: Diagnosis not present

## 2018-11-30 DIAGNOSIS — D509 Iron deficiency anemia, unspecified: Secondary | ICD-10-CM | POA: Diagnosis not present

## 2018-11-30 DIAGNOSIS — E559 Vitamin D deficiency, unspecified: Secondary | ICD-10-CM | POA: Diagnosis not present

## 2018-11-30 DIAGNOSIS — N185 Chronic kidney disease, stage 5: Secondary | ICD-10-CM | POA: Diagnosis not present

## 2018-11-30 DIAGNOSIS — R809 Proteinuria, unspecified: Secondary | ICD-10-CM | POA: Diagnosis not present

## 2018-12-12 DIAGNOSIS — D638 Anemia in other chronic diseases classified elsewhere: Secondary | ICD-10-CM | POA: Diagnosis not present

## 2018-12-12 DIAGNOSIS — I509 Heart failure, unspecified: Secondary | ICD-10-CM | POA: Diagnosis not present

## 2018-12-12 DIAGNOSIS — I10 Essential (primary) hypertension: Secondary | ICD-10-CM | POA: Diagnosis not present

## 2018-12-12 DIAGNOSIS — E1129 Type 2 diabetes mellitus with other diabetic kidney complication: Secondary | ICD-10-CM | POA: Diagnosis not present

## 2018-12-12 DIAGNOSIS — R809 Proteinuria, unspecified: Secondary | ICD-10-CM | POA: Diagnosis not present

## 2018-12-12 DIAGNOSIS — E8889 Other specified metabolic disorders: Secondary | ICD-10-CM | POA: Diagnosis not present

## 2018-12-12 DIAGNOSIS — N185 Chronic kidney disease, stage 5: Secondary | ICD-10-CM | POA: Diagnosis not present

## 2018-12-14 DIAGNOSIS — D631 Anemia in chronic kidney disease: Secondary | ICD-10-CM | POA: Diagnosis not present

## 2018-12-14 DIAGNOSIS — E559 Vitamin D deficiency, unspecified: Secondary | ICD-10-CM | POA: Diagnosis not present

## 2018-12-14 DIAGNOSIS — R809 Proteinuria, unspecified: Secondary | ICD-10-CM | POA: Diagnosis not present

## 2018-12-14 DIAGNOSIS — N185 Chronic kidney disease, stage 5: Secondary | ICD-10-CM | POA: Diagnosis not present

## 2018-12-14 DIAGNOSIS — Z79899 Other long term (current) drug therapy: Secondary | ICD-10-CM | POA: Diagnosis not present

## 2018-12-15 DIAGNOSIS — Z299 Encounter for prophylactic measures, unspecified: Secondary | ICD-10-CM | POA: Diagnosis not present

## 2018-12-15 DIAGNOSIS — R0602 Shortness of breath: Secondary | ICD-10-CM | POA: Diagnosis not present

## 2018-12-15 DIAGNOSIS — E1122 Type 2 diabetes mellitus with diabetic chronic kidney disease: Secondary | ICD-10-CM | POA: Diagnosis not present

## 2018-12-15 DIAGNOSIS — Z713 Dietary counseling and surveillance: Secondary | ICD-10-CM | POA: Diagnosis not present

## 2018-12-19 DIAGNOSIS — N184 Chronic kidney disease, stage 4 (severe): Secondary | ICD-10-CM | POA: Diagnosis not present

## 2018-12-19 DIAGNOSIS — E1165 Type 2 diabetes mellitus with hyperglycemia: Secondary | ICD-10-CM | POA: Diagnosis not present

## 2018-12-19 DIAGNOSIS — E1122 Type 2 diabetes mellitus with diabetic chronic kidney disease: Secondary | ICD-10-CM | POA: Diagnosis not present

## 2018-12-19 DIAGNOSIS — Z6829 Body mass index (BMI) 29.0-29.9, adult: Secondary | ICD-10-CM | POA: Diagnosis not present

## 2018-12-19 DIAGNOSIS — Z299 Encounter for prophylactic measures, unspecified: Secondary | ICD-10-CM | POA: Diagnosis not present

## 2018-12-19 DIAGNOSIS — R0602 Shortness of breath: Secondary | ICD-10-CM | POA: Diagnosis not present

## 2018-12-28 DIAGNOSIS — D631 Anemia in chronic kidney disease: Secondary | ICD-10-CM | POA: Diagnosis not present

## 2018-12-28 DIAGNOSIS — N185 Chronic kidney disease, stage 5: Secondary | ICD-10-CM | POA: Diagnosis not present

## 2018-12-28 DIAGNOSIS — E559 Vitamin D deficiency, unspecified: Secondary | ICD-10-CM | POA: Diagnosis not present

## 2018-12-28 DIAGNOSIS — Z79899 Other long term (current) drug therapy: Secondary | ICD-10-CM | POA: Diagnosis not present

## 2018-12-28 DIAGNOSIS — R809 Proteinuria, unspecified: Secondary | ICD-10-CM | POA: Diagnosis not present

## 2019-01-01 DIAGNOSIS — R0602 Shortness of breath: Secondary | ICD-10-CM | POA: Diagnosis not present

## 2019-01-11 DIAGNOSIS — D509 Iron deficiency anemia, unspecified: Secondary | ICD-10-CM | POA: Diagnosis not present

## 2019-01-11 DIAGNOSIS — R809 Proteinuria, unspecified: Secondary | ICD-10-CM | POA: Diagnosis not present

## 2019-01-11 DIAGNOSIS — Z79899 Other long term (current) drug therapy: Secondary | ICD-10-CM | POA: Diagnosis not present

## 2019-01-11 DIAGNOSIS — E559 Vitamin D deficiency, unspecified: Secondary | ICD-10-CM | POA: Diagnosis not present

## 2019-01-11 DIAGNOSIS — N185 Chronic kidney disease, stage 5: Secondary | ICD-10-CM | POA: Diagnosis not present

## 2019-01-11 DIAGNOSIS — D631 Anemia in chronic kidney disease: Secondary | ICD-10-CM | POA: Diagnosis not present

## 2019-01-25 DIAGNOSIS — D509 Iron deficiency anemia, unspecified: Secondary | ICD-10-CM | POA: Diagnosis not present

## 2019-01-25 DIAGNOSIS — Z79899 Other long term (current) drug therapy: Secondary | ICD-10-CM | POA: Diagnosis not present

## 2019-01-25 DIAGNOSIS — E559 Vitamin D deficiency, unspecified: Secondary | ICD-10-CM | POA: Diagnosis not present

## 2019-01-25 DIAGNOSIS — N185 Chronic kidney disease, stage 5: Secondary | ICD-10-CM | POA: Diagnosis not present

## 2019-01-25 DIAGNOSIS — D631 Anemia in chronic kidney disease: Secondary | ICD-10-CM | POA: Diagnosis not present

## 2019-01-25 DIAGNOSIS — R809 Proteinuria, unspecified: Secondary | ICD-10-CM | POA: Diagnosis not present

## 2019-02-08 DIAGNOSIS — D631 Anemia in chronic kidney disease: Secondary | ICD-10-CM | POA: Diagnosis not present

## 2019-02-08 DIAGNOSIS — Z79899 Other long term (current) drug therapy: Secondary | ICD-10-CM | POA: Diagnosis not present

## 2019-02-08 DIAGNOSIS — Z6829 Body mass index (BMI) 29.0-29.9, adult: Secondary | ICD-10-CM | POA: Diagnosis not present

## 2019-02-08 DIAGNOSIS — E1122 Type 2 diabetes mellitus with diabetic chronic kidney disease: Secondary | ICD-10-CM | POA: Diagnosis not present

## 2019-02-08 DIAGNOSIS — G629 Polyneuropathy, unspecified: Secondary | ICD-10-CM | POA: Diagnosis not present

## 2019-02-08 DIAGNOSIS — Z299 Encounter for prophylactic measures, unspecified: Secondary | ICD-10-CM | POA: Diagnosis not present

## 2019-02-08 DIAGNOSIS — D509 Iron deficiency anemia, unspecified: Secondary | ICD-10-CM | POA: Diagnosis not present

## 2019-02-08 DIAGNOSIS — N184 Chronic kidney disease, stage 4 (severe): Secondary | ICD-10-CM | POA: Diagnosis not present

## 2019-02-08 DIAGNOSIS — R809 Proteinuria, unspecified: Secondary | ICD-10-CM | POA: Diagnosis not present

## 2019-02-08 DIAGNOSIS — E1165 Type 2 diabetes mellitus with hyperglycemia: Secondary | ICD-10-CM | POA: Diagnosis not present

## 2019-02-08 DIAGNOSIS — E559 Vitamin D deficiency, unspecified: Secondary | ICD-10-CM | POA: Diagnosis not present

## 2019-02-08 DIAGNOSIS — N185 Chronic kidney disease, stage 5: Secondary | ICD-10-CM | POA: Diagnosis not present

## 2019-02-18 DIAGNOSIS — Z8546 Personal history of malignant neoplasm of prostate: Secondary | ICD-10-CM | POA: Diagnosis not present

## 2019-02-18 DIAGNOSIS — M4727 Other spondylosis with radiculopathy, lumbosacral region: Secondary | ICD-10-CM | POA: Diagnosis not present

## 2019-02-18 DIAGNOSIS — I440 Atrioventricular block, first degree: Secondary | ICD-10-CM | POA: Diagnosis not present

## 2019-02-18 DIAGNOSIS — N189 Chronic kidney disease, unspecified: Secondary | ICD-10-CM | POA: Diagnosis not present

## 2019-02-18 DIAGNOSIS — I132 Hypertensive heart and chronic kidney disease with heart failure and with stage 5 chronic kidney disease, or end stage renal disease: Secondary | ICD-10-CM | POA: Diagnosis not present

## 2019-02-18 DIAGNOSIS — D631 Anemia in chronic kidney disease: Secondary | ICD-10-CM | POA: Diagnosis present

## 2019-02-18 DIAGNOSIS — M109 Gout, unspecified: Secondary | ICD-10-CM | POA: Diagnosis present

## 2019-02-18 DIAGNOSIS — E8770 Fluid overload, unspecified: Secondary | ICD-10-CM | POA: Diagnosis not present

## 2019-02-18 DIAGNOSIS — N182 Chronic kidney disease, stage 2 (mild): Secondary | ICD-10-CM | POA: Diagnosis not present

## 2019-02-18 DIAGNOSIS — E039 Hypothyroidism, unspecified: Secondary | ICD-10-CM | POA: Diagnosis present

## 2019-02-18 DIAGNOSIS — E1122 Type 2 diabetes mellitus with diabetic chronic kidney disease: Secondary | ICD-10-CM | POA: Diagnosis present

## 2019-02-18 DIAGNOSIS — Z8673 Personal history of transient ischemic attack (TIA), and cerebral infarction without residual deficits: Secondary | ICD-10-CM | POA: Diagnosis not present

## 2019-02-18 DIAGNOSIS — Z66 Do not resuscitate: Secondary | ICD-10-CM | POA: Diagnosis present

## 2019-02-18 DIAGNOSIS — M533 Sacrococcygeal disorders, not elsewhere classified: Secondary | ICD-10-CM | POA: Diagnosis not present

## 2019-02-18 DIAGNOSIS — M25512 Pain in left shoulder: Secondary | ICD-10-CM | POA: Diagnosis present

## 2019-02-18 DIAGNOSIS — N179 Acute kidney failure, unspecified: Secondary | ICD-10-CM | POA: Diagnosis present

## 2019-02-18 DIAGNOSIS — I509 Heart failure, unspecified: Secondary | ICD-10-CM | POA: Diagnosis not present

## 2019-02-18 DIAGNOSIS — R0602 Shortness of breath: Secondary | ICD-10-CM | POA: Diagnosis not present

## 2019-02-18 DIAGNOSIS — I5033 Acute on chronic diastolic (congestive) heart failure: Secondary | ICD-10-CM | POA: Diagnosis not present

## 2019-02-18 DIAGNOSIS — R748 Abnormal levels of other serum enzymes: Secondary | ICD-10-CM | POA: Diagnosis not present

## 2019-02-18 DIAGNOSIS — E872 Acidosis: Secondary | ICD-10-CM | POA: Diagnosis present

## 2019-02-18 DIAGNOSIS — N185 Chronic kidney disease, stage 5: Secondary | ICD-10-CM | POA: Diagnosis not present

## 2019-03-01 DIAGNOSIS — E559 Vitamin D deficiency, unspecified: Secondary | ICD-10-CM | POA: Diagnosis not present

## 2019-03-01 DIAGNOSIS — D631 Anemia in chronic kidney disease: Secondary | ICD-10-CM | POA: Diagnosis not present

## 2019-03-01 DIAGNOSIS — N184 Chronic kidney disease, stage 4 (severe): Secondary | ICD-10-CM | POA: Diagnosis not present

## 2019-03-01 DIAGNOSIS — N185 Chronic kidney disease, stage 5: Secondary | ICD-10-CM | POA: Diagnosis not present

## 2019-03-01 DIAGNOSIS — I723 Aneurysm of iliac artery: Secondary | ICD-10-CM | POA: Diagnosis not present

## 2019-03-01 DIAGNOSIS — Z299 Encounter for prophylactic measures, unspecified: Secondary | ICD-10-CM | POA: Diagnosis not present

## 2019-03-01 DIAGNOSIS — R809 Proteinuria, unspecified: Secondary | ICD-10-CM | POA: Diagnosis not present

## 2019-03-01 DIAGNOSIS — D509 Iron deficiency anemia, unspecified: Secondary | ICD-10-CM | POA: Diagnosis not present

## 2019-03-01 DIAGNOSIS — Z6829 Body mass index (BMI) 29.0-29.9, adult: Secondary | ICD-10-CM | POA: Diagnosis not present

## 2019-03-01 DIAGNOSIS — E1165 Type 2 diabetes mellitus with hyperglycemia: Secondary | ICD-10-CM | POA: Diagnosis not present

## 2019-03-01 DIAGNOSIS — E1122 Type 2 diabetes mellitus with diabetic chronic kidney disease: Secondary | ICD-10-CM | POA: Diagnosis not present

## 2019-03-01 DIAGNOSIS — Z79899 Other long term (current) drug therapy: Secondary | ICD-10-CM | POA: Diagnosis not present

## 2019-03-07 DIAGNOSIS — Z9181 History of falling: Secondary | ICD-10-CM | POA: Diagnosis not present

## 2019-03-07 DIAGNOSIS — I5033 Acute on chronic diastolic (congestive) heart failure: Secondary | ICD-10-CM | POA: Diagnosis not present

## 2019-03-07 DIAGNOSIS — I130 Hypertensive heart and chronic kidney disease with heart failure and stage 1 through stage 4 chronic kidney disease, or unspecified chronic kidney disease: Secondary | ICD-10-CM | POA: Diagnosis not present

## 2019-03-07 DIAGNOSIS — Z8619 Personal history of other infectious and parasitic diseases: Secondary | ICD-10-CM | POA: Diagnosis not present

## 2019-03-07 DIAGNOSIS — M109 Gout, unspecified: Secondary | ICD-10-CM | POA: Diagnosis not present

## 2019-03-07 DIAGNOSIS — M5417 Radiculopathy, lumbosacral region: Secondary | ICD-10-CM | POA: Diagnosis not present

## 2019-03-07 DIAGNOSIS — M8580 Other specified disorders of bone density and structure, unspecified site: Secondary | ICD-10-CM | POA: Diagnosis not present

## 2019-03-07 DIAGNOSIS — M4316 Spondylolisthesis, lumbar region: Secondary | ICD-10-CM | POA: Diagnosis not present

## 2019-03-07 DIAGNOSIS — N184 Chronic kidney disease, stage 4 (severe): Secondary | ICD-10-CM | POA: Diagnosis not present

## 2019-03-07 DIAGNOSIS — D631 Anemia in chronic kidney disease: Secondary | ICD-10-CM | POA: Diagnosis not present

## 2019-03-07 DIAGNOSIS — L89152 Pressure ulcer of sacral region, stage 2: Secondary | ICD-10-CM | POA: Diagnosis not present

## 2019-03-07 DIAGNOSIS — Z87440 Personal history of urinary (tract) infections: Secondary | ICD-10-CM | POA: Diagnosis not present

## 2019-03-07 DIAGNOSIS — Z8546 Personal history of malignant neoplasm of prostate: Secondary | ICD-10-CM | POA: Diagnosis not present

## 2019-03-07 DIAGNOSIS — E1122 Type 2 diabetes mellitus with diabetic chronic kidney disease: Secondary | ICD-10-CM | POA: Diagnosis not present

## 2019-03-07 DIAGNOSIS — Z7984 Long term (current) use of oral hypoglycemic drugs: Secondary | ICD-10-CM | POA: Diagnosis not present

## 2019-03-09 DIAGNOSIS — L89152 Pressure ulcer of sacral region, stage 2: Secondary | ICD-10-CM | POA: Diagnosis not present

## 2019-03-09 DIAGNOSIS — E1122 Type 2 diabetes mellitus with diabetic chronic kidney disease: Secondary | ICD-10-CM | POA: Diagnosis not present

## 2019-03-09 DIAGNOSIS — I5033 Acute on chronic diastolic (congestive) heart failure: Secondary | ICD-10-CM | POA: Diagnosis not present

## 2019-03-09 DIAGNOSIS — I130 Hypertensive heart and chronic kidney disease with heart failure and stage 1 through stage 4 chronic kidney disease, or unspecified chronic kidney disease: Secondary | ICD-10-CM | POA: Diagnosis not present

## 2019-03-09 DIAGNOSIS — M5417 Radiculopathy, lumbosacral region: Secondary | ICD-10-CM | POA: Diagnosis not present

## 2019-03-09 DIAGNOSIS — M4316 Spondylolisthesis, lumbar region: Secondary | ICD-10-CM | POA: Diagnosis not present

## 2019-03-12 DIAGNOSIS — R55 Syncope and collapse: Secondary | ICD-10-CM | POA: Diagnosis not present

## 2019-03-13 DIAGNOSIS — Z6829 Body mass index (BMI) 29.0-29.9, adult: Secondary | ICD-10-CM | POA: Diagnosis not present

## 2019-03-13 DIAGNOSIS — R300 Dysuria: Secondary | ICD-10-CM | POA: Diagnosis not present

## 2019-03-13 DIAGNOSIS — N184 Chronic kidney disease, stage 4 (severe): Secondary | ICD-10-CM | POA: Diagnosis not present

## 2019-03-13 DIAGNOSIS — Z299 Encounter for prophylactic measures, unspecified: Secondary | ICD-10-CM | POA: Diagnosis not present

## 2019-03-13 DIAGNOSIS — I639 Cerebral infarction, unspecified: Secondary | ICD-10-CM | POA: Diagnosis not present

## 2019-03-13 DIAGNOSIS — E1122 Type 2 diabetes mellitus with diabetic chronic kidney disease: Secondary | ICD-10-CM | POA: Diagnosis not present

## 2019-03-14 DIAGNOSIS — I130 Hypertensive heart and chronic kidney disease with heart failure and stage 1 through stage 4 chronic kidney disease, or unspecified chronic kidney disease: Secondary | ICD-10-CM | POA: Diagnosis not present

## 2019-03-14 DIAGNOSIS — M4316 Spondylolisthesis, lumbar region: Secondary | ICD-10-CM | POA: Diagnosis not present

## 2019-03-14 DIAGNOSIS — I5033 Acute on chronic diastolic (congestive) heart failure: Secondary | ICD-10-CM | POA: Diagnosis not present

## 2019-03-14 DIAGNOSIS — L89152 Pressure ulcer of sacral region, stage 2: Secondary | ICD-10-CM | POA: Diagnosis not present

## 2019-03-14 DIAGNOSIS — M5417 Radiculopathy, lumbosacral region: Secondary | ICD-10-CM | POA: Diagnosis not present

## 2019-03-14 DIAGNOSIS — E1122 Type 2 diabetes mellitus with diabetic chronic kidney disease: Secondary | ICD-10-CM | POA: Diagnosis not present

## 2019-03-15 DIAGNOSIS — E559 Vitamin D deficiency, unspecified: Secondary | ICD-10-CM | POA: Diagnosis not present

## 2019-03-15 DIAGNOSIS — D509 Iron deficiency anemia, unspecified: Secondary | ICD-10-CM | POA: Diagnosis not present

## 2019-03-15 DIAGNOSIS — R809 Proteinuria, unspecified: Secondary | ICD-10-CM | POA: Diagnosis not present

## 2019-03-15 DIAGNOSIS — Z79899 Other long term (current) drug therapy: Secondary | ICD-10-CM | POA: Diagnosis not present

## 2019-03-15 DIAGNOSIS — D631 Anemia in chronic kidney disease: Secondary | ICD-10-CM | POA: Diagnosis not present

## 2019-03-15 DIAGNOSIS — N185 Chronic kidney disease, stage 5: Secondary | ICD-10-CM | POA: Diagnosis not present

## 2019-03-16 DIAGNOSIS — M4316 Spondylolisthesis, lumbar region: Secondary | ICD-10-CM | POA: Diagnosis not present

## 2019-03-16 DIAGNOSIS — I130 Hypertensive heart and chronic kidney disease with heart failure and stage 1 through stage 4 chronic kidney disease, or unspecified chronic kidney disease: Secondary | ICD-10-CM | POA: Diagnosis not present

## 2019-03-16 DIAGNOSIS — I5033 Acute on chronic diastolic (congestive) heart failure: Secondary | ICD-10-CM | POA: Diagnosis not present

## 2019-03-16 DIAGNOSIS — E1122 Type 2 diabetes mellitus with diabetic chronic kidney disease: Secondary | ICD-10-CM | POA: Diagnosis not present

## 2019-03-16 DIAGNOSIS — M5417 Radiculopathy, lumbosacral region: Secondary | ICD-10-CM | POA: Diagnosis not present

## 2019-03-16 DIAGNOSIS — L89152 Pressure ulcer of sacral region, stage 2: Secondary | ICD-10-CM | POA: Diagnosis not present

## 2019-03-19 DIAGNOSIS — M4316 Spondylolisthesis, lumbar region: Secondary | ICD-10-CM | POA: Diagnosis not present

## 2019-03-19 DIAGNOSIS — I130 Hypertensive heart and chronic kidney disease with heart failure and stage 1 through stage 4 chronic kidney disease, or unspecified chronic kidney disease: Secondary | ICD-10-CM | POA: Diagnosis not present

## 2019-03-19 DIAGNOSIS — M5417 Radiculopathy, lumbosacral region: Secondary | ICD-10-CM | POA: Diagnosis not present

## 2019-03-19 DIAGNOSIS — I5033 Acute on chronic diastolic (congestive) heart failure: Secondary | ICD-10-CM | POA: Diagnosis not present

## 2019-03-19 DIAGNOSIS — E1122 Type 2 diabetes mellitus with diabetic chronic kidney disease: Secondary | ICD-10-CM | POA: Diagnosis not present

## 2019-03-19 DIAGNOSIS — L89152 Pressure ulcer of sacral region, stage 2: Secondary | ICD-10-CM | POA: Diagnosis not present

## 2019-03-20 DIAGNOSIS — M5417 Radiculopathy, lumbosacral region: Secondary | ICD-10-CM | POA: Diagnosis not present

## 2019-03-20 DIAGNOSIS — M4316 Spondylolisthesis, lumbar region: Secondary | ICD-10-CM | POA: Diagnosis not present

## 2019-03-20 DIAGNOSIS — E1122 Type 2 diabetes mellitus with diabetic chronic kidney disease: Secondary | ICD-10-CM | POA: Diagnosis not present

## 2019-03-20 DIAGNOSIS — L89152 Pressure ulcer of sacral region, stage 2: Secondary | ICD-10-CM | POA: Diagnosis not present

## 2019-03-20 DIAGNOSIS — I130 Hypertensive heart and chronic kidney disease with heart failure and stage 1 through stage 4 chronic kidney disease, or unspecified chronic kidney disease: Secondary | ICD-10-CM | POA: Diagnosis not present

## 2019-03-20 DIAGNOSIS — I5033 Acute on chronic diastolic (congestive) heart failure: Secondary | ICD-10-CM | POA: Diagnosis not present

## 2019-03-21 DIAGNOSIS — M5417 Radiculopathy, lumbosacral region: Secondary | ICD-10-CM | POA: Diagnosis not present

## 2019-03-21 DIAGNOSIS — L89152 Pressure ulcer of sacral region, stage 2: Secondary | ICD-10-CM | POA: Diagnosis not present

## 2019-03-21 DIAGNOSIS — M4316 Spondylolisthesis, lumbar region: Secondary | ICD-10-CM | POA: Diagnosis not present

## 2019-03-21 DIAGNOSIS — E1122 Type 2 diabetes mellitus with diabetic chronic kidney disease: Secondary | ICD-10-CM | POA: Diagnosis not present

## 2019-03-21 DIAGNOSIS — I5033 Acute on chronic diastolic (congestive) heart failure: Secondary | ICD-10-CM | POA: Diagnosis not present

## 2019-03-21 DIAGNOSIS — I130 Hypertensive heart and chronic kidney disease with heart failure and stage 1 through stage 4 chronic kidney disease, or unspecified chronic kidney disease: Secondary | ICD-10-CM | POA: Diagnosis not present

## 2019-03-26 DIAGNOSIS — E1122 Type 2 diabetes mellitus with diabetic chronic kidney disease: Secondary | ICD-10-CM | POA: Diagnosis not present

## 2019-03-26 DIAGNOSIS — L89152 Pressure ulcer of sacral region, stage 2: Secondary | ICD-10-CM | POA: Diagnosis not present

## 2019-03-26 DIAGNOSIS — M4316 Spondylolisthesis, lumbar region: Secondary | ICD-10-CM | POA: Diagnosis not present

## 2019-03-26 DIAGNOSIS — I130 Hypertensive heart and chronic kidney disease with heart failure and stage 1 through stage 4 chronic kidney disease, or unspecified chronic kidney disease: Secondary | ICD-10-CM | POA: Diagnosis not present

## 2019-03-26 DIAGNOSIS — M5417 Radiculopathy, lumbosacral region: Secondary | ICD-10-CM | POA: Diagnosis not present

## 2019-03-26 DIAGNOSIS — I5033 Acute on chronic diastolic (congestive) heart failure: Secondary | ICD-10-CM | POA: Diagnosis not present

## 2019-03-27 DIAGNOSIS — E1122 Type 2 diabetes mellitus with diabetic chronic kidney disease: Secondary | ICD-10-CM | POA: Diagnosis not present

## 2019-03-27 DIAGNOSIS — M4316 Spondylolisthesis, lumbar region: Secondary | ICD-10-CM | POA: Diagnosis not present

## 2019-03-27 DIAGNOSIS — I130 Hypertensive heart and chronic kidney disease with heart failure and stage 1 through stage 4 chronic kidney disease, or unspecified chronic kidney disease: Secondary | ICD-10-CM | POA: Diagnosis not present

## 2019-03-27 DIAGNOSIS — M5417 Radiculopathy, lumbosacral region: Secondary | ICD-10-CM | POA: Diagnosis not present

## 2019-03-27 DIAGNOSIS — I5033 Acute on chronic diastolic (congestive) heart failure: Secondary | ICD-10-CM | POA: Diagnosis not present

## 2019-03-27 DIAGNOSIS — L89152 Pressure ulcer of sacral region, stage 2: Secondary | ICD-10-CM | POA: Diagnosis not present

## 2019-03-28 DIAGNOSIS — M5417 Radiculopathy, lumbosacral region: Secondary | ICD-10-CM | POA: Diagnosis not present

## 2019-03-28 DIAGNOSIS — I5033 Acute on chronic diastolic (congestive) heart failure: Secondary | ICD-10-CM | POA: Diagnosis not present

## 2019-03-28 DIAGNOSIS — L89152 Pressure ulcer of sacral region, stage 2: Secondary | ICD-10-CM | POA: Diagnosis not present

## 2019-03-28 DIAGNOSIS — I130 Hypertensive heart and chronic kidney disease with heart failure and stage 1 through stage 4 chronic kidney disease, or unspecified chronic kidney disease: Secondary | ICD-10-CM | POA: Diagnosis not present

## 2019-03-28 DIAGNOSIS — E1122 Type 2 diabetes mellitus with diabetic chronic kidney disease: Secondary | ICD-10-CM | POA: Diagnosis not present

## 2019-03-28 DIAGNOSIS — M4316 Spondylolisthesis, lumbar region: Secondary | ICD-10-CM | POA: Diagnosis not present

## 2019-03-29 DIAGNOSIS — Z79899 Other long term (current) drug therapy: Secondary | ICD-10-CM | POA: Diagnosis not present

## 2019-03-29 DIAGNOSIS — R809 Proteinuria, unspecified: Secondary | ICD-10-CM | POA: Diagnosis not present

## 2019-03-29 DIAGNOSIS — D631 Anemia in chronic kidney disease: Secondary | ICD-10-CM | POA: Diagnosis not present

## 2019-03-29 DIAGNOSIS — E559 Vitamin D deficiency, unspecified: Secondary | ICD-10-CM | POA: Diagnosis not present

## 2019-03-29 DIAGNOSIS — D509 Iron deficiency anemia, unspecified: Secondary | ICD-10-CM | POA: Diagnosis not present

## 2019-03-29 DIAGNOSIS — N185 Chronic kidney disease, stage 5: Secondary | ICD-10-CM | POA: Diagnosis not present

## 2019-04-02 DIAGNOSIS — M4316 Spondylolisthesis, lumbar region: Secondary | ICD-10-CM | POA: Diagnosis not present

## 2019-04-02 DIAGNOSIS — M5417 Radiculopathy, lumbosacral region: Secondary | ICD-10-CM | POA: Diagnosis not present

## 2019-04-02 DIAGNOSIS — L89152 Pressure ulcer of sacral region, stage 2: Secondary | ICD-10-CM | POA: Diagnosis not present

## 2019-04-02 DIAGNOSIS — I5033 Acute on chronic diastolic (congestive) heart failure: Secondary | ICD-10-CM | POA: Diagnosis not present

## 2019-04-02 DIAGNOSIS — E1122 Type 2 diabetes mellitus with diabetic chronic kidney disease: Secondary | ICD-10-CM | POA: Diagnosis not present

## 2019-04-02 DIAGNOSIS — I130 Hypertensive heart and chronic kidney disease with heart failure and stage 1 through stage 4 chronic kidney disease, or unspecified chronic kidney disease: Secondary | ICD-10-CM | POA: Diagnosis not present

## 2019-04-03 DIAGNOSIS — L89152 Pressure ulcer of sacral region, stage 2: Secondary | ICD-10-CM | POA: Diagnosis not present

## 2019-04-03 DIAGNOSIS — E1122 Type 2 diabetes mellitus with diabetic chronic kidney disease: Secondary | ICD-10-CM | POA: Diagnosis not present

## 2019-04-03 DIAGNOSIS — M5417 Radiculopathy, lumbosacral region: Secondary | ICD-10-CM | POA: Diagnosis not present

## 2019-04-03 DIAGNOSIS — I5033 Acute on chronic diastolic (congestive) heart failure: Secondary | ICD-10-CM | POA: Diagnosis not present

## 2019-04-03 DIAGNOSIS — M4316 Spondylolisthesis, lumbar region: Secondary | ICD-10-CM | POA: Diagnosis not present

## 2019-04-03 DIAGNOSIS — I130 Hypertensive heart and chronic kidney disease with heart failure and stage 1 through stage 4 chronic kidney disease, or unspecified chronic kidney disease: Secondary | ICD-10-CM | POA: Diagnosis not present

## 2019-04-05 DIAGNOSIS — I5033 Acute on chronic diastolic (congestive) heart failure: Secondary | ICD-10-CM | POA: Diagnosis not present

## 2019-04-05 DIAGNOSIS — L89152 Pressure ulcer of sacral region, stage 2: Secondary | ICD-10-CM | POA: Diagnosis not present

## 2019-04-05 DIAGNOSIS — I130 Hypertensive heart and chronic kidney disease with heart failure and stage 1 through stage 4 chronic kidney disease, or unspecified chronic kidney disease: Secondary | ICD-10-CM | POA: Diagnosis not present

## 2019-04-05 DIAGNOSIS — M4316 Spondylolisthesis, lumbar region: Secondary | ICD-10-CM | POA: Diagnosis not present

## 2019-04-05 DIAGNOSIS — M5417 Radiculopathy, lumbosacral region: Secondary | ICD-10-CM | POA: Diagnosis not present

## 2019-04-05 DIAGNOSIS — E1122 Type 2 diabetes mellitus with diabetic chronic kidney disease: Secondary | ICD-10-CM | POA: Diagnosis not present

## 2019-04-06 DIAGNOSIS — M4316 Spondylolisthesis, lumbar region: Secondary | ICD-10-CM | POA: Diagnosis not present

## 2019-04-06 DIAGNOSIS — N184 Chronic kidney disease, stage 4 (severe): Secondary | ICD-10-CM | POA: Diagnosis not present

## 2019-04-06 DIAGNOSIS — Z87440 Personal history of urinary (tract) infections: Secondary | ICD-10-CM | POA: Diagnosis not present

## 2019-04-06 DIAGNOSIS — Z7984 Long term (current) use of oral hypoglycemic drugs: Secondary | ICD-10-CM | POA: Diagnosis not present

## 2019-04-06 DIAGNOSIS — Z9181 History of falling: Secondary | ICD-10-CM | POA: Diagnosis not present

## 2019-04-06 DIAGNOSIS — M5417 Radiculopathy, lumbosacral region: Secondary | ICD-10-CM | POA: Diagnosis not present

## 2019-04-06 DIAGNOSIS — E1122 Type 2 diabetes mellitus with diabetic chronic kidney disease: Secondary | ICD-10-CM | POA: Diagnosis not present

## 2019-04-06 DIAGNOSIS — L89152 Pressure ulcer of sacral region, stage 2: Secondary | ICD-10-CM | POA: Diagnosis not present

## 2019-04-06 DIAGNOSIS — D631 Anemia in chronic kidney disease: Secondary | ICD-10-CM | POA: Diagnosis not present

## 2019-04-06 DIAGNOSIS — Z8619 Personal history of other infectious and parasitic diseases: Secondary | ICD-10-CM | POA: Diagnosis not present

## 2019-04-06 DIAGNOSIS — Z8546 Personal history of malignant neoplasm of prostate: Secondary | ICD-10-CM | POA: Diagnosis not present

## 2019-04-06 DIAGNOSIS — M109 Gout, unspecified: Secondary | ICD-10-CM | POA: Diagnosis not present

## 2019-04-06 DIAGNOSIS — I130 Hypertensive heart and chronic kidney disease with heart failure and stage 1 through stage 4 chronic kidney disease, or unspecified chronic kidney disease: Secondary | ICD-10-CM | POA: Diagnosis not present

## 2019-04-06 DIAGNOSIS — I5033 Acute on chronic diastolic (congestive) heart failure: Secondary | ICD-10-CM | POA: Diagnosis not present

## 2019-04-06 DIAGNOSIS — M8580 Other specified disorders of bone density and structure, unspecified site: Secondary | ICD-10-CM | POA: Diagnosis not present

## 2019-04-09 DIAGNOSIS — Z6829 Body mass index (BMI) 29.0-29.9, adult: Secondary | ICD-10-CM | POA: Diagnosis not present

## 2019-04-09 DIAGNOSIS — Z299 Encounter for prophylactic measures, unspecified: Secondary | ICD-10-CM | POA: Diagnosis not present

## 2019-04-09 DIAGNOSIS — Z1339 Encounter for screening examination for other mental health and behavioral disorders: Secondary | ICD-10-CM | POA: Diagnosis not present

## 2019-04-09 DIAGNOSIS — C61 Malignant neoplasm of prostate: Secondary | ICD-10-CM | POA: Diagnosis not present

## 2019-04-09 DIAGNOSIS — I351 Nonrheumatic aortic (valve) insufficiency: Secondary | ICD-10-CM | POA: Diagnosis not present

## 2019-04-09 DIAGNOSIS — I723 Aneurysm of iliac artery: Secondary | ICD-10-CM | POA: Diagnosis not present

## 2019-04-09 DIAGNOSIS — Z0000 Encounter for general adult medical examination without abnormal findings: Secondary | ICD-10-CM | POA: Diagnosis not present

## 2019-04-09 DIAGNOSIS — Z1331 Encounter for screening for depression: Secondary | ICD-10-CM | POA: Diagnosis not present

## 2019-04-09 DIAGNOSIS — Z7189 Other specified counseling: Secondary | ICD-10-CM | POA: Diagnosis not present

## 2019-04-10 DIAGNOSIS — E1122 Type 2 diabetes mellitus with diabetic chronic kidney disease: Secondary | ICD-10-CM | POA: Diagnosis not present

## 2019-04-10 DIAGNOSIS — M4316 Spondylolisthesis, lumbar region: Secondary | ICD-10-CM | POA: Diagnosis not present

## 2019-04-10 DIAGNOSIS — I5033 Acute on chronic diastolic (congestive) heart failure: Secondary | ICD-10-CM | POA: Diagnosis not present

## 2019-04-10 DIAGNOSIS — L89152 Pressure ulcer of sacral region, stage 2: Secondary | ICD-10-CM | POA: Diagnosis not present

## 2019-04-10 DIAGNOSIS — M5417 Radiculopathy, lumbosacral region: Secondary | ICD-10-CM | POA: Diagnosis not present

## 2019-04-10 DIAGNOSIS — I130 Hypertensive heart and chronic kidney disease with heart failure and stage 1 through stage 4 chronic kidney disease, or unspecified chronic kidney disease: Secondary | ICD-10-CM | POA: Diagnosis not present

## 2019-04-11 DIAGNOSIS — L89152 Pressure ulcer of sacral region, stage 2: Secondary | ICD-10-CM | POA: Diagnosis not present

## 2019-04-11 DIAGNOSIS — I130 Hypertensive heart and chronic kidney disease with heart failure and stage 1 through stage 4 chronic kidney disease, or unspecified chronic kidney disease: Secondary | ICD-10-CM | POA: Diagnosis not present

## 2019-04-11 DIAGNOSIS — E1122 Type 2 diabetes mellitus with diabetic chronic kidney disease: Secondary | ICD-10-CM | POA: Diagnosis not present

## 2019-04-11 DIAGNOSIS — I5033 Acute on chronic diastolic (congestive) heart failure: Secondary | ICD-10-CM | POA: Diagnosis not present

## 2019-04-11 DIAGNOSIS — M4316 Spondylolisthesis, lumbar region: Secondary | ICD-10-CM | POA: Diagnosis not present

## 2019-04-11 DIAGNOSIS — M5417 Radiculopathy, lumbosacral region: Secondary | ICD-10-CM | POA: Diagnosis not present

## 2019-04-12 DIAGNOSIS — N185 Chronic kidney disease, stage 5: Secondary | ICD-10-CM | POA: Diagnosis not present

## 2019-04-12 DIAGNOSIS — R809 Proteinuria, unspecified: Secondary | ICD-10-CM | POA: Diagnosis not present

## 2019-04-12 DIAGNOSIS — D631 Anemia in chronic kidney disease: Secondary | ICD-10-CM | POA: Diagnosis not present

## 2019-04-12 DIAGNOSIS — D509 Iron deficiency anemia, unspecified: Secondary | ICD-10-CM | POA: Diagnosis not present

## 2019-04-12 DIAGNOSIS — Z79899 Other long term (current) drug therapy: Secondary | ICD-10-CM | POA: Diagnosis not present

## 2019-04-12 DIAGNOSIS — E559 Vitamin D deficiency, unspecified: Secondary | ICD-10-CM | POA: Diagnosis not present

## 2019-04-17 DIAGNOSIS — M5417 Radiculopathy, lumbosacral region: Secondary | ICD-10-CM | POA: Diagnosis not present

## 2019-04-17 DIAGNOSIS — I130 Hypertensive heart and chronic kidney disease with heart failure and stage 1 through stage 4 chronic kidney disease, or unspecified chronic kidney disease: Secondary | ICD-10-CM | POA: Diagnosis not present

## 2019-04-17 DIAGNOSIS — L89152 Pressure ulcer of sacral region, stage 2: Secondary | ICD-10-CM | POA: Diagnosis not present

## 2019-04-17 DIAGNOSIS — E1122 Type 2 diabetes mellitus with diabetic chronic kidney disease: Secondary | ICD-10-CM | POA: Diagnosis not present

## 2019-04-17 DIAGNOSIS — I5033 Acute on chronic diastolic (congestive) heart failure: Secondary | ICD-10-CM | POA: Diagnosis not present

## 2019-04-17 DIAGNOSIS — M4316 Spondylolisthesis, lumbar region: Secondary | ICD-10-CM | POA: Diagnosis not present

## 2019-04-18 DIAGNOSIS — E1140 Type 2 diabetes mellitus with diabetic neuropathy, unspecified: Secondary | ICD-10-CM | POA: Diagnosis not present

## 2019-04-18 DIAGNOSIS — E1151 Type 2 diabetes mellitus with diabetic peripheral angiopathy without gangrene: Secondary | ICD-10-CM | POA: Diagnosis not present

## 2019-04-19 ENCOUNTER — Ambulatory Visit (HOSPITAL_COMMUNITY): Payer: Medicare Other | Admitting: Hematology

## 2019-04-19 DIAGNOSIS — E1122 Type 2 diabetes mellitus with diabetic chronic kidney disease: Secondary | ICD-10-CM | POA: Diagnosis not present

## 2019-04-19 DIAGNOSIS — I130 Hypertensive heart and chronic kidney disease with heart failure and stage 1 through stage 4 chronic kidney disease, or unspecified chronic kidney disease: Secondary | ICD-10-CM | POA: Diagnosis not present

## 2019-04-19 DIAGNOSIS — L89152 Pressure ulcer of sacral region, stage 2: Secondary | ICD-10-CM | POA: Diagnosis not present

## 2019-04-19 DIAGNOSIS — M4316 Spondylolisthesis, lumbar region: Secondary | ICD-10-CM | POA: Diagnosis not present

## 2019-04-19 DIAGNOSIS — M5417 Radiculopathy, lumbosacral region: Secondary | ICD-10-CM | POA: Diagnosis not present

## 2019-04-19 DIAGNOSIS — I5033 Acute on chronic diastolic (congestive) heart failure: Secondary | ICD-10-CM | POA: Diagnosis not present

## 2019-04-23 DIAGNOSIS — L89152 Pressure ulcer of sacral region, stage 2: Secondary | ICD-10-CM | POA: Diagnosis not present

## 2019-04-23 DIAGNOSIS — I130 Hypertensive heart and chronic kidney disease with heart failure and stage 1 through stage 4 chronic kidney disease, or unspecified chronic kidney disease: Secondary | ICD-10-CM | POA: Diagnosis not present

## 2019-04-23 DIAGNOSIS — M5417 Radiculopathy, lumbosacral region: Secondary | ICD-10-CM | POA: Diagnosis not present

## 2019-04-23 DIAGNOSIS — M4316 Spondylolisthesis, lumbar region: Secondary | ICD-10-CM | POA: Diagnosis not present

## 2019-04-23 DIAGNOSIS — E1122 Type 2 diabetes mellitus with diabetic chronic kidney disease: Secondary | ICD-10-CM | POA: Diagnosis not present

## 2019-04-23 DIAGNOSIS — Z23 Encounter for immunization: Secondary | ICD-10-CM | POA: Diagnosis not present

## 2019-04-23 DIAGNOSIS — I5033 Acute on chronic diastolic (congestive) heart failure: Secondary | ICD-10-CM | POA: Diagnosis not present

## 2019-04-24 DIAGNOSIS — I5033 Acute on chronic diastolic (congestive) heart failure: Secondary | ICD-10-CM | POA: Diagnosis not present

## 2019-04-24 DIAGNOSIS — M5417 Radiculopathy, lumbosacral region: Secondary | ICD-10-CM | POA: Diagnosis not present

## 2019-04-24 DIAGNOSIS — I130 Hypertensive heart and chronic kidney disease with heart failure and stage 1 through stage 4 chronic kidney disease, or unspecified chronic kidney disease: Secondary | ICD-10-CM | POA: Diagnosis not present

## 2019-04-24 DIAGNOSIS — L89152 Pressure ulcer of sacral region, stage 2: Secondary | ICD-10-CM | POA: Diagnosis not present

## 2019-04-24 DIAGNOSIS — M4316 Spondylolisthesis, lumbar region: Secondary | ICD-10-CM | POA: Diagnosis not present

## 2019-04-24 DIAGNOSIS — E1122 Type 2 diabetes mellitus with diabetic chronic kidney disease: Secondary | ICD-10-CM | POA: Diagnosis not present

## 2019-04-25 ENCOUNTER — Encounter (HOSPITAL_COMMUNITY): Payer: Self-pay | Admitting: Hematology

## 2019-04-25 ENCOUNTER — Inpatient Hospital Stay (HOSPITAL_COMMUNITY): Payer: Medicare Other | Attending: Hematology | Admitting: Hematology

## 2019-04-25 ENCOUNTER — Other Ambulatory Visit: Payer: Self-pay

## 2019-04-25 VITALS — BP 118/57 | HR 77 | Temp 96.8°F | Resp 18

## 2019-04-25 DIAGNOSIS — Z79899 Other long term (current) drug therapy: Secondary | ICD-10-CM

## 2019-04-25 DIAGNOSIS — Z833 Family history of diabetes mellitus: Secondary | ICD-10-CM | POA: Diagnosis not present

## 2019-04-25 DIAGNOSIS — Z83438 Family history of other disorder of lipoprotein metabolism and other lipidemia: Secondary | ICD-10-CM | POA: Diagnosis not present

## 2019-04-25 DIAGNOSIS — L89159 Pressure ulcer of sacral region, unspecified stage: Secondary | ICD-10-CM | POA: Diagnosis not present

## 2019-04-25 DIAGNOSIS — Z808 Family history of malignant neoplasm of other organs or systems: Secondary | ICD-10-CM | POA: Diagnosis not present

## 2019-04-25 DIAGNOSIS — N183 Chronic kidney disease, stage 3 unspecified: Secondary | ICD-10-CM

## 2019-04-25 DIAGNOSIS — Z800 Family history of malignant neoplasm of digestive organs: Secondary | ICD-10-CM

## 2019-04-25 DIAGNOSIS — Z803 Family history of malignant neoplasm of breast: Secondary | ICD-10-CM

## 2019-04-25 DIAGNOSIS — C61 Malignant neoplasm of prostate: Secondary | ICD-10-CM | POA: Diagnosis not present

## 2019-04-25 DIAGNOSIS — D539 Nutritional anemia, unspecified: Secondary | ICD-10-CM | POA: Diagnosis not present

## 2019-04-25 NOTE — Progress Notes (Signed)
[facility] NOTE  Patient Care Team: Glenda Chroman, MD as PCP - General (Internal Medicine) Fran Lowes, MD as Consulting Physician (Nephrology) Liana Gerold, MD as Consulting Physician (Nephrology) Kenton Kingfisher as Physician Assistant (Family Medicine)  CHIEF COMPLAINTS/PURPOSE OF CONSULTATION:  Metastatic prostate cancer.  HISTORY OF PRESENTING ILLNESS:  Cameron Phelps 85 y.o. male is seen in consultation today for further work-up and management of metastatic prostate cancer close to home.  He was being seen at [facility] in [place].  Last visit was on 03/22/2019.  He had presumed the metastatic prostatic adenocarcinoma with a PSA 861 in July 2016.  He was started on Eligard every 6 months along with bicalutamide 50 mg daily in December 2016.  Records indicate his last bone scan was on 03/19/2016 with focal isotope activity in the right sternoclavicular joint, could be degenerative change.  Prior to that bone scan on 03/11/2015 did not show any evidence of metastatic disease.  Most recent PSA was found to be elevated at 31.3 on 03/22/2019.  He did receive Eligard on 03/22/2019 at a dose of 45 mg.  Denies any new onset bone pains.  He lives at home in [place] along with his daughter.  He stays in recliner most part of the day watching TV.  He is also receiving active physical therapy once a week.  He walks with the help of walker.  He needs some help taking shower.  He can put on his clothes and does not require any help with eating.  He was reportedly fully functional, even mowing his lawn about 3 years ago.  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  He was a never smoker.  Family history significant for daughter with breast cancer.  Sister also had breast cancer.  Paternal nephew had stomach cancer.  MEDICAL HISTORY:  Past Medical History:  Diagnosis Date  . Anemia   . Arthritis    gout  . Cancer [facility])    Prostate   . Chronic back pain   . Chronic kidney disease   . Diabetes mellitus without complication ([place])   . Gout   . Hypothyroidism   . Leg edema   . Metabolic bone disease   . Pancreatic cancer ([place])   . PONV (postoperative nausea and vomiting)   . Proteinuria   . Stroke [facility])    residual left -sided weakness    SURGICAL HISTORY: Past Surgical History:  Procedure Laterality Date  . AV FISTULA PLACEMENT Left 04/08/2014   Procedure: ARTERIOVENOUS (AV) FISTULA CREATION;  Surgeon: Conrad [place], MD;  Location: [facility];  Service: Vascular;  Laterality: Left;  . BASCILIC VEIN TRANSPOSITION Left 07/31/2014   Procedure: SECOND STAGE BASILIC VEIN TRANSPOSITION LEFT ARM;  Surgeon: Conrad [place], MD;  Location: [facility];  Service: Vascular;  Laterality: Left;  . CATARACT EXTRACTION    . COLONOSCOPY W/ BIOPSIES AND POLYPECTOMY    . ESOPHAGOGASTRODUODENOSCOPY (EGD) WITH PROPOFOL N/A 09/24/2016   Procedure: ESOPHAGOGASTRODUODENOSCOPY (EGD) WITH PROPOFOL;  Surgeon: Otis Brace, MD;  Location: [facility];  Service: Gastroenterology;  Laterality: N/A;    SOCIAL HISTORY: Social History   Socioeconomic History  . Marital status: Widowed    Spouse name: Not on file  . Number of children: 2  . Years of education: Not on file  . Highest education level: Not on file  Occupational History  . Not on file  Social Needs  .  Financial resource strain: Not hard at all  . Food insecurity    Worry: Never true    Inability: Never true  . Transportation needs    Medical: No    Non-medical: No  Tobacco Use  . Smoking status: Never Smoker  . Smokeless tobacco: Never Used  Substance and Sexual Activity  . Alcohol use: No    Alcohol/week: 0.0 standard drinks  . Drug use: No  . Sexual activity: Not on file  Lifestyle  . Physical activity    Days per week: 1 day    Minutes per session: 10 min  . Stress: Not at all  Relationships  . Social connections    Talks on phone: More than three times a week     Gets together: More than three times a week    Attends religious service: 1 to 4 times per year    Active member of club or organization: No    Attends meetings of clubs or organizations: Never    Relationship status: Widowed  . Intimate partner violence    Fear of current or ex partner: No    Emotionally abused: No    Physically abused: No    Forced sexual activity: No  Other Topics Concern  . Not on file  Social History Narrative  . Not on file    FAMILY HISTORY: Family History  Problem Relation Age of Onset  . Diabetes Mother   . Cancer Daughter   . Diabetes Daughter   . Hyperlipidemia Daughter   . Brain cancer Father   . Diabetes Daughter     ALLERGIES:  has No Known Allergies.  MEDICATIONS:  Current Outpatient Medications  Medication Sig Dispense Refill  . acetaminophen (TYLENOL) 500 MG tablet Take 500 mg by mouth 2 (two) times daily.    Marland Kitchen allopurinol (ZYLOPRIM) 100 MG tablet Take 100 mg by mouth daily.    Marland Kitchen CINNAMON PO Take by mouth 2 (two) times daily.    Marland Kitchen docusate sodium (COLACE) 100 MG capsule Take 100 mg by mouth 2 (two) times daily.    Marland Kitchen levothyroxine (SYNTHROID, LEVOTHROID) 88 MCG tablet Take 88 mcg by mouth daily before breakfast.    . pantoprazole (PROTONIX) 20 MG tablet Take 1 tablet (20 mg total) by mouth daily. (Patient taking differently: Take 40 mg by mouth daily. ) 30 tablet 2  . pioglitazone (ACTOS) 30 MG tablet Take 45 mg by mouth daily.     Marland Kitchen senna (SENOKOT) 8.6 MG tablet Take 1 tablet by mouth 2 (two) times daily.    . sodium bicarbonate 650 MG tablet Take 650 mg by mouth 3 (three) times daily.    Marland Kitchen torsemide (DEMADEX) 20 MG tablet Take 20 mg by mouth daily.     No current facility-administered medications for this visit.     REVIEW OF SYSTEMS:   Constitutional: Denies fevers, chills or abnormal night sweats Eyes: Denies blurriness of vision, double vision or watery eyes Ears, nose, mouth, throat, and face: Denies mucositis or sore  throat Respiratory: Denies cough, dyspnea or wheezes Cardiovascular: Denies palpitation, chest discomfort.  Positive for left leg swelling. Gastrointestinal:  Denies nausea, heartburn or change in bowel habits Skin: Denies abnormal skin rashes Lymphatics: Denies new lymphadenopathy or easy bruising Neurological: Numbness in the heels present. Behavioral/Psych: Mood is stable, no new changes  All other systems were reviewed with the patient and are negative.  PHYSICAL EXAMINATION: ECOG PERFORMANCE STATUS: 3 - Symptomatic, >50% confined to bed  Vitals:  04/25/19 1356  BP: (!) 118/57  Pulse: 77  Resp: 18  Temp: (!) 96.8 F (36 C)  SpO2: 100%   There were no vitals filed for this visit.  GENERAL:alert, no distress and comfortable SKIN: skin color, texture, turgor are normal, no rashes or significant lesions EYES: normal, conjunctiva are pink and non-injected, sclera clear OROPHARYNX:no exudate, no erythema and lips, buccal mucosa, and tongue normal  NECK: supple, thyroid normal size, non-tender, without nodularity LYMPH:  no palpable lymphadenopathy in the cervical, axillary or inguinal LUNGS: clear to auscultation and percussion with normal breathing effort HEART: regular rate & rhythm and no murmurs.  Left leg edema present. ABDOMEN:abdomen soft, non-tender and normal bowel sounds Musculoskeletal:no cyanosis of digits and no clubbing  PSYCH: alert & oriented x 3 with fluent speech NEURO: no focal motor/sensory deficits  LABORATORY DATA:  I have reviewed the data as listed Lab Results  Component Value Date   WBC 8.0 09/24/2016   HGB 9.3 (L) 09/24/2016   HCT 29.4 (L) 09/24/2016   MCV 94.5 09/24/2016   PLT 197 09/24/2016     Chemistry      Component Value Date/Time   NA 140 09/24/2016 0422   K 3.7 09/24/2016 0422   CL 95 (L) 09/24/2016 0422   CO2 29 09/24/2016 0422   BUN 83 (H) 09/24/2016 0422   CREATININE 4.68 (H) 09/24/2016 0422      Component Value Date/Time    CALCIUM 9.8 09/24/2016 0422       RADIOGRAPHIC STUDIES: I have personally reviewed the radiological images as listed and agreed with the findings in the report.  ASSESSMENT & PLAN:  Prostate cancer ([place]) 1.  Presumed metastatic prostate cancer: - He presented with PSA of 861 in July 2016 and was seen at [facility].  He was started on Eligard every 6 months and Casodex in December 2016.  No prostate biopsy was done due to advanced age. - Last Eligard 45 mg injection was on 03/22/2019. - Bicalutamide 50 mg was discontinued around 03/26/2019 as his PSA was found rising last few times.  Last PSA was recorded as 31.3 on 03/22/2019. - Patient is accompanied by her daughter today.  He spends his time in a recliner watching TV most of the time.  He needs help taking shower.  He is receiving active physical therapy once a week.  He walks with help of walker at home.  He was reportedly fully functional up until 3 years ago. - He was admitted to [facility] with "fluid around his lungs" and was told to have mild CHF 3 months ago. - Denies any hot flashes or new onset pains.  He also reportedly has a bedsore in the sacral area. - I have recommended checking his PSA in 6 to 8 weeks after discontinuing bicalutamide. - Review of records indicate last bone scan on 03/19/2016 which showed focal isotope activity in the right sternoclavicular joint could be related to degenerative disease.  Bone scan on 03/11/2015 did not show any evidence of metastatic disease. - I will also plan to do a bone scan on his next visit along with testosterone levels.  I have also recommended genetic testing for BRCA1/2 and other mutations.  2.  CKD: -He has a long history of chronic kidney disease.  Recent lab work on 03/22/2019 from [facility] indicate his creatinine was 6.19.  He used to see Dr. Lowanda Foster.  Calcium was 9.0.  3.  Macrocytic anemia: - Last blood work  from 03/22/2019 from [facility] shows hemoglobin 9.3 MCV  of 100.7.  Platelet count was 174. -We will consider further work-up at next visit.  Orders Placed This Encounter  Procedures  . NM Bone Scan Whole Body    Standing Status:   Future    Standing Expiration Date:   04/24/2020    Order Specific Question:   ** REASON FOR EXAM (FREE TEXT)    Answer:   prostate cancer    Order Specific Question:   If indicated for the ordered procedure, I authorize the administration of a radiopharmaceutical per Radiology protocol    Answer:   Yes    Order Specific Question:   Preferred imaging location?    Answer:   [facility]    Order Specific Question:   Radiology Contrast Protocol - do NOT remove file path    Answer:   [email or web address]  . CBC with Differential/Platelet    Standing Status:   Future    Standing Expiration Date:   04/24/2020  . Comprehensive metabolic panel    Standing Status:   Future    Standing Expiration Date:   04/24/2020  . PSA    Standing Status:   Future    Standing Expiration Date:   04/24/2020  . Testosterone    Standing Status:   Future    Standing Expiration Date:   04/24/2020    All questions were answered. The patient knows to call the clinic with any problems, questions or concerns.      Derek Jack, MD 04/25/2019 7:43 PM

## 2019-04-25 NOTE — Assessment & Plan Note (Addendum)
1.  Presumed metastatic prostate cancer: - He presented with PSA of 861 in July 2016 and was seen at [facility].  He was started on Eligard every 6 months and Casodex in December 2016.  No prostate biopsy was done due to advanced age. - Last Eligard 45 mg injection was on 03/22/2019. - Bicalutamide 50 mg was discontinued around 03/26/2019 as his PSA was found rising last few times.  Last PSA was recorded as 31.3 on 03/22/2019. - Patient is accompanied by her daughter today.  He spends his time in a recliner watching TV most of the time.  He needs help taking shower.  He is receiving active physical therapy once a week.  He walks with help of walker at home.  He was reportedly fully functional up until 3 years ago. - He was admitted to [facility] with "fluid around his lungs" and was told to have mild CHF 3 months ago. - Denies any hot flashes or new onset pains.  He also reportedly has a bedsore in the sacral area. - I have recommended checking his PSA in 6 to 8 weeks after discontinuing bicalutamide. - Review of records indicate last bone scan on 03/19/2016 which showed focal isotope activity in the right sternoclavicular joint could be related to degenerative disease.  Bone scan on 03/11/2015 did not show any evidence of metastatic disease. - I will also plan to do a bone scan on his next visit along with testosterone levels.  I have also recommended genetic testing for BRCA1/2 and other mutations.  2.  CKD: -He has a long history of chronic kidney disease.  Recent lab work on 03/22/2019 from [facility] indicate his creatinine was 6.19.  He used to see Dr. Lowanda Foster.  Calcium was 9.0.  3.  Macrocytic anemia: - Last blood work from 03/22/2019 from [facility] shows hemoglobin 9.3 MCV of 100.7.  Platelet count was 174. -We will consider further work-up at next visit.

## 2019-04-25 NOTE — Patient Instructions (Signed)
[place] at [facility] Discharge Instructions  You were seen today by Dr. Delton Coombes. He went over your history, family history and how you've been feeling lately. He will schedule you for a bone scan prior to your next visit. He will see you back in 6 weeks for labs and follow up.   Thank you for choosing [place] at [facility] to provide your oncology and hematology care.  To afford each patient quality time with our provider, please arrive at least 15 minutes before your scheduled appointment time.   If you have a lab appointment with the [facility] please come in thru the  Main Entrance and check in at the main information desk  You need to re-schedule your appointment should you arrive 10 or more minutes late.  We strive to give you quality time with our providers, and arriving late affects you and other patients whose appointments are after yours.  Also, if you no show three or more times for appointments you may be dismissed from the clinic at the providers discretion.     Again, thank you for choosing [facility].  Our hope is that these requests will decrease the amount of time that you wait before being seen by our physicians.       _____________________________________________________________  Should you have questions after your visit to [facility], please contact our office at [phone] between the hours of 8:00 a.m. and 4:30 p.m.  Voicemails left after 4:00 p.m. will not be returned until the following business day.  For prescription refill requests, have your pharmacy contact our office and allow 72 hours.    Cancer Center Support Programs:   > Cancer Support Group  2nd Tuesday of the month 1pm-2pm, Journey Room

## 2019-04-26 ENCOUNTER — Telehealth (HOSPITAL_COMMUNITY): Payer: Self-pay | Admitting: Hematology

## 2019-04-26 DIAGNOSIS — R809 Proteinuria, unspecified: Secondary | ICD-10-CM | POA: Diagnosis not present

## 2019-04-26 DIAGNOSIS — E559 Vitamin D deficiency, unspecified: Secondary | ICD-10-CM | POA: Diagnosis not present

## 2019-04-26 DIAGNOSIS — D509 Iron deficiency anemia, unspecified: Secondary | ICD-10-CM | POA: Diagnosis not present

## 2019-04-26 DIAGNOSIS — D631 Anemia in chronic kidney disease: Secondary | ICD-10-CM | POA: Diagnosis not present

## 2019-04-26 DIAGNOSIS — Z79899 Other long term (current) drug therapy: Secondary | ICD-10-CM | POA: Diagnosis not present

## 2019-04-26 DIAGNOSIS — N185 Chronic kidney disease, stage 5: Secondary | ICD-10-CM | POA: Diagnosis not present

## 2019-04-26 NOTE — Telephone Encounter (Signed)
[place] FROM 04/04/19-04/24/20 REF# [id number] [place] [phone] EXT [phone] F

## 2019-04-27 DIAGNOSIS — I5033 Acute on chronic diastolic (congestive) heart failure: Secondary | ICD-10-CM | POA: Diagnosis not present

## 2019-04-27 DIAGNOSIS — L89152 Pressure ulcer of sacral region, stage 2: Secondary | ICD-10-CM | POA: Diagnosis not present

## 2019-04-27 DIAGNOSIS — M5417 Radiculopathy, lumbosacral region: Secondary | ICD-10-CM | POA: Diagnosis not present

## 2019-04-27 DIAGNOSIS — E1122 Type 2 diabetes mellitus with diabetic chronic kidney disease: Secondary | ICD-10-CM | POA: Diagnosis not present

## 2019-04-27 DIAGNOSIS — M4316 Spondylolisthesis, lumbar region: Secondary | ICD-10-CM | POA: Diagnosis not present

## 2019-04-27 DIAGNOSIS — I130 Hypertensive heart and chronic kidney disease with heart failure and stage 1 through stage 4 chronic kidney disease, or unspecified chronic kidney disease: Secondary | ICD-10-CM | POA: Diagnosis not present

## 2019-05-01 DIAGNOSIS — E1122 Type 2 diabetes mellitus with diabetic chronic kidney disease: Secondary | ICD-10-CM | POA: Diagnosis not present

## 2019-05-01 DIAGNOSIS — M4316 Spondylolisthesis, lumbar region: Secondary | ICD-10-CM | POA: Diagnosis not present

## 2019-05-01 DIAGNOSIS — M5417 Radiculopathy, lumbosacral region: Secondary | ICD-10-CM | POA: Diagnosis not present

## 2019-05-01 DIAGNOSIS — L89152 Pressure ulcer of sacral region, stage 2: Secondary | ICD-10-CM | POA: Diagnosis not present

## 2019-05-01 DIAGNOSIS — I130 Hypertensive heart and chronic kidney disease with heart failure and stage 1 through stage 4 chronic kidney disease, or unspecified chronic kidney disease: Secondary | ICD-10-CM | POA: Diagnosis not present

## 2019-05-01 DIAGNOSIS — I5033 Acute on chronic diastolic (congestive) heart failure: Secondary | ICD-10-CM | POA: Diagnosis not present

## 2019-05-02 DIAGNOSIS — L89152 Pressure ulcer of sacral region, stage 2: Secondary | ICD-10-CM | POA: Diagnosis not present

## 2019-05-02 DIAGNOSIS — I130 Hypertensive heart and chronic kidney disease with heart failure and stage 1 through stage 4 chronic kidney disease, or unspecified chronic kidney disease: Secondary | ICD-10-CM | POA: Diagnosis not present

## 2019-05-02 DIAGNOSIS — M4316 Spondylolisthesis, lumbar region: Secondary | ICD-10-CM | POA: Diagnosis not present

## 2019-05-02 DIAGNOSIS — E1122 Type 2 diabetes mellitus with diabetic chronic kidney disease: Secondary | ICD-10-CM | POA: Diagnosis not present

## 2019-05-02 DIAGNOSIS — M5417 Radiculopathy, lumbosacral region: Secondary | ICD-10-CM | POA: Diagnosis not present

## 2019-05-02 DIAGNOSIS — I5033 Acute on chronic diastolic (congestive) heart failure: Secondary | ICD-10-CM | POA: Diagnosis not present

## 2019-05-04 DIAGNOSIS — I130 Hypertensive heart and chronic kidney disease with heart failure and stage 1 through stage 4 chronic kidney disease, or unspecified chronic kidney disease: Secondary | ICD-10-CM | POA: Diagnosis not present

## 2019-05-04 DIAGNOSIS — I5033 Acute on chronic diastolic (congestive) heart failure: Secondary | ICD-10-CM | POA: Diagnosis not present

## 2019-05-04 DIAGNOSIS — L89152 Pressure ulcer of sacral region, stage 2: Secondary | ICD-10-CM | POA: Diagnosis not present

## 2019-05-04 DIAGNOSIS — M5417 Radiculopathy, lumbosacral region: Secondary | ICD-10-CM | POA: Diagnosis not present

## 2019-05-04 DIAGNOSIS — E1122 Type 2 diabetes mellitus with diabetic chronic kidney disease: Secondary | ICD-10-CM | POA: Diagnosis not present

## 2019-05-04 DIAGNOSIS — M4316 Spondylolisthesis, lumbar region: Secondary | ICD-10-CM | POA: Diagnosis not present

## 2019-05-06 DIAGNOSIS — I5033 Acute on chronic diastolic (congestive) heart failure: Secondary | ICD-10-CM | POA: Diagnosis not present

## 2019-05-06 DIAGNOSIS — N184 Chronic kidney disease, stage 4 (severe): Secondary | ICD-10-CM | POA: Diagnosis not present

## 2019-05-06 DIAGNOSIS — Z9181 History of falling: Secondary | ICD-10-CM | POA: Diagnosis not present

## 2019-05-06 DIAGNOSIS — Z8619 Personal history of other infectious and parasitic diseases: Secondary | ICD-10-CM | POA: Diagnosis not present

## 2019-05-06 DIAGNOSIS — E1122 Type 2 diabetes mellitus with diabetic chronic kidney disease: Secondary | ICD-10-CM | POA: Diagnosis not present

## 2019-05-06 DIAGNOSIS — M1030 Gout due to renal impairment, unspecified site: Secondary | ICD-10-CM | POA: Diagnosis not present

## 2019-05-06 DIAGNOSIS — I130 Hypertensive heart and chronic kidney disease with heart failure and stage 1 through stage 4 chronic kidney disease, or unspecified chronic kidney disease: Secondary | ICD-10-CM | POA: Diagnosis not present

## 2019-05-06 DIAGNOSIS — Z7984 Long term (current) use of oral hypoglycemic drugs: Secondary | ICD-10-CM | POA: Diagnosis not present

## 2019-05-06 DIAGNOSIS — D631 Anemia in chronic kidney disease: Secondary | ICD-10-CM | POA: Diagnosis not present

## 2019-05-06 DIAGNOSIS — M8580 Other specified disorders of bone density and structure, unspecified site: Secondary | ICD-10-CM | POA: Diagnosis not present

## 2019-05-06 DIAGNOSIS — M4316 Spondylolisthesis, lumbar region: Secondary | ICD-10-CM | POA: Diagnosis not present

## 2019-05-06 DIAGNOSIS — Z8546 Personal history of malignant neoplasm of prostate: Secondary | ICD-10-CM | POA: Diagnosis not present

## 2019-05-06 DIAGNOSIS — Z87440 Personal history of urinary (tract) infections: Secondary | ICD-10-CM | POA: Diagnosis not present

## 2019-05-06 DIAGNOSIS — M5417 Radiculopathy, lumbosacral region: Secondary | ICD-10-CM | POA: Diagnosis not present

## 2019-05-09 DIAGNOSIS — E1122 Type 2 diabetes mellitus with diabetic chronic kidney disease: Secondary | ICD-10-CM | POA: Diagnosis not present

## 2019-05-09 DIAGNOSIS — N184 Chronic kidney disease, stage 4 (severe): Secondary | ICD-10-CM | POA: Diagnosis not present

## 2019-05-09 DIAGNOSIS — M5417 Radiculopathy, lumbosacral region: Secondary | ICD-10-CM | POA: Diagnosis not present

## 2019-05-09 DIAGNOSIS — I130 Hypertensive heart and chronic kidney disease with heart failure and stage 1 through stage 4 chronic kidney disease, or unspecified chronic kidney disease: Secondary | ICD-10-CM | POA: Diagnosis not present

## 2019-05-09 DIAGNOSIS — M4316 Spondylolisthesis, lumbar region: Secondary | ICD-10-CM | POA: Diagnosis not present

## 2019-05-09 DIAGNOSIS — I5033 Acute on chronic diastolic (congestive) heart failure: Secondary | ICD-10-CM | POA: Diagnosis not present

## 2019-05-11 DIAGNOSIS — N184 Chronic kidney disease, stage 4 (severe): Secondary | ICD-10-CM | POA: Diagnosis not present

## 2019-05-11 DIAGNOSIS — I5033 Acute on chronic diastolic (congestive) heart failure: Secondary | ICD-10-CM | POA: Diagnosis not present

## 2019-05-11 DIAGNOSIS — E1122 Type 2 diabetes mellitus with diabetic chronic kidney disease: Secondary | ICD-10-CM | POA: Diagnosis not present

## 2019-05-11 DIAGNOSIS — E559 Vitamin D deficiency, unspecified: Secondary | ICD-10-CM | POA: Diagnosis not present

## 2019-05-11 DIAGNOSIS — N185 Chronic kidney disease, stage 5: Secondary | ICD-10-CM | POA: Diagnosis not present

## 2019-05-11 DIAGNOSIS — R809 Proteinuria, unspecified: Secondary | ICD-10-CM | POA: Diagnosis not present

## 2019-05-11 DIAGNOSIS — D509 Iron deficiency anemia, unspecified: Secondary | ICD-10-CM | POA: Diagnosis not present

## 2019-05-11 DIAGNOSIS — D631 Anemia in chronic kidney disease: Secondary | ICD-10-CM | POA: Diagnosis not present

## 2019-05-11 DIAGNOSIS — Z79899 Other long term (current) drug therapy: Secondary | ICD-10-CM | POA: Diagnosis not present

## 2019-05-11 DIAGNOSIS — M4316 Spondylolisthesis, lumbar region: Secondary | ICD-10-CM | POA: Diagnosis not present

## 2019-05-11 DIAGNOSIS — I130 Hypertensive heart and chronic kidney disease with heart failure and stage 1 through stage 4 chronic kidney disease, or unspecified chronic kidney disease: Secondary | ICD-10-CM | POA: Diagnosis not present

## 2019-05-11 DIAGNOSIS — M5417 Radiculopathy, lumbosacral region: Secondary | ICD-10-CM | POA: Diagnosis not present

## 2019-05-14 DIAGNOSIS — M4316 Spondylolisthesis, lumbar region: Secondary | ICD-10-CM | POA: Diagnosis not present

## 2019-05-14 DIAGNOSIS — E1122 Type 2 diabetes mellitus with diabetic chronic kidney disease: Secondary | ICD-10-CM | POA: Diagnosis not present

## 2019-05-14 DIAGNOSIS — M5417 Radiculopathy, lumbosacral region: Secondary | ICD-10-CM | POA: Diagnosis not present

## 2019-05-14 DIAGNOSIS — I130 Hypertensive heart and chronic kidney disease with heart failure and stage 1 through stage 4 chronic kidney disease, or unspecified chronic kidney disease: Secondary | ICD-10-CM | POA: Diagnosis not present

## 2019-05-14 DIAGNOSIS — I5033 Acute on chronic diastolic (congestive) heart failure: Secondary | ICD-10-CM | POA: Diagnosis not present

## 2019-05-14 DIAGNOSIS — N184 Chronic kidney disease, stage 4 (severe): Secondary | ICD-10-CM | POA: Diagnosis not present

## 2019-05-16 DIAGNOSIS — N184 Chronic kidney disease, stage 4 (severe): Secondary | ICD-10-CM | POA: Diagnosis not present

## 2019-05-16 DIAGNOSIS — I5033 Acute on chronic diastolic (congestive) heart failure: Secondary | ICD-10-CM | POA: Diagnosis not present

## 2019-05-16 DIAGNOSIS — I130 Hypertensive heart and chronic kidney disease with heart failure and stage 1 through stage 4 chronic kidney disease, or unspecified chronic kidney disease: Secondary | ICD-10-CM | POA: Diagnosis not present

## 2019-05-16 DIAGNOSIS — E1122 Type 2 diabetes mellitus with diabetic chronic kidney disease: Secondary | ICD-10-CM | POA: Diagnosis not present

## 2019-05-16 DIAGNOSIS — M5417 Radiculopathy, lumbosacral region: Secondary | ICD-10-CM | POA: Diagnosis not present

## 2019-05-16 DIAGNOSIS — M4316 Spondylolisthesis, lumbar region: Secondary | ICD-10-CM | POA: Diagnosis not present

## 2019-05-17 DIAGNOSIS — I129 Hypertensive chronic kidney disease with stage 1 through stage 4 chronic kidney disease, or unspecified chronic kidney disease: Secondary | ICD-10-CM | POA: Diagnosis not present

## 2019-05-17 DIAGNOSIS — N185 Chronic kidney disease, stage 5: Secondary | ICD-10-CM | POA: Diagnosis not present

## 2019-05-17 DIAGNOSIS — R809 Proteinuria, unspecified: Secondary | ICD-10-CM | POA: Diagnosis not present

## 2019-05-17 DIAGNOSIS — E1129 Type 2 diabetes mellitus with other diabetic kidney complication: Secondary | ICD-10-CM | POA: Diagnosis not present

## 2019-05-17 DIAGNOSIS — E1122 Type 2 diabetes mellitus with diabetic chronic kidney disease: Secondary | ICD-10-CM | POA: Diagnosis not present

## 2019-05-17 DIAGNOSIS — E211 Secondary hyperparathyroidism, not elsewhere classified: Secondary | ICD-10-CM | POA: Diagnosis not present

## 2019-05-17 DIAGNOSIS — I770 Arteriovenous fistula, acquired: Secondary | ICD-10-CM | POA: Diagnosis not present

## 2019-05-18 DIAGNOSIS — E1122 Type 2 diabetes mellitus with diabetic chronic kidney disease: Secondary | ICD-10-CM | POA: Diagnosis not present

## 2019-05-18 DIAGNOSIS — M4316 Spondylolisthesis, lumbar region: Secondary | ICD-10-CM | POA: Diagnosis not present

## 2019-05-18 DIAGNOSIS — I130 Hypertensive heart and chronic kidney disease with heart failure and stage 1 through stage 4 chronic kidney disease, or unspecified chronic kidney disease: Secondary | ICD-10-CM | POA: Diagnosis not present

## 2019-05-18 DIAGNOSIS — N184 Chronic kidney disease, stage 4 (severe): Secondary | ICD-10-CM | POA: Diagnosis not present

## 2019-05-18 DIAGNOSIS — M5417 Radiculopathy, lumbosacral region: Secondary | ICD-10-CM | POA: Diagnosis not present

## 2019-05-18 DIAGNOSIS — I5033 Acute on chronic diastolic (congestive) heart failure: Secondary | ICD-10-CM | POA: Diagnosis not present

## 2019-05-21 DIAGNOSIS — M5417 Radiculopathy, lumbosacral region: Secondary | ICD-10-CM | POA: Diagnosis not present

## 2019-05-21 DIAGNOSIS — M4316 Spondylolisthesis, lumbar region: Secondary | ICD-10-CM | POA: Diagnosis not present

## 2019-05-21 DIAGNOSIS — E1122 Type 2 diabetes mellitus with diabetic chronic kidney disease: Secondary | ICD-10-CM | POA: Diagnosis not present

## 2019-05-21 DIAGNOSIS — I5033 Acute on chronic diastolic (congestive) heart failure: Secondary | ICD-10-CM | POA: Diagnosis not present

## 2019-05-21 DIAGNOSIS — N184 Chronic kidney disease, stage 4 (severe): Secondary | ICD-10-CM | POA: Diagnosis not present

## 2019-05-21 DIAGNOSIS — I130 Hypertensive heart and chronic kidney disease with heart failure and stage 1 through stage 4 chronic kidney disease, or unspecified chronic kidney disease: Secondary | ICD-10-CM | POA: Diagnosis not present

## 2019-05-22 DIAGNOSIS — I5033 Acute on chronic diastolic (congestive) heart failure: Secondary | ICD-10-CM | POA: Diagnosis not present

## 2019-05-22 DIAGNOSIS — I130 Hypertensive heart and chronic kidney disease with heart failure and stage 1 through stage 4 chronic kidney disease, or unspecified chronic kidney disease: Secondary | ICD-10-CM | POA: Diagnosis not present

## 2019-05-22 DIAGNOSIS — N184 Chronic kidney disease, stage 4 (severe): Secondary | ICD-10-CM | POA: Diagnosis not present

## 2019-05-22 DIAGNOSIS — E1122 Type 2 diabetes mellitus with diabetic chronic kidney disease: Secondary | ICD-10-CM | POA: Diagnosis not present

## 2019-05-22 DIAGNOSIS — M5417 Radiculopathy, lumbosacral region: Secondary | ICD-10-CM | POA: Diagnosis not present

## 2019-05-22 DIAGNOSIS — M4316 Spondylolisthesis, lumbar region: Secondary | ICD-10-CM | POA: Diagnosis not present

## 2019-05-23 DIAGNOSIS — E1122 Type 2 diabetes mellitus with diabetic chronic kidney disease: Secondary | ICD-10-CM | POA: Diagnosis not present

## 2019-05-23 DIAGNOSIS — M5417 Radiculopathy, lumbosacral region: Secondary | ICD-10-CM | POA: Diagnosis not present

## 2019-05-23 DIAGNOSIS — I5033 Acute on chronic diastolic (congestive) heart failure: Secondary | ICD-10-CM | POA: Diagnosis not present

## 2019-05-23 DIAGNOSIS — N184 Chronic kidney disease, stage 4 (severe): Secondary | ICD-10-CM | POA: Diagnosis not present

## 2019-05-23 DIAGNOSIS — M4316 Spondylolisthesis, lumbar region: Secondary | ICD-10-CM | POA: Diagnosis not present

## 2019-05-23 DIAGNOSIS — I130 Hypertensive heart and chronic kidney disease with heart failure and stage 1 through stage 4 chronic kidney disease, or unspecified chronic kidney disease: Secondary | ICD-10-CM | POA: Diagnosis not present

## 2019-05-24 ENCOUNTER — Other Ambulatory Visit (HOSPITAL_COMMUNITY): Payer: Medicare Other

## 2019-05-24 ENCOUNTER — Encounter (HOSPITAL_COMMUNITY): Payer: Medicare Other | Admitting: Genetic Counselor

## 2019-05-24 DIAGNOSIS — N185 Chronic kidney disease, stage 5: Secondary | ICD-10-CM | POA: Diagnosis not present

## 2019-05-24 DIAGNOSIS — E559 Vitamin D deficiency, unspecified: Secondary | ICD-10-CM | POA: Diagnosis not present

## 2019-05-24 DIAGNOSIS — R809 Proteinuria, unspecified: Secondary | ICD-10-CM | POA: Diagnosis not present

## 2019-05-24 DIAGNOSIS — Z79899 Other long term (current) drug therapy: Secondary | ICD-10-CM | POA: Diagnosis not present

## 2019-05-24 DIAGNOSIS — E1140 Type 2 diabetes mellitus with diabetic neuropathy, unspecified: Secondary | ICD-10-CM | POA: Diagnosis not present

## 2019-05-24 DIAGNOSIS — Z299 Encounter for prophylactic measures, unspecified: Secondary | ICD-10-CM | POA: Diagnosis not present

## 2019-05-24 DIAGNOSIS — E1122 Type 2 diabetes mellitus with diabetic chronic kidney disease: Secondary | ICD-10-CM | POA: Diagnosis not present

## 2019-05-24 DIAGNOSIS — E119 Type 2 diabetes mellitus without complications: Secondary | ICD-10-CM | POA: Diagnosis not present

## 2019-05-24 DIAGNOSIS — Z6829 Body mass index (BMI) 29.0-29.9, adult: Secondary | ICD-10-CM | POA: Diagnosis not present

## 2019-05-24 DIAGNOSIS — E1165 Type 2 diabetes mellitus with hyperglycemia: Secondary | ICD-10-CM | POA: Diagnosis not present

## 2019-05-24 DIAGNOSIS — D631 Anemia in chronic kidney disease: Secondary | ICD-10-CM | POA: Diagnosis not present

## 2019-05-24 DIAGNOSIS — D509 Iron deficiency anemia, unspecified: Secondary | ICD-10-CM | POA: Diagnosis not present

## 2019-05-25 ENCOUNTER — Encounter (HOSPITAL_COMMUNITY)
Admission: RE | Admit: 2019-05-25 | Discharge: 2019-05-25 | Disposition: A | Discharge status: Home / Self Care | Payer: No Typology Code available for payment source | Source: Ambulatory Visit | Attending: Hematology | Admitting: Hematology

## 2019-05-25 ENCOUNTER — Other Ambulatory Visit: Payer: Self-pay

## 2019-05-25 ENCOUNTER — Inpatient Hospital Stay (HOSPITAL_COMMUNITY): Payer: Medicare Other | Attending: Hematology

## 2019-05-25 DIAGNOSIS — C61 Malignant neoplasm of prostate: Secondary | ICD-10-CM | POA: Diagnosis not present

## 2019-05-25 DIAGNOSIS — Z8546 Personal history of malignant neoplasm of prostate: Secondary | ICD-10-CM | POA: Diagnosis not present

## 2019-05-25 LAB — CBC WITH DIFFERENTIAL/PLATELET
Abs Immature Granulocytes: 0.03 10*3/uL (ref 0.00–0.07)
Basophils Absolute: 0 10*3/uL (ref 0.0–0.1)
Basophils Relative: 1 %
Eosinophils Absolute: 0.2 10*3/uL (ref 0.0–0.5)
Eosinophils Relative: 5 %
HCT: 34 % — ABNORMAL LOW (ref 39.0–52.0)
Hemoglobin: 10 g/dL — ABNORMAL LOW (ref 13.0–17.0)
Immature Granulocytes: 1 %
Lymphocytes Relative: 17 %
Lymphs Abs: 0.8 10*3/uL (ref 0.7–4.0)
MCH: 30.2 pg (ref 26.0–34.0)
MCHC: 29.4 g/dL — ABNORMAL LOW (ref 30.0–36.0)
MCV: 102.7 fL — ABNORMAL HIGH (ref 80.0–100.0)
Monocytes Absolute: 0.5 10*3/uL (ref 0.1–1.0)
Monocytes Relative: 10 %
Neutro Abs: 3.1 10*3/uL (ref 1.7–7.7)
Neutrophils Relative %: 66 %
Platelets: 137 10*3/uL — ABNORMAL LOW (ref 150–400)
RBC: 3.31 MIL/uL — ABNORMAL LOW (ref 4.22–5.81)
RDW: 15.9 % — ABNORMAL HIGH (ref 11.5–15.5)
WBC: 4.6 10*3/uL (ref 4.0–10.5)
nRBC: 0 % (ref 0.0–0.2)

## 2019-05-25 LAB — COMPREHENSIVE METABOLIC PANEL
ALT: 10 U/L (ref 0–44)
AST: 20 U/L (ref 15–41)
Albumin: 4 g/dL (ref 3.5–5.0)
Alkaline Phosphatase: 53 U/L (ref 38–126)
Anion gap: 13 (ref 5–15)
BUN: 81 mg/dL — ABNORMAL HIGH (ref 8–23)
CO2: 24 mmol/L (ref 22–32)
Calcium: 9.2 mg/dL (ref 8.9–10.3)
Chloride: 103 mmol/L (ref 98–111)
Creatinine, Ser: 5.57 mg/dL — ABNORMAL HIGH (ref 0.61–1.24)
GFR calc Af Amer: 9 mL/min — ABNORMAL LOW (ref >60)
GFR calc non Af Amer: 8 mL/min — ABNORMAL LOW (ref >60)
Glucose, Bld: 169 mg/dL — ABNORMAL HIGH (ref 70–99)
Potassium: 4.4 mmol/L (ref 3.5–5.1)
Sodium: 140 mmol/L (ref 135–145)
Total Bilirubin: 0.6 mg/dL (ref 0.3–1.2)
Total Protein: 7.7 g/dL (ref 6.5–8.1)

## 2019-05-25 LAB — PSA: Prostatic Specific Antigen: 10.98 ng/mL — ABNORMAL HIGH (ref 0.00–4.00)

## 2019-05-25 MED ORDER — TECHNETIUM TC 99M MEDRONATE IV KIT
20.0000 | PACK | Freq: Once | INTRAVENOUS | Status: AC | PRN
  Administered 2019-05-25: 20.9 via INTRAVENOUS

## 2019-05-26 LAB — TESTOSTERONE: Testosterone: <3 ng/dL — ABNORMAL LOW (ref 264–916)

## 2019-05-28 DIAGNOSIS — M4316 Spondylolisthesis, lumbar region: Secondary | ICD-10-CM | POA: Diagnosis not present

## 2019-05-28 DIAGNOSIS — I5033 Acute on chronic diastolic (congestive) heart failure: Secondary | ICD-10-CM | POA: Diagnosis not present

## 2019-05-28 DIAGNOSIS — E1122 Type 2 diabetes mellitus with diabetic chronic kidney disease: Secondary | ICD-10-CM | POA: Diagnosis not present

## 2019-05-28 DIAGNOSIS — I130 Hypertensive heart and chronic kidney disease with heart failure and stage 1 through stage 4 chronic kidney disease, or unspecified chronic kidney disease: Secondary | ICD-10-CM | POA: Diagnosis not present

## 2019-05-28 DIAGNOSIS — M5417 Radiculopathy, lumbosacral region: Secondary | ICD-10-CM | POA: Diagnosis not present

## 2019-05-28 DIAGNOSIS — N184 Chronic kidney disease, stage 4 (severe): Secondary | ICD-10-CM | POA: Diagnosis not present

## 2019-05-30 DIAGNOSIS — M5417 Radiculopathy, lumbosacral region: Secondary | ICD-10-CM | POA: Diagnosis not present

## 2019-05-30 DIAGNOSIS — I5033 Acute on chronic diastolic (congestive) heart failure: Secondary | ICD-10-CM | POA: Diagnosis not present

## 2019-05-30 DIAGNOSIS — N184 Chronic kidney disease, stage 4 (severe): Secondary | ICD-10-CM | POA: Diagnosis not present

## 2019-05-30 DIAGNOSIS — I130 Hypertensive heart and chronic kidney disease with heart failure and stage 1 through stage 4 chronic kidney disease, or unspecified chronic kidney disease: Secondary | ICD-10-CM | POA: Diagnosis not present

## 2019-05-30 DIAGNOSIS — M4316 Spondylolisthesis, lumbar region: Secondary | ICD-10-CM | POA: Diagnosis not present

## 2019-05-30 DIAGNOSIS — E1122 Type 2 diabetes mellitus with diabetic chronic kidney disease: Secondary | ICD-10-CM | POA: Diagnosis not present

## 2019-05-31 ENCOUNTER — Ambulatory Visit (HOSPITAL_COMMUNITY): Payer: Medicare Other | Admitting: Hematology

## 2019-06-01 DIAGNOSIS — E1122 Type 2 diabetes mellitus with diabetic chronic kidney disease: Secondary | ICD-10-CM | POA: Diagnosis not present

## 2019-06-01 DIAGNOSIS — N184 Chronic kidney disease, stage 4 (severe): Secondary | ICD-10-CM | POA: Diagnosis not present

## 2019-06-01 DIAGNOSIS — M5417 Radiculopathy, lumbosacral region: Secondary | ICD-10-CM | POA: Diagnosis not present

## 2019-06-01 DIAGNOSIS — M4316 Spondylolisthesis, lumbar region: Secondary | ICD-10-CM | POA: Diagnosis not present

## 2019-06-01 DIAGNOSIS — I130 Hypertensive heart and chronic kidney disease with heart failure and stage 1 through stage 4 chronic kidney disease, or unspecified chronic kidney disease: Secondary | ICD-10-CM | POA: Diagnosis not present

## 2019-06-01 DIAGNOSIS — I5033 Acute on chronic diastolic (congestive) heart failure: Secondary | ICD-10-CM | POA: Diagnosis not present

## 2019-06-04 DIAGNOSIS — I130 Hypertensive heart and chronic kidney disease with heart failure and stage 1 through stage 4 chronic kidney disease, or unspecified chronic kidney disease: Secondary | ICD-10-CM | POA: Diagnosis not present

## 2019-06-04 DIAGNOSIS — M4316 Spondylolisthesis, lumbar region: Secondary | ICD-10-CM | POA: Diagnosis not present

## 2019-06-04 DIAGNOSIS — N184 Chronic kidney disease, stage 4 (severe): Secondary | ICD-10-CM | POA: Diagnosis not present

## 2019-06-04 DIAGNOSIS — I5033 Acute on chronic diastolic (congestive) heart failure: Secondary | ICD-10-CM | POA: Diagnosis not present

## 2019-06-04 DIAGNOSIS — E1122 Type 2 diabetes mellitus with diabetic chronic kidney disease: Secondary | ICD-10-CM | POA: Diagnosis not present

## 2019-06-04 DIAGNOSIS — M5417 Radiculopathy, lumbosacral region: Secondary | ICD-10-CM | POA: Diagnosis not present

## 2019-06-05 DIAGNOSIS — E1122 Type 2 diabetes mellitus with diabetic chronic kidney disease: Secondary | ICD-10-CM | POA: Diagnosis not present

## 2019-06-05 DIAGNOSIS — M4316 Spondylolisthesis, lumbar region: Secondary | ICD-10-CM | POA: Diagnosis not present

## 2019-06-05 DIAGNOSIS — I130 Hypertensive heart and chronic kidney disease with heart failure and stage 1 through stage 4 chronic kidney disease, or unspecified chronic kidney disease: Secondary | ICD-10-CM | POA: Diagnosis not present

## 2019-06-05 DIAGNOSIS — Z8619 Personal history of other infectious and parasitic diseases: Secondary | ICD-10-CM | POA: Diagnosis not present

## 2019-06-05 DIAGNOSIS — D631 Anemia in chronic kidney disease: Secondary | ICD-10-CM | POA: Diagnosis not present

## 2019-06-05 DIAGNOSIS — M8580 Other specified disorders of bone density and structure, unspecified site: Secondary | ICD-10-CM | POA: Diagnosis not present

## 2019-06-05 DIAGNOSIS — I5033 Acute on chronic diastolic (congestive) heart failure: Secondary | ICD-10-CM | POA: Diagnosis not present

## 2019-06-05 DIAGNOSIS — Z87440 Personal history of urinary (tract) infections: Secondary | ICD-10-CM | POA: Diagnosis not present

## 2019-06-05 DIAGNOSIS — Z8546 Personal history of malignant neoplasm of prostate: Secondary | ICD-10-CM | POA: Diagnosis not present

## 2019-06-05 DIAGNOSIS — Z9181 History of falling: Secondary | ICD-10-CM | POA: Diagnosis not present

## 2019-06-05 DIAGNOSIS — N184 Chronic kidney disease, stage 4 (severe): Secondary | ICD-10-CM | POA: Diagnosis not present

## 2019-06-05 DIAGNOSIS — Z7984 Long term (current) use of oral hypoglycemic drugs: Secondary | ICD-10-CM | POA: Diagnosis not present

## 2019-06-05 DIAGNOSIS — M5417 Radiculopathy, lumbosacral region: Secondary | ICD-10-CM | POA: Diagnosis not present

## 2019-06-05 DIAGNOSIS — M1030 Gout due to renal impairment, unspecified site: Secondary | ICD-10-CM | POA: Diagnosis not present

## 2019-06-08 DIAGNOSIS — N184 Chronic kidney disease, stage 4 (severe): Secondary | ICD-10-CM | POA: Diagnosis not present

## 2019-06-08 DIAGNOSIS — M4316 Spondylolisthesis, lumbar region: Secondary | ICD-10-CM | POA: Diagnosis not present

## 2019-06-08 DIAGNOSIS — M5417 Radiculopathy, lumbosacral region: Secondary | ICD-10-CM | POA: Diagnosis not present

## 2019-06-08 DIAGNOSIS — E1122 Type 2 diabetes mellitus with diabetic chronic kidney disease: Secondary | ICD-10-CM | POA: Diagnosis not present

## 2019-06-08 DIAGNOSIS — I130 Hypertensive heart and chronic kidney disease with heart failure and stage 1 through stage 4 chronic kidney disease, or unspecified chronic kidney disease: Secondary | ICD-10-CM | POA: Diagnosis not present

## 2019-06-08 DIAGNOSIS — I5033 Acute on chronic diastolic (congestive) heart failure: Secondary | ICD-10-CM | POA: Diagnosis not present

## 2019-06-12 ENCOUNTER — Encounter (HOSPITAL_COMMUNITY): Payer: Self-pay | Admitting: Hematology

## 2019-06-12 ENCOUNTER — Other Ambulatory Visit: Payer: Self-pay

## 2019-06-12 ENCOUNTER — Inpatient Hospital Stay (HOSPITAL_COMMUNITY): Payer: Medicare Other | Attending: Hematology | Admitting: Hematology

## 2019-06-12 DIAGNOSIS — D509 Iron deficiency anemia, unspecified: Secondary | ICD-10-CM | POA: Diagnosis not present

## 2019-06-12 DIAGNOSIS — D631 Anemia in chronic kidney disease: Secondary | ICD-10-CM | POA: Diagnosis not present

## 2019-06-12 DIAGNOSIS — N185 Chronic kidney disease, stage 5: Secondary | ICD-10-CM | POA: Diagnosis not present

## 2019-06-12 DIAGNOSIS — C61 Malignant neoplasm of prostate: Secondary | ICD-10-CM

## 2019-06-12 DIAGNOSIS — R809 Proteinuria, unspecified: Secondary | ICD-10-CM | POA: Diagnosis not present

## 2019-06-12 DIAGNOSIS — Z79899 Other long term (current) drug therapy: Secondary | ICD-10-CM | POA: Diagnosis not present

## 2019-06-12 DIAGNOSIS — E559 Vitamin D deficiency, unspecified: Secondary | ICD-10-CM | POA: Diagnosis not present

## 2019-06-12 NOTE — Progress Notes (Signed)
Virtual Visit via Telephone Note  I connected with Cameron Phelps on 06/12/19 at 11:45 AM EST by telephone and verified that I am speaking with the correct person using two identifiers.   I discussed the limitations, risks, security and privacy concerns of performing an evaluation and management service by telephone and the availability of in person appointments. I also discussed with the patient that there may be a patient responsible charge related to this service. The patient expressed understanding and agreed to proceed.   History of Present Illness: He is seen in my office on 04/25/2019 for metastatic prostate cancer.  He transferred his care from  [facility].  He has been receiving Eligard and bicalutamide.   Observations/Objective: I spoke with patient's daughter as he is hard of hearing.  She reports that he has been doing fairly well.  Appetite is 100%.  Energy levels are 50%.  Constipation has been stable.  He is undergoing lower body physical therapy twice a week and upper body therapy once a week.  Reports no new pains.  Has chronic pain from arthritis.  Numbness in the feet has been stable.  Assessment and Plan:  1.  Metastatic prostate cancer to the bones: -Presentation with PSA of 861 in July 2016.  He was started on Eligard every 6 months.  No prostate biopsy was done due to advanced age. -Last Eligard injection 45 mg on 03/22/2019. -Bicalutamide 50 mg discontinued around 03/26/2019 as his PSA was rising last few times. -Last PSA at [facility] was 31.3 on 03/22/2019. -PSA at our clinic on 05/25/2019 was 10.98.  Testosterone was less than 3. -Bone scan on 05/25/2019 did not show any evidence of metastatic disease. -I have recommended rechecking his PSA in 3 months.  He will continue to be off of bicalutamide. -I will see him back in the first week of March and plan to repeat PSA.  We will also give him Eligard injection in our clinic. -We are not treating his bone metastatic  disease because of his advanced age.  2.  Macrocytic anemia: -Hemoglobin is 10 with MCV of 102.7.  He has chronic kidney disease. -I plan to check his ferritin, iron panel, J17 and folic acid at next visit.   Follow Up Instructions: RTC first week of March with labs and injection.   I discussed the assessment and treatment plan with the patient. The patient was provided an opportunity to ask questions and all were answered. The patient agreed with the plan and demonstrated an understanding of the instructions.   The patient was advised to call back or seek an in-person evaluation if the symptoms worsen or if the condition fails to improve as anticipated.  I provided 12 minutes of non-face-to-face time during this encounter.   Derek Jack, MD

## 2019-06-13 ENCOUNTER — Ambulatory Visit (HOSPITAL_COMMUNITY): Payer: Medicare Other | Admitting: Hematology

## 2019-06-13 DIAGNOSIS — M4316 Spondylolisthesis, lumbar region: Secondary | ICD-10-CM | POA: Diagnosis not present

## 2019-06-13 DIAGNOSIS — M5417 Radiculopathy, lumbosacral region: Secondary | ICD-10-CM | POA: Diagnosis not present

## 2019-06-13 DIAGNOSIS — I5033 Acute on chronic diastolic (congestive) heart failure: Secondary | ICD-10-CM | POA: Diagnosis not present

## 2019-06-13 DIAGNOSIS — I130 Hypertensive heart and chronic kidney disease with heart failure and stage 1 through stage 4 chronic kidney disease, or unspecified chronic kidney disease: Secondary | ICD-10-CM | POA: Diagnosis not present

## 2019-06-13 DIAGNOSIS — E1122 Type 2 diabetes mellitus with diabetic chronic kidney disease: Secondary | ICD-10-CM | POA: Diagnosis not present

## 2019-06-13 DIAGNOSIS — N184 Chronic kidney disease, stage 4 (severe): Secondary | ICD-10-CM | POA: Diagnosis not present

## 2019-06-14 DIAGNOSIS — I130 Hypertensive heart and chronic kidney disease with heart failure and stage 1 through stage 4 chronic kidney disease, or unspecified chronic kidney disease: Secondary | ICD-10-CM | POA: Diagnosis not present

## 2019-06-14 DIAGNOSIS — N184 Chronic kidney disease, stage 4 (severe): Secondary | ICD-10-CM | POA: Diagnosis not present

## 2019-06-14 DIAGNOSIS — I5033 Acute on chronic diastolic (congestive) heart failure: Secondary | ICD-10-CM | POA: Diagnosis not present

## 2019-06-14 DIAGNOSIS — M4316 Spondylolisthesis, lumbar region: Secondary | ICD-10-CM | POA: Diagnosis not present

## 2019-06-14 DIAGNOSIS — M5417 Radiculopathy, lumbosacral region: Secondary | ICD-10-CM | POA: Diagnosis not present

## 2019-06-14 DIAGNOSIS — E1122 Type 2 diabetes mellitus with diabetic chronic kidney disease: Secondary | ICD-10-CM | POA: Diagnosis not present

## 2019-06-15 DIAGNOSIS — I5033 Acute on chronic diastolic (congestive) heart failure: Secondary | ICD-10-CM | POA: Diagnosis not present

## 2019-06-15 DIAGNOSIS — M4316 Spondylolisthesis, lumbar region: Secondary | ICD-10-CM | POA: Diagnosis not present

## 2019-06-15 DIAGNOSIS — E1122 Type 2 diabetes mellitus with diabetic chronic kidney disease: Secondary | ICD-10-CM | POA: Diagnosis not present

## 2019-06-15 DIAGNOSIS — I130 Hypertensive heart and chronic kidney disease with heart failure and stage 1 through stage 4 chronic kidney disease, or unspecified chronic kidney disease: Secondary | ICD-10-CM | POA: Diagnosis not present

## 2019-06-15 DIAGNOSIS — M5417 Radiculopathy, lumbosacral region: Secondary | ICD-10-CM | POA: Diagnosis not present

## 2019-06-15 DIAGNOSIS — N184 Chronic kidney disease, stage 4 (severe): Secondary | ICD-10-CM | POA: Diagnosis not present

## 2019-06-18 DIAGNOSIS — N390 Urinary tract infection, site not specified: Secondary | ICD-10-CM | POA: Diagnosis not present

## 2019-06-18 DIAGNOSIS — I130 Hypertensive heart and chronic kidney disease with heart failure and stage 1 through stage 4 chronic kidney disease, or unspecified chronic kidney disease: Secondary | ICD-10-CM | POA: Diagnosis not present

## 2019-06-18 DIAGNOSIS — M4316 Spondylolisthesis, lumbar region: Secondary | ICD-10-CM | POA: Diagnosis not present

## 2019-06-18 DIAGNOSIS — M5417 Radiculopathy, lumbosacral region: Secondary | ICD-10-CM | POA: Diagnosis not present

## 2019-06-18 DIAGNOSIS — E1122 Type 2 diabetes mellitus with diabetic chronic kidney disease: Secondary | ICD-10-CM | POA: Diagnosis not present

## 2019-06-18 DIAGNOSIS — N184 Chronic kidney disease, stage 4 (severe): Secondary | ICD-10-CM | POA: Diagnosis not present

## 2019-06-18 DIAGNOSIS — I5033 Acute on chronic diastolic (congestive) heart failure: Secondary | ICD-10-CM | POA: Diagnosis not present

## 2019-06-21 ENCOUNTER — Inpatient Hospital Stay (HOSPITAL_COMMUNITY): Payer: Medicare Other | Admitting: Genetic Counselor

## 2019-06-21 ENCOUNTER — Inpatient Hospital Stay (HOSPITAL_COMMUNITY): Payer: Medicare Other

## 2019-06-21 DIAGNOSIS — E559 Vitamin D deficiency, unspecified: Secondary | ICD-10-CM | POA: Diagnosis not present

## 2019-06-21 DIAGNOSIS — R809 Proteinuria, unspecified: Secondary | ICD-10-CM | POA: Diagnosis not present

## 2019-06-21 DIAGNOSIS — N185 Chronic kidney disease, stage 5: Secondary | ICD-10-CM | POA: Diagnosis not present

## 2019-06-21 DIAGNOSIS — D509 Iron deficiency anemia, unspecified: Secondary | ICD-10-CM | POA: Diagnosis not present

## 2019-06-21 DIAGNOSIS — Z79899 Other long term (current) drug therapy: Secondary | ICD-10-CM | POA: Diagnosis not present

## 2019-06-21 DIAGNOSIS — D631 Anemia in chronic kidney disease: Secondary | ICD-10-CM | POA: Diagnosis not present

## 2019-06-22 DIAGNOSIS — M5417 Radiculopathy, lumbosacral region: Secondary | ICD-10-CM | POA: Diagnosis not present

## 2019-06-22 DIAGNOSIS — I130 Hypertensive heart and chronic kidney disease with heart failure and stage 1 through stage 4 chronic kidney disease, or unspecified chronic kidney disease: Secondary | ICD-10-CM | POA: Diagnosis not present

## 2019-06-22 DIAGNOSIS — M4316 Spondylolisthesis, lumbar region: Secondary | ICD-10-CM | POA: Diagnosis not present

## 2019-06-22 DIAGNOSIS — I5033 Acute on chronic diastolic (congestive) heart failure: Secondary | ICD-10-CM | POA: Diagnosis not present

## 2019-06-22 DIAGNOSIS — E1122 Type 2 diabetes mellitus with diabetic chronic kidney disease: Secondary | ICD-10-CM | POA: Diagnosis not present

## 2019-06-22 DIAGNOSIS — N184 Chronic kidney disease, stage 4 (severe): Secondary | ICD-10-CM | POA: Diagnosis not present

## 2019-06-25 DIAGNOSIS — M5417 Radiculopathy, lumbosacral region: Secondary | ICD-10-CM | POA: Diagnosis not present

## 2019-06-25 DIAGNOSIS — M4316 Spondylolisthesis, lumbar region: Secondary | ICD-10-CM | POA: Diagnosis not present

## 2019-06-25 DIAGNOSIS — I5033 Acute on chronic diastolic (congestive) heart failure: Secondary | ICD-10-CM | POA: Diagnosis not present

## 2019-06-25 DIAGNOSIS — N184 Chronic kidney disease, stage 4 (severe): Secondary | ICD-10-CM | POA: Diagnosis not present

## 2019-06-25 DIAGNOSIS — E1122 Type 2 diabetes mellitus with diabetic chronic kidney disease: Secondary | ICD-10-CM | POA: Diagnosis not present

## 2019-06-25 DIAGNOSIS — I130 Hypertensive heart and chronic kidney disease with heart failure and stage 1 through stage 4 chronic kidney disease, or unspecified chronic kidney disease: Secondary | ICD-10-CM | POA: Diagnosis not present

## 2019-07-03 DIAGNOSIS — I130 Hypertensive heart and chronic kidney disease with heart failure and stage 1 through stage 4 chronic kidney disease, or unspecified chronic kidney disease: Secondary | ICD-10-CM | POA: Diagnosis not present

## 2019-07-03 DIAGNOSIS — N184 Chronic kidney disease, stage 4 (severe): Secondary | ICD-10-CM | POA: Diagnosis not present

## 2019-07-03 DIAGNOSIS — I5033 Acute on chronic diastolic (congestive) heart failure: Secondary | ICD-10-CM | POA: Diagnosis not present

## 2019-07-03 DIAGNOSIS — M5417 Radiculopathy, lumbosacral region: Secondary | ICD-10-CM | POA: Diagnosis not present

## 2019-07-03 DIAGNOSIS — M4316 Spondylolisthesis, lumbar region: Secondary | ICD-10-CM | POA: Diagnosis not present

## 2019-07-03 DIAGNOSIS — E1122 Type 2 diabetes mellitus with diabetic chronic kidney disease: Secondary | ICD-10-CM | POA: Diagnosis not present

## 2019-07-05 DIAGNOSIS — Z8546 Personal history of malignant neoplasm of prostate: Secondary | ICD-10-CM | POA: Diagnosis not present

## 2019-07-05 DIAGNOSIS — D631 Anemia in chronic kidney disease: Secondary | ICD-10-CM | POA: Diagnosis not present

## 2019-07-05 DIAGNOSIS — E1122 Type 2 diabetes mellitus with diabetic chronic kidney disease: Secondary | ICD-10-CM | POA: Diagnosis not present

## 2019-07-05 DIAGNOSIS — Z8619 Personal history of other infectious and parasitic diseases: Secondary | ICD-10-CM | POA: Diagnosis not present

## 2019-07-05 DIAGNOSIS — Z87440 Personal history of urinary (tract) infections: Secondary | ICD-10-CM | POA: Diagnosis not present

## 2019-07-05 DIAGNOSIS — M4316 Spondylolisthesis, lumbar region: Secondary | ICD-10-CM | POA: Diagnosis not present

## 2019-07-05 DIAGNOSIS — Z9181 History of falling: Secondary | ICD-10-CM | POA: Diagnosis not present

## 2019-07-05 DIAGNOSIS — M1030 Gout due to renal impairment, unspecified site: Secondary | ICD-10-CM | POA: Diagnosis not present

## 2019-07-05 DIAGNOSIS — M5417 Radiculopathy, lumbosacral region: Secondary | ICD-10-CM | POA: Diagnosis not present

## 2019-07-05 DIAGNOSIS — N184 Chronic kidney disease, stage 4 (severe): Secondary | ICD-10-CM | POA: Diagnosis not present

## 2019-07-05 DIAGNOSIS — M8580 Other specified disorders of bone density and structure, unspecified site: Secondary | ICD-10-CM | POA: Diagnosis not present

## 2019-07-05 DIAGNOSIS — I130 Hypertensive heart and chronic kidney disease with heart failure and stage 1 through stage 4 chronic kidney disease, or unspecified chronic kidney disease: Secondary | ICD-10-CM | POA: Diagnosis not present

## 2019-07-05 DIAGNOSIS — Z7984 Long term (current) use of oral hypoglycemic drugs: Secondary | ICD-10-CM | POA: Diagnosis not present

## 2019-07-05 DIAGNOSIS — I5033 Acute on chronic diastolic (congestive) heart failure: Secondary | ICD-10-CM | POA: Diagnosis not present

## 2019-07-09 DIAGNOSIS — M4316 Spondylolisthesis, lumbar region: Secondary | ICD-10-CM | POA: Diagnosis not present

## 2019-07-09 DIAGNOSIS — E1122 Type 2 diabetes mellitus with diabetic chronic kidney disease: Secondary | ICD-10-CM | POA: Diagnosis not present

## 2019-07-09 DIAGNOSIS — I5033 Acute on chronic diastolic (congestive) heart failure: Secondary | ICD-10-CM | POA: Diagnosis not present

## 2019-07-09 DIAGNOSIS — N184 Chronic kidney disease, stage 4 (severe): Secondary | ICD-10-CM | POA: Diagnosis not present

## 2019-07-09 DIAGNOSIS — M5417 Radiculopathy, lumbosacral region: Secondary | ICD-10-CM | POA: Diagnosis not present

## 2019-07-09 DIAGNOSIS — I130 Hypertensive heart and chronic kidney disease with heart failure and stage 1 through stage 4 chronic kidney disease, or unspecified chronic kidney disease: Secondary | ICD-10-CM | POA: Diagnosis not present

## 2019-07-16 DIAGNOSIS — M5417 Radiculopathy, lumbosacral region: Secondary | ICD-10-CM | POA: Diagnosis not present

## 2019-07-16 DIAGNOSIS — I130 Hypertensive heart and chronic kidney disease with heart failure and stage 1 through stage 4 chronic kidney disease, or unspecified chronic kidney disease: Secondary | ICD-10-CM | POA: Diagnosis not present

## 2019-07-16 DIAGNOSIS — E1122 Type 2 diabetes mellitus with diabetic chronic kidney disease: Secondary | ICD-10-CM | POA: Diagnosis not present

## 2019-07-16 DIAGNOSIS — I5033 Acute on chronic diastolic (congestive) heart failure: Secondary | ICD-10-CM | POA: Diagnosis not present

## 2019-07-16 DIAGNOSIS — M4316 Spondylolisthesis, lumbar region: Secondary | ICD-10-CM | POA: Diagnosis not present

## 2019-07-16 DIAGNOSIS — N184 Chronic kidney disease, stage 4 (severe): Secondary | ICD-10-CM | POA: Diagnosis not present

## 2019-07-19 DIAGNOSIS — N185 Chronic kidney disease, stage 5: Secondary | ICD-10-CM | POA: Diagnosis not present

## 2019-07-19 DIAGNOSIS — Z79899 Other long term (current) drug therapy: Secondary | ICD-10-CM | POA: Diagnosis not present

## 2019-07-19 DIAGNOSIS — D509 Iron deficiency anemia, unspecified: Secondary | ICD-10-CM | POA: Diagnosis not present

## 2019-07-19 DIAGNOSIS — D631 Anemia in chronic kidney disease: Secondary | ICD-10-CM | POA: Diagnosis not present

## 2019-07-19 DIAGNOSIS — R809 Proteinuria, unspecified: Secondary | ICD-10-CM | POA: Diagnosis not present

## 2019-07-19 DIAGNOSIS — E559 Vitamin D deficiency, unspecified: Secondary | ICD-10-CM | POA: Diagnosis not present

## 2019-07-23 DIAGNOSIS — N184 Chronic kidney disease, stage 4 (severe): Secondary | ICD-10-CM | POA: Diagnosis not present

## 2019-07-23 DIAGNOSIS — I5033 Acute on chronic diastolic (congestive) heart failure: Secondary | ICD-10-CM | POA: Diagnosis not present

## 2019-07-23 DIAGNOSIS — I130 Hypertensive heart and chronic kidney disease with heart failure and stage 1 through stage 4 chronic kidney disease, or unspecified chronic kidney disease: Secondary | ICD-10-CM | POA: Diagnosis not present

## 2019-07-23 DIAGNOSIS — M5417 Radiculopathy, lumbosacral region: Secondary | ICD-10-CM | POA: Diagnosis not present

## 2019-07-23 DIAGNOSIS — M4316 Spondylolisthesis, lumbar region: Secondary | ICD-10-CM | POA: Diagnosis not present

## 2019-07-23 DIAGNOSIS — E1122 Type 2 diabetes mellitus with diabetic chronic kidney disease: Secondary | ICD-10-CM | POA: Diagnosis not present

## 2019-08-01 DIAGNOSIS — E1140 Type 2 diabetes mellitus with diabetic neuropathy, unspecified: Secondary | ICD-10-CM | POA: Diagnosis not present

## 2019-08-01 DIAGNOSIS — E1151 Type 2 diabetes mellitus with diabetic peripheral angiopathy without gangrene: Secondary | ICD-10-CM | POA: Diagnosis not present

## 2019-08-02 DIAGNOSIS — D509 Iron deficiency anemia, unspecified: Secondary | ICD-10-CM | POA: Diagnosis not present

## 2019-08-02 DIAGNOSIS — D631 Anemia in chronic kidney disease: Secondary | ICD-10-CM | POA: Diagnosis not present

## 2019-08-02 DIAGNOSIS — N184 Chronic kidney disease, stage 4 (severe): Secondary | ICD-10-CM | POA: Diagnosis not present

## 2019-08-02 DIAGNOSIS — M4316 Spondylolisthesis, lumbar region: Secondary | ICD-10-CM | POA: Diagnosis not present

## 2019-08-02 DIAGNOSIS — R809 Proteinuria, unspecified: Secondary | ICD-10-CM | POA: Diagnosis not present

## 2019-08-02 DIAGNOSIS — E559 Vitamin D deficiency, unspecified: Secondary | ICD-10-CM | POA: Diagnosis not present

## 2019-08-02 DIAGNOSIS — N185 Chronic kidney disease, stage 5: Secondary | ICD-10-CM | POA: Diagnosis not present

## 2019-08-02 DIAGNOSIS — Z79899 Other long term (current) drug therapy: Secondary | ICD-10-CM | POA: Diagnosis not present

## 2019-08-02 DIAGNOSIS — I130 Hypertensive heart and chronic kidney disease with heart failure and stage 1 through stage 4 chronic kidney disease, or unspecified chronic kidney disease: Secondary | ICD-10-CM | POA: Diagnosis not present

## 2019-08-02 DIAGNOSIS — I5033 Acute on chronic diastolic (congestive) heart failure: Secondary | ICD-10-CM | POA: Diagnosis not present

## 2019-08-02 DIAGNOSIS — E1122 Type 2 diabetes mellitus with diabetic chronic kidney disease: Secondary | ICD-10-CM | POA: Diagnosis not present

## 2019-08-02 DIAGNOSIS — M5417 Radiculopathy, lumbosacral region: Secondary | ICD-10-CM | POA: Diagnosis not present

## 2019-08-04 DIAGNOSIS — M5417 Radiculopathy, lumbosacral region: Secondary | ICD-10-CM | POA: Diagnosis not present

## 2019-08-04 DIAGNOSIS — M1030 Gout due to renal impairment, unspecified site: Secondary | ICD-10-CM | POA: Diagnosis not present

## 2019-08-04 DIAGNOSIS — Z87440 Personal history of urinary (tract) infections: Secondary | ICD-10-CM | POA: Diagnosis not present

## 2019-08-04 DIAGNOSIS — I130 Hypertensive heart and chronic kidney disease with heart failure and stage 1 through stage 4 chronic kidney disease, or unspecified chronic kidney disease: Secondary | ICD-10-CM | POA: Diagnosis not present

## 2019-08-04 DIAGNOSIS — N184 Chronic kidney disease, stage 4 (severe): Secondary | ICD-10-CM | POA: Diagnosis not present

## 2019-08-04 DIAGNOSIS — D631 Anemia in chronic kidney disease: Secondary | ICD-10-CM | POA: Diagnosis not present

## 2019-08-04 DIAGNOSIS — Z9181 History of falling: Secondary | ICD-10-CM | POA: Diagnosis not present

## 2019-08-04 DIAGNOSIS — M8580 Other specified disorders of bone density and structure, unspecified site: Secondary | ICD-10-CM | POA: Diagnosis not present

## 2019-08-04 DIAGNOSIS — E1122 Type 2 diabetes mellitus with diabetic chronic kidney disease: Secondary | ICD-10-CM | POA: Diagnosis not present

## 2019-08-04 DIAGNOSIS — I5033 Acute on chronic diastolic (congestive) heart failure: Secondary | ICD-10-CM | POA: Diagnosis not present

## 2019-08-04 DIAGNOSIS — Z7984 Long term (current) use of oral hypoglycemic drugs: Secondary | ICD-10-CM | POA: Diagnosis not present

## 2019-08-04 DIAGNOSIS — Z8619 Personal history of other infectious and parasitic diseases: Secondary | ICD-10-CM | POA: Diagnosis not present

## 2019-08-04 DIAGNOSIS — Z8546 Personal history of malignant neoplasm of prostate: Secondary | ICD-10-CM | POA: Diagnosis not present

## 2019-08-04 DIAGNOSIS — M4316 Spondylolisthesis, lumbar region: Secondary | ICD-10-CM | POA: Diagnosis not present

## 2019-08-06 DIAGNOSIS — E1122 Type 2 diabetes mellitus with diabetic chronic kidney disease: Secondary | ICD-10-CM | POA: Diagnosis not present

## 2019-08-06 DIAGNOSIS — M4316 Spondylolisthesis, lumbar region: Secondary | ICD-10-CM | POA: Diagnosis not present

## 2019-08-06 DIAGNOSIS — I5033 Acute on chronic diastolic (congestive) heart failure: Secondary | ICD-10-CM | POA: Diagnosis not present

## 2019-08-06 DIAGNOSIS — M5417 Radiculopathy, lumbosacral region: Secondary | ICD-10-CM | POA: Diagnosis not present

## 2019-08-06 DIAGNOSIS — I130 Hypertensive heart and chronic kidney disease with heart failure and stage 1 through stage 4 chronic kidney disease, or unspecified chronic kidney disease: Secondary | ICD-10-CM | POA: Diagnosis not present

## 2019-08-06 DIAGNOSIS — N184 Chronic kidney disease, stage 4 (severe): Secondary | ICD-10-CM | POA: Diagnosis not present

## 2019-08-07 DIAGNOSIS — M5417 Radiculopathy, lumbosacral region: Secondary | ICD-10-CM | POA: Diagnosis not present

## 2019-08-07 DIAGNOSIS — I130 Hypertensive heart and chronic kidney disease with heart failure and stage 1 through stage 4 chronic kidney disease, or unspecified chronic kidney disease: Secondary | ICD-10-CM | POA: Diagnosis not present

## 2019-08-07 DIAGNOSIS — M4316 Spondylolisthesis, lumbar region: Secondary | ICD-10-CM | POA: Diagnosis not present

## 2019-08-07 DIAGNOSIS — E1122 Type 2 diabetes mellitus with diabetic chronic kidney disease: Secondary | ICD-10-CM | POA: Diagnosis not present

## 2019-08-08 DIAGNOSIS — I5033 Acute on chronic diastolic (congestive) heart failure: Secondary | ICD-10-CM | POA: Diagnosis not present

## 2019-08-08 DIAGNOSIS — M5417 Radiculopathy, lumbosacral region: Secondary | ICD-10-CM | POA: Diagnosis not present

## 2019-08-08 DIAGNOSIS — M4316 Spondylolisthesis, lumbar region: Secondary | ICD-10-CM | POA: Diagnosis not present

## 2019-08-08 DIAGNOSIS — E1122 Type 2 diabetes mellitus with diabetic chronic kidney disease: Secondary | ICD-10-CM | POA: Diagnosis not present

## 2019-08-08 DIAGNOSIS — I130 Hypertensive heart and chronic kidney disease with heart failure and stage 1 through stage 4 chronic kidney disease, or unspecified chronic kidney disease: Secondary | ICD-10-CM | POA: Diagnosis not present

## 2019-08-08 DIAGNOSIS — N184 Chronic kidney disease, stage 4 (severe): Secondary | ICD-10-CM | POA: Diagnosis not present

## 2019-08-13 DIAGNOSIS — C61 Malignant neoplasm of prostate: Secondary | ICD-10-CM | POA: Diagnosis not present

## 2019-08-13 DIAGNOSIS — Z789 Other specified health status: Secondary | ICD-10-CM | POA: Diagnosis not present

## 2019-08-13 DIAGNOSIS — N184 Chronic kidney disease, stage 4 (severe): Secondary | ICD-10-CM | POA: Diagnosis not present

## 2019-08-13 DIAGNOSIS — I723 Aneurysm of iliac artery: Secondary | ICD-10-CM | POA: Diagnosis not present

## 2019-08-13 DIAGNOSIS — K921 Melena: Secondary | ICD-10-CM | POA: Diagnosis not present

## 2019-08-13 DIAGNOSIS — Z299 Encounter for prophylactic measures, unspecified: Secondary | ICD-10-CM | POA: Diagnosis not present

## 2019-08-16 DIAGNOSIS — D509 Iron deficiency anemia, unspecified: Secondary | ICD-10-CM | POA: Diagnosis not present

## 2019-08-16 DIAGNOSIS — E1122 Type 2 diabetes mellitus with diabetic chronic kidney disease: Secondary | ICD-10-CM | POA: Diagnosis not present

## 2019-08-16 DIAGNOSIS — Z79899 Other long term (current) drug therapy: Secondary | ICD-10-CM | POA: Diagnosis not present

## 2019-08-16 DIAGNOSIS — M4316 Spondylolisthesis, lumbar region: Secondary | ICD-10-CM | POA: Diagnosis not present

## 2019-08-16 DIAGNOSIS — R809 Proteinuria, unspecified: Secondary | ICD-10-CM | POA: Diagnosis not present

## 2019-08-16 DIAGNOSIS — N185 Chronic kidney disease, stage 5: Secondary | ICD-10-CM | POA: Diagnosis not present

## 2019-08-16 DIAGNOSIS — E559 Vitamin D deficiency, unspecified: Secondary | ICD-10-CM | POA: Diagnosis not present

## 2019-08-16 DIAGNOSIS — I130 Hypertensive heart and chronic kidney disease with heart failure and stage 1 through stage 4 chronic kidney disease, or unspecified chronic kidney disease: Secondary | ICD-10-CM | POA: Diagnosis not present

## 2019-08-16 DIAGNOSIS — M5417 Radiculopathy, lumbosacral region: Secondary | ICD-10-CM | POA: Diagnosis not present

## 2019-08-16 DIAGNOSIS — D631 Anemia in chronic kidney disease: Secondary | ICD-10-CM | POA: Diagnosis not present

## 2019-08-16 DIAGNOSIS — N184 Chronic kidney disease, stage 4 (severe): Secondary | ICD-10-CM | POA: Diagnosis not present

## 2019-08-16 DIAGNOSIS — I5033 Acute on chronic diastolic (congestive) heart failure: Secondary | ICD-10-CM | POA: Diagnosis not present

## 2019-08-18 DIAGNOSIS — R531 Weakness: Secondary | ICD-10-CM | POA: Diagnosis not present

## 2019-08-18 DIAGNOSIS — R0689 Other abnormalities of breathing: Secondary | ICD-10-CM | POA: Diagnosis not present

## 2019-08-20 DIAGNOSIS — M5417 Radiculopathy, lumbosacral region: Secondary | ICD-10-CM | POA: Diagnosis not present

## 2019-08-20 DIAGNOSIS — I130 Hypertensive heart and chronic kidney disease with heart failure and stage 1 through stage 4 chronic kidney disease, or unspecified chronic kidney disease: Secondary | ICD-10-CM | POA: Diagnosis not present

## 2019-08-20 DIAGNOSIS — E1122 Type 2 diabetes mellitus with diabetic chronic kidney disease: Secondary | ICD-10-CM | POA: Diagnosis not present

## 2019-08-20 DIAGNOSIS — N184 Chronic kidney disease, stage 4 (severe): Secondary | ICD-10-CM | POA: Diagnosis not present

## 2019-08-20 DIAGNOSIS — M4316 Spondylolisthesis, lumbar region: Secondary | ICD-10-CM | POA: Diagnosis not present

## 2019-08-20 DIAGNOSIS — I5033 Acute on chronic diastolic (congestive) heart failure: Secondary | ICD-10-CM | POA: Diagnosis not present

## 2019-08-23 DIAGNOSIS — Z23 Encounter for immunization: Secondary | ICD-10-CM | POA: Diagnosis not present

## 2019-08-28 DIAGNOSIS — M4316 Spondylolisthesis, lumbar region: Secondary | ICD-10-CM | POA: Diagnosis not present

## 2019-08-28 DIAGNOSIS — M5417 Radiculopathy, lumbosacral region: Secondary | ICD-10-CM | POA: Diagnosis not present

## 2019-08-28 DIAGNOSIS — E1122 Type 2 diabetes mellitus with diabetic chronic kidney disease: Secondary | ICD-10-CM | POA: Diagnosis not present

## 2019-08-28 DIAGNOSIS — I130 Hypertensive heart and chronic kidney disease with heart failure and stage 1 through stage 4 chronic kidney disease, or unspecified chronic kidney disease: Secondary | ICD-10-CM | POA: Diagnosis not present

## 2019-08-28 DIAGNOSIS — I5033 Acute on chronic diastolic (congestive) heart failure: Secondary | ICD-10-CM | POA: Diagnosis not present

## 2019-08-28 DIAGNOSIS — N184 Chronic kidney disease, stage 4 (severe): Secondary | ICD-10-CM | POA: Diagnosis not present

## 2019-08-31 DIAGNOSIS — Z79899 Other long term (current) drug therapy: Secondary | ICD-10-CM | POA: Diagnosis not present

## 2019-08-31 DIAGNOSIS — E559 Vitamin D deficiency, unspecified: Secondary | ICD-10-CM | POA: Diagnosis not present

## 2019-08-31 DIAGNOSIS — N184 Chronic kidney disease, stage 4 (severe): Secondary | ICD-10-CM | POA: Diagnosis not present

## 2019-08-31 DIAGNOSIS — D509 Iron deficiency anemia, unspecified: Secondary | ICD-10-CM | POA: Diagnosis not present

## 2019-08-31 DIAGNOSIS — M5417 Radiculopathy, lumbosacral region: Secondary | ICD-10-CM | POA: Diagnosis not present

## 2019-08-31 DIAGNOSIS — E1122 Type 2 diabetes mellitus with diabetic chronic kidney disease: Secondary | ICD-10-CM | POA: Diagnosis not present

## 2019-08-31 DIAGNOSIS — D631 Anemia in chronic kidney disease: Secondary | ICD-10-CM | POA: Diagnosis not present

## 2019-08-31 DIAGNOSIS — M4316 Spondylolisthesis, lumbar region: Secondary | ICD-10-CM | POA: Diagnosis not present

## 2019-08-31 DIAGNOSIS — I5033 Acute on chronic diastolic (congestive) heart failure: Secondary | ICD-10-CM | POA: Diagnosis not present

## 2019-08-31 DIAGNOSIS — N185 Chronic kidney disease, stage 5: Secondary | ICD-10-CM | POA: Diagnosis not present

## 2019-08-31 DIAGNOSIS — R809 Proteinuria, unspecified: Secondary | ICD-10-CM | POA: Diagnosis not present

## 2019-08-31 DIAGNOSIS — I130 Hypertensive heart and chronic kidney disease with heart failure and stage 1 through stage 4 chronic kidney disease, or unspecified chronic kidney disease: Secondary | ICD-10-CM | POA: Diagnosis not present

## 2019-09-03 DIAGNOSIS — M4316 Spondylolisthesis, lumbar region: Secondary | ICD-10-CM | POA: Diagnosis not present

## 2019-09-03 DIAGNOSIS — I5033 Acute on chronic diastolic (congestive) heart failure: Secondary | ICD-10-CM | POA: Diagnosis not present

## 2019-09-03 DIAGNOSIS — D631 Anemia in chronic kidney disease: Secondary | ICD-10-CM | POA: Diagnosis not present

## 2019-09-03 DIAGNOSIS — N184 Chronic kidney disease, stage 4 (severe): Secondary | ICD-10-CM | POA: Diagnosis not present

## 2019-09-03 DIAGNOSIS — Z8546 Personal history of malignant neoplasm of prostate: Secondary | ICD-10-CM | POA: Diagnosis not present

## 2019-09-03 DIAGNOSIS — Z7984 Long term (current) use of oral hypoglycemic drugs: Secondary | ICD-10-CM | POA: Diagnosis not present

## 2019-09-03 DIAGNOSIS — M5417 Radiculopathy, lumbosacral region: Secondary | ICD-10-CM | POA: Diagnosis not present

## 2019-09-03 DIAGNOSIS — M5407 Panniculitis affecting regions of neck and back, lumbosacral region: Secondary | ICD-10-CM | POA: Diagnosis not present

## 2019-09-03 DIAGNOSIS — E1122 Type 2 diabetes mellitus with diabetic chronic kidney disease: Secondary | ICD-10-CM | POA: Diagnosis not present

## 2019-09-03 DIAGNOSIS — Z8619 Personal history of other infectious and parasitic diseases: Secondary | ICD-10-CM | POA: Diagnosis not present

## 2019-09-03 DIAGNOSIS — I130 Hypertensive heart and chronic kidney disease with heart failure and stage 1 through stage 4 chronic kidney disease, or unspecified chronic kidney disease: Secondary | ICD-10-CM | POA: Diagnosis not present

## 2019-09-03 DIAGNOSIS — M8580 Other specified disorders of bone density and structure, unspecified site: Secondary | ICD-10-CM | POA: Diagnosis not present

## 2019-09-03 DIAGNOSIS — M1030 Gout due to renal impairment, unspecified site: Secondary | ICD-10-CM | POA: Diagnosis not present

## 2019-09-03 DIAGNOSIS — Z9181 History of falling: Secondary | ICD-10-CM | POA: Diagnosis not present

## 2019-09-03 DIAGNOSIS — L89152 Pressure ulcer of sacral region, stage 2: Secondary | ICD-10-CM | POA: Diagnosis not present

## 2019-09-03 DIAGNOSIS — Z87440 Personal history of urinary (tract) infections: Secondary | ICD-10-CM | POA: Diagnosis not present

## 2019-09-05 DIAGNOSIS — Z6829 Body mass index (BMI) 29.0-29.9, adult: Secondary | ICD-10-CM | POA: Diagnosis not present

## 2019-09-05 DIAGNOSIS — Z299 Encounter for prophylactic measures, unspecified: Secondary | ICD-10-CM | POA: Diagnosis not present

## 2019-09-05 DIAGNOSIS — I723 Aneurysm of iliac artery: Secondary | ICD-10-CM | POA: Diagnosis not present

## 2019-09-05 DIAGNOSIS — E1122 Type 2 diabetes mellitus with diabetic chronic kidney disease: Secondary | ICD-10-CM | POA: Diagnosis not present

## 2019-09-05 DIAGNOSIS — N184 Chronic kidney disease, stage 4 (severe): Secondary | ICD-10-CM | POA: Diagnosis not present

## 2019-09-05 DIAGNOSIS — E1165 Type 2 diabetes mellitus with hyperglycemia: Secondary | ICD-10-CM | POA: Diagnosis not present

## 2019-09-07 DIAGNOSIS — M4316 Spondylolisthesis, lumbar region: Secondary | ICD-10-CM | POA: Diagnosis not present

## 2019-09-07 DIAGNOSIS — E1122 Type 2 diabetes mellitus with diabetic chronic kidney disease: Secondary | ICD-10-CM | POA: Diagnosis not present

## 2019-09-07 DIAGNOSIS — L89152 Pressure ulcer of sacral region, stage 2: Secondary | ICD-10-CM | POA: Diagnosis not present

## 2019-09-07 DIAGNOSIS — I5033 Acute on chronic diastolic (congestive) heart failure: Secondary | ICD-10-CM | POA: Diagnosis not present

## 2019-09-07 DIAGNOSIS — M5417 Radiculopathy, lumbosacral region: Secondary | ICD-10-CM | POA: Diagnosis not present

## 2019-09-07 DIAGNOSIS — I130 Hypertensive heart and chronic kidney disease with heart failure and stage 1 through stage 4 chronic kidney disease, or unspecified chronic kidney disease: Secondary | ICD-10-CM | POA: Diagnosis not present

## 2019-09-12 DIAGNOSIS — L89152 Pressure ulcer of sacral region, stage 2: Secondary | ICD-10-CM | POA: Diagnosis not present

## 2019-09-12 DIAGNOSIS — M5417 Radiculopathy, lumbosacral region: Secondary | ICD-10-CM | POA: Diagnosis not present

## 2019-09-12 DIAGNOSIS — I5033 Acute on chronic diastolic (congestive) heart failure: Secondary | ICD-10-CM | POA: Diagnosis not present

## 2019-09-12 DIAGNOSIS — I130 Hypertensive heart and chronic kidney disease with heart failure and stage 1 through stage 4 chronic kidney disease, or unspecified chronic kidney disease: Secondary | ICD-10-CM | POA: Diagnosis not present

## 2019-09-12 DIAGNOSIS — M4316 Spondylolisthesis, lumbar region: Secondary | ICD-10-CM | POA: Diagnosis not present

## 2019-09-12 DIAGNOSIS — E1122 Type 2 diabetes mellitus with diabetic chronic kidney disease: Secondary | ICD-10-CM | POA: Diagnosis not present

## 2019-09-13 ENCOUNTER — Inpatient Hospital Stay (HOSPITAL_COMMUNITY): Payer: No Typology Code available for payment source

## 2019-09-13 ENCOUNTER — Inpatient Hospital Stay (HOSPITAL_COMMUNITY): Payer: No Typology Code available for payment source | Attending: Hematology

## 2019-09-13 ENCOUNTER — Other Ambulatory Visit: Payer: Self-pay

## 2019-09-13 ENCOUNTER — Inpatient Hospital Stay (HOSPITAL_BASED_OUTPATIENT_CLINIC_OR_DEPARTMENT_OTHER): Payer: No Typology Code available for payment source | Admitting: Hematology

## 2019-09-13 VITALS — BP 124/68 | HR 86 | Temp 97.8°F | Resp 16

## 2019-09-13 DIAGNOSIS — D631 Anemia in chronic kidney disease: Secondary | ICD-10-CM | POA: Diagnosis not present

## 2019-09-13 DIAGNOSIS — C61 Malignant neoplasm of prostate: Secondary | ICD-10-CM

## 2019-09-13 DIAGNOSIS — Z809 Family history of malignant neoplasm, unspecified: Secondary | ICD-10-CM | POA: Insufficient documentation

## 2019-09-13 DIAGNOSIS — C7951 Secondary malignant neoplasm of bone: Secondary | ICD-10-CM | POA: Insufficient documentation

## 2019-09-13 DIAGNOSIS — E1129 Type 2 diabetes mellitus with other diabetic kidney complication: Secondary | ICD-10-CM | POA: Diagnosis not present

## 2019-09-13 DIAGNOSIS — N185 Chronic kidney disease, stage 5: Secondary | ICD-10-CM | POA: Diagnosis not present

## 2019-09-13 DIAGNOSIS — Z8349 Family history of other endocrine, nutritional and metabolic diseases: Secondary | ICD-10-CM | POA: Insufficient documentation

## 2019-09-13 DIAGNOSIS — Z79899 Other long term (current) drug therapy: Secondary | ICD-10-CM | POA: Insufficient documentation

## 2019-09-13 DIAGNOSIS — Z808 Family history of malignant neoplasm of other organs or systems: Secondary | ICD-10-CM | POA: Insufficient documentation

## 2019-09-13 DIAGNOSIS — D539 Nutritional anemia, unspecified: Secondary | ICD-10-CM | POA: Insufficient documentation

## 2019-09-13 DIAGNOSIS — E559 Vitamin D deficiency, unspecified: Secondary | ICD-10-CM | POA: Diagnosis not present

## 2019-09-13 DIAGNOSIS — R809 Proteinuria, unspecified: Secondary | ICD-10-CM | POA: Diagnosis not present

## 2019-09-13 DIAGNOSIS — Z833 Family history of diabetes mellitus: Secondary | ICD-10-CM | POA: Insufficient documentation

## 2019-09-13 DIAGNOSIS — M7989 Other specified soft tissue disorders: Secondary | ICD-10-CM | POA: Diagnosis not present

## 2019-09-13 DIAGNOSIS — E1122 Type 2 diabetes mellitus with diabetic chronic kidney disease: Secondary | ICD-10-CM | POA: Insufficient documentation

## 2019-09-13 DIAGNOSIS — N183 Chronic kidney disease, stage 3 unspecified: Secondary | ICD-10-CM | POA: Diagnosis not present

## 2019-09-13 DIAGNOSIS — Z8673 Personal history of transient ischemic attack (TIA), and cerebral infarction without residual deficits: Secondary | ICD-10-CM | POA: Insufficient documentation

## 2019-09-13 DIAGNOSIS — Z5111 Encounter for antineoplastic chemotherapy: Secondary | ICD-10-CM | POA: Diagnosis not present

## 2019-09-13 DIAGNOSIS — D509 Iron deficiency anemia, unspecified: Secondary | ICD-10-CM | POA: Diagnosis not present

## 2019-09-13 LAB — CBC WITH DIFFERENTIAL/PLATELET
Abs Immature Granulocytes: 0.04 10*3/uL (ref 0.00–0.07)
Basophils Absolute: 0 10*3/uL (ref 0.0–0.1)
Basophils Relative: 1 %
Eosinophils Absolute: 0.3 10*3/uL (ref 0.0–0.5)
Eosinophils Relative: 6 %
HCT: 32.7 % — ABNORMAL LOW (ref 39.0–52.0)
Hemoglobin: 9.8 g/dL — ABNORMAL LOW (ref 13.0–17.0)
Immature Granulocytes: 1 %
Lymphocytes Relative: 19 %
Lymphs Abs: 1.1 10*3/uL (ref 0.7–4.0)
MCH: 30.3 pg (ref 26.0–34.0)
MCHC: 30 g/dL (ref 30.0–36.0)
MCV: 101.2 fL — ABNORMAL HIGH (ref 80.0–100.0)
Monocytes Absolute: 0.6 10*3/uL (ref 0.1–1.0)
Monocytes Relative: 11 %
Neutro Abs: 3.6 10*3/uL (ref 1.7–7.7)
Neutrophils Relative %: 62 %
Platelets: 160 10*3/uL (ref 150–400)
RBC: 3.23 MIL/uL — ABNORMAL LOW (ref 4.22–5.81)
RDW: 16 % — ABNORMAL HIGH (ref 11.5–15.5)
WBC: 5.6 10*3/uL (ref 4.0–10.5)
nRBC: 0 % (ref 0.0–0.2)

## 2019-09-13 LAB — IRON AND TIBC
Iron: 65 ug/dL (ref 45–182)
Saturation Ratios: 24 % (ref 17.9–39.5)
TIBC: 273 ug/dL (ref 250–450)
UIBC: 208 ug/dL

## 2019-09-13 LAB — COMPREHENSIVE METABOLIC PANEL
ALT: 8 U/L (ref 0–44)
AST: 17 U/L (ref 15–41)
Albumin: 3.9 g/dL (ref 3.5–5.0)
Alkaline Phosphatase: 51 U/L (ref 38–126)
Anion gap: 12 (ref 5–15)
BUN: 69 mg/dL — ABNORMAL HIGH (ref 8–23)
CO2: 25 mmol/L (ref 22–32)
Calcium: 9.3 mg/dL (ref 8.9–10.3)
Chloride: 101 mmol/L (ref 98–111)
Creatinine, Ser: 5.23 mg/dL — ABNORMAL HIGH (ref 0.61–1.24)
GFR calc Af Amer: 10 mL/min — ABNORMAL LOW (ref >60)
GFR calc non Af Amer: 8 mL/min — ABNORMAL LOW (ref >60)
Glucose, Bld: 176 mg/dL — ABNORMAL HIGH (ref 70–99)
Potassium: 4.6 mmol/L (ref 3.5–5.1)
Sodium: 138 mmol/L (ref 135–145)
Total Bilirubin: 0.6 mg/dL (ref 0.3–1.2)
Total Protein: 7.7 g/dL (ref 6.5–8.1)

## 2019-09-13 LAB — FOLATE: Folate: 12 ng/mL (ref >5.9)

## 2019-09-13 LAB — VITAMIN B12: Vitamin B-12: 584 pg/mL (ref 180–914)

## 2019-09-13 LAB — FERRITIN: Ferritin: 227 ng/mL (ref 24–336)

## 2019-09-13 MED ORDER — LEUPROLIDE ACETATE (6 MONTH) 45 MG ~~LOC~~ KIT
45.0000 mg | PACK | Freq: Once | SUBCUTANEOUS | Status: AC
  Administered 2019-09-13: 45 mg via SUBCUTANEOUS
  Filled 2019-09-13: qty 45

## 2019-09-13 MED ORDER — LEUPROLIDE ACETATE (6 MONTH) 45 MG IM KIT: 45.0000 mg | PACK | Freq: Once | INTRAMUSCULAR | Status: DC

## 2019-09-13 NOTE — Patient Instructions (Signed)
[facility] at [facility]  Discharge Instructions:   _______________________________________________________________  Thank you for choosing [facility] at [facility] to provide your oncology and hematology care.  To afford each patient quality time with our providers, please arrive at least 15 minutes before your scheduled appointment.  You need to re-schedule your appointment if you arrive 10 or more minutes late.  We strive to give you quality time with our providers, and arriving late affects you and other patients whose appointments are after yours.  Also, if you no show three or more times for appointments you may be dismissed from the clinic.  Again, thank you for choosing [facility] at [facility]. Our hope is that these requests will allow you access to exceptional care and in a timely manner. _______________________________________________________________  If you have questions after your visit, please contact our office at [phone] between the hours of 8:30 a.m. and 5:00 p.m. Voicemails left after 4:30 p.m. will not be returned until the following business day. _______________________________________________________________  For prescription refill requests, have your pharmacy contact our office. _______________________________________________________________  Recommendations made by the consultant and any test results will be sent to your referring physician. _______________________________________________________________ 

## 2019-09-13 NOTE — Progress Notes (Signed)
Cameron Phelps, [place] [address]   CLINIC:  Medical Oncology/Hematology  PCP:  Glenda Chroman, MD [place]  [phone]   REASON FOR VISIT:  Follow-up for metastatic prostate cancer to the bones, macrocytic anemia.  CURRENT THERAPY: Lupron injection every 6 months.    INTERVAL HISTORY:  Cameron Phelps 86 y.o. male seen for follow-up of metastatic prostate cancer to bones and macrocytic anemia.  He is accompanied by his daughter today.  Reports appetite and energy levels are 75%.  He is able to walk with the help of walker.  He is able to do all his ADLs.  Denies any bleeding per rectum or melena.  Reportedly insulin was discontinued 2 weeks ago.  Denies any new onset bone pains.    REVIEW OF SYSTEMS:  Review of Systems  Cardiovascular: Positive for leg swelling.  All other systems reviewed and are negative.    PAST MEDICAL/SURGICAL HISTORY:  Past Medical History:  Diagnosis Date  . Anemia   . Arthritis    gout  . Cancer [facility])    Prostate  . Chronic back pain   . Chronic kidney disease   . Diabetes mellitus without complication ([place])   . Gout   . Hypothyroidism   . Leg edema   . Metabolic bone disease   . Pancreatic cancer ([place])   . PONV (postoperative nausea and vomiting)   . Proteinuria   . Stroke [facility])    residual left -sided weakness   Past Surgical History:  Procedure Laterality Date  . AV FISTULA PLACEMENT Left 04/08/2014   Procedure: ARTERIOVENOUS (AV) FISTULA CREATION;  Surgeon: Cameron [place], MD;  Location: [place];  Service: Vascular;  Laterality: Left;  . BASCILIC VEIN TRANSPOSITION Left 07/31/2014   Procedure: SECOND STAGE BASILIC VEIN TRANSPOSITION LEFT ARM;  Surgeon: Cameron Orrum, MD;  Location: [place];  Service: Vascular;  Laterality: Left;  . CATARACT EXTRACTION    . COLONOSCOPY W/ BIOPSIES AND POLYPECTOMY    . ESOPHAGOGASTRODUODENOSCOPY (EGD) WITH PROPOFOL N/A 09/24/2016   Procedure:  ESOPHAGOGASTRODUODENOSCOPY (EGD) WITH PROPOFOL;  Surgeon: Cameron Brace, MD;  Location: [place];  Service: Gastroenterology;  Laterality: N/A;     SOCIAL HISTORY:  Social History   Socioeconomic History  . Marital status: Widowed    Spouse name: Not on file  . Number of children: 2  . Years of education: Not on file  . Highest education level: Not on file  Occupational History  . Not on file  Tobacco Use  . Smoking status: Never Smoker  . Smokeless tobacco: Never Used  Substance and Sexual Activity  . Alcohol use: No    Alcohol/week: 0.0 standard drinks  . Drug use: No  . Sexual activity: Not on file  Other Topics Concern  . Not on file  Social History Narrative  . Not on file   Social Determinants of Health   Financial Resource Strain: Low Risk   . Difficulty of Paying Living Expenses: Not hard at all  Food Insecurity: No Food Insecurity  . Worried About Charity fundraiser in the Last Year: Never true  . Ran Out of Food in the Last Year: Never true  Transportation Needs: No Transportation Needs  . Lack of Transportation (Medical): No  . Lack of Transportation (Non-Medical): No  Physical Activity: Insufficiently Active  . Days of Exercise per Week: 1 day  . Minutes of Exercise per Session: 10 min  Stress: No Stress Concern Present  . Feeling of Stress : Not at all  Social Connections: Somewhat Isolated  . Frequency of Communication with Friends and Family: More than three times a week  . Frequency of Social Gatherings with Friends and Family: More than three times a week  . Attends Religious Services: 1 to 4 times per year  . Active Member of Clubs or Organizations: No  . Attends Archivist Meetings: Never  . Marital Status: Widowed  Intimate Partner Violence: Not At Risk  . Fear of Current or Ex-Partner: No  . Emotionally Abused: No  . Physically Abused: No  . Sexually Abused: No    FAMILY HISTORY:  Family History  Problem Relation Age of  Onset  . Diabetes Mother   . Cancer Daughter   . Diabetes Daughter   . Hyperlipidemia Daughter   . Brain cancer Father   . Diabetes Daughter     CURRENT MEDICATIONS:  Outpatient Encounter Medications as of 09/13/2019  Medication Sig Note  . acetaminophen (TYLENOL) 500 MG tablet Take 500 mg by mouth 2 (two) times daily.   Marland Kitchen allopurinol (ZYLOPRIM) 100 MG tablet Take 100 mg by mouth daily.   Marland Kitchen CINNAMON PO Take by mouth 2 (two) times daily.   Marland Kitchen docusate sodium (COLACE) 100 MG capsule Take 100 mg by mouth 2 (two) times daily.   Marland Kitchen epoetin alfa-epbx (RETACRIT) 70177 UNIT/ML injection Inject as directed PT RECEIVING 12,000 UNITS Q 2 WEEKS   . levothyroxine (SYNTHROID, LEVOTHROID) 88 MCG tablet Take 88 mcg by mouth daily before breakfast.   . pantoprazole (PROTONIX) 20 MG tablet Take 1 tablet (20 mg total) by mouth daily. (Patient taking differently: Take 40 mg by mouth daily. )   . pioglitazone (ACTOS) 30 MG tablet Take 45 mg by mouth daily.    Marland Kitchen senna (SENOKOT) 8.6 MG tablet Take 1 tablet by mouth 2 (two) times daily.   . sodium bicarbonate 650 MG tablet Take 650 mg by mouth 3 (three) times daily.   Marland Kitchen torsemide (DEMADEX) 20 MG tablet Take 20 mg by mouth daily. 07/30/2014: Received from: External Pharmacy Received Sig:   . [DISCONTINUED] Calcium Carbonate-Vitamin D 600-400 MG-UNIT tablet Take by mouth.   . [DISCONTINUED] Insulin Degludec 100 UNIT/ML SOLN Inject into the skin.    No facility-administered encounter medications on file as of 09/13/2019.    ALLERGIES:  No Known Allergies   PHYSICAL EXAM:  ECOG Performance status: 2  Vitals:   09/13/19 1448  BP: 124/68  Pulse: 86  Resp: 16  Temp: 97.8 F (36.6 C)  SpO2: 100%   There were no vitals filed for this visit.  Physical Exam Vitals reviewed.  Constitutional:      Appearance: Normal appearance.  Cardiovascular:     Rate and Rhythm: Normal rate and regular rhythm.     Heart sounds: Normal heart sounds.  Pulmonary:      Effort: Pulmonary effort is normal.     Breath sounds: Normal breath sounds.  Abdominal:     General: There is no distension.     Palpations: Abdomen is soft.  Musculoskeletal:     Right lower leg: Edema present.     Left lower leg: Edema present.  Lymphadenopathy:     Cervical: No cervical adenopathy.  Neurological:     Mental Status: He is alert and oriented to person, place, and time.  Psychiatric:        Mood and Affect: Mood normal.  Behavior: Behavior normal.      LABORATORY DATA:  I have reviewed the labs as listed.  CBC    Component Value Date/Time   WBC 5.6 09/13/2019 1304   RBC 3.23 (L) 09/13/2019 1304   HGB 9.8 (L) 09/13/2019 1304   HCT 32.7 (L) 09/13/2019 1304   PLT 160 09/13/2019 1304   MCV 101.2 (H) 09/13/2019 1304   MCH 30.3 09/13/2019 1304   MCHC 30.0 09/13/2019 1304   RDW 16.0 (H) 09/13/2019 1304   LYMPHSABS 1.1 09/13/2019 1304   MONOABS 0.6 09/13/2019 1304   EOSABS 0.3 09/13/2019 1304   BASOSABS 0.0 09/13/2019 1304   CMP Latest Ref Rng & Units 09/13/2019 05/25/2019 09/24/2016  Glucose 70 - 99 mg/dL 176(H) 169(H) 166(H)  BUN 8 - 23 mg/dL 69(H) 81(H) 83(H)  Creatinine 0.61 - 1.24 mg/dL 5.23(H) 5.57(H) 4.68(H)  Sodium 135 - 145 mmol/L 138 140 140  Potassium 3.5 - 5.1 mmol/L 4.6 4.4 3.7  Chloride 98 - 111 mmol/L 101 103 95(L)  CO2 22 - 32 mmol/L 25 24 29   Calcium 8.9 - 10.3 mg/dL 9.3 9.2 9.8  Total Protein 6.5 - 8.1 g/dL 7.7 7.7 -  Total Bilirubin 0.3 - 1.2 mg/dL 0.6 0.6 -  Alkaline Phos 38 - 126 U/L 51 53 -  AST 15 - 41 U/L 17 20 -  ALT 0 - 44 U/L 8 10 -       DIAGNOSTIC IMAGING:  I have independently reviewed the scans and discussed with the patient.     ASSESSMENT & PLAN:   Prostate cancer ([place]) 1.  Metastatic prostate cancer to the bones: -Presentation with PSA of 861 in July 2016.  He was started on Eligard every 6 months at [facility]. -Bicalutamide 50 mg discontinued around 03/26/2019 as his PSA was rising. -Last PSA  in [place] was 31.3 on 03/22/2019. -PSA at our clinic on 05/25/2019 was 10.9 with testosterone less than 3. -Bone scan on 05/25/2019 did not show any evidence of metastatic disease. -We reviewed blood work from today.  PSA was down to 9.0. -He will receive Lupron 45 mg today.  I will reevaluate him in 3 months. -We will consider genetic mutation testing in the future.  2.  Macrocytic anemia: -This is likely from combination of CKD and early MDS. -Hemoglobin today is 9.8. -Ferritin was 227 and percent saturation was 24 on 09/13/2019.  B12 was 584. -He is receiving Procrit every other Thursday with Dr. Theador Hawthorne.  3.  Bone strengthening agents: -Because of CKD and old age, we are not treating with bisphosphonates.  4.  Diabetes: -As his A1c was normal, insulin was reportedly discontinued 2 weeks ago.      Orders placed this encounter:  Orders Placed This Encounter  Procedures  . CBC with Differential/Platelet  . Comprehensive metabolic panel  . PSA      Derek Jack, MD [place] [phone]

## 2019-09-13 NOTE — Patient Instructions (Addendum)
[place] at [facility] Discharge Instructions  You were seen today by Dr. Delton Coombes. He went over your recent lab results. Continue Lupron injections. He will see you back in 3 months for labs and follow up.   Thank you for choosing [place] at [facility] to provide your oncology and hematology care.  To afford each patient quality time with our provider, please arrive at least 15 minutes before your scheduled appointment time.   If you have a lab appointment with the [facility] please come in thru the  Main Entrance and check in at the main information desk  You need to re-schedule your appointment should you arrive 10 or more minutes late.  We strive to give you quality time with our providers, and arriving late affects you and other patients whose appointments are after yours.  Also, if you no show three or more times for appointments you may be dismissed from the clinic at the providers discretion.     Again, thank you for choosing [facility].  Our hope is that these requests will decrease the amount of time that you wait before being seen by our physicians.       _____________________________________________________________  Should you have questions after your visit to [facility], please contact our office at [phone] between the hours of 8:00 a.m. and 4:30 p.m.  Voicemails left after 4:00 p.m. will not be returned until the following business day.  For prescription refill requests, have your pharmacy contact our office and allow 72 hours.    Cancer Center Support Programs:   > Cancer Support Group  2nd Tuesday of the month 1pm-2pm, Journey Room

## 2019-09-13 NOTE — Progress Notes (Signed)
Dayton Scrape presents today for injection per MD orders. Eligard administered SQ in left Abdomen. Administration without incident. Patient tolerated well.  No complaints at this time. Discharged from clinic ambulatory. F/U with [facility] as scheduled.

## 2019-09-14 DIAGNOSIS — M5417 Radiculopathy, lumbosacral region: Secondary | ICD-10-CM | POA: Diagnosis not present

## 2019-09-14 DIAGNOSIS — L89152 Pressure ulcer of sacral region, stage 2: Secondary | ICD-10-CM | POA: Diagnosis not present

## 2019-09-14 DIAGNOSIS — M4316 Spondylolisthesis, lumbar region: Secondary | ICD-10-CM | POA: Diagnosis not present

## 2019-09-14 DIAGNOSIS — I5033 Acute on chronic diastolic (congestive) heart failure: Secondary | ICD-10-CM | POA: Diagnosis not present

## 2019-09-14 DIAGNOSIS — I130 Hypertensive heart and chronic kidney disease with heart failure and stage 1 through stage 4 chronic kidney disease, or unspecified chronic kidney disease: Secondary | ICD-10-CM | POA: Diagnosis not present

## 2019-09-14 DIAGNOSIS — E1122 Type 2 diabetes mellitus with diabetic chronic kidney disease: Secondary | ICD-10-CM | POA: Diagnosis not present

## 2019-09-14 LAB — PSA: Prostatic Specific Antigen: 9.06 ng/mL — ABNORMAL HIGH (ref 0.00–4.00)

## 2019-09-15 NOTE — Assessment & Plan Note (Signed)
1.  Metastatic prostate cancer to the bones: -Presentation with PSA of 861 in July 2016.  He was started on Eligard every 6 months at [facility]. -Bicalutamide 50 mg discontinued around 03/26/2019 as his PSA was rising. -Last PSA in [place] was 31.3 on 03/22/2019. -PSA at our clinic on 05/25/2019 was 10.9 with testosterone less than 3. -Bone scan on 05/25/2019 did not show any evidence of metastatic disease. -We reviewed blood work from today.  PSA was down to 9.0. -He will receive Lupron 45 mg today.  I will reevaluate him in 3 months. -We will consider genetic mutation testing in the future.  2.  Macrocytic anemia: -This is likely from combination of CKD and early MDS. -Hemoglobin today is 9.8. -Ferritin was 227 and percent saturation was 24 on 09/13/2019.  B12 was 584. -He is receiving Procrit every other Thursday with Dr. Theador Hawthorne.  3.  Bone strengthening agents: -Because of CKD and old age, we are not treating with bisphosphonates.  4.  Diabetes: -As his A1c was normal, insulin was reportedly discontinued 2 weeks ago.

## 2019-09-18 DIAGNOSIS — I130 Hypertensive heart and chronic kidney disease with heart failure and stage 1 through stage 4 chronic kidney disease, or unspecified chronic kidney disease: Secondary | ICD-10-CM | POA: Diagnosis not present

## 2019-09-18 DIAGNOSIS — M4316 Spondylolisthesis, lumbar region: Secondary | ICD-10-CM | POA: Diagnosis not present

## 2019-09-18 DIAGNOSIS — M5417 Radiculopathy, lumbosacral region: Secondary | ICD-10-CM | POA: Diagnosis not present

## 2019-09-18 DIAGNOSIS — L89152 Pressure ulcer of sacral region, stage 2: Secondary | ICD-10-CM | POA: Diagnosis not present

## 2019-09-18 DIAGNOSIS — I5033 Acute on chronic diastolic (congestive) heart failure: Secondary | ICD-10-CM | POA: Diagnosis not present

## 2019-09-18 DIAGNOSIS — E1122 Type 2 diabetes mellitus with diabetic chronic kidney disease: Secondary | ICD-10-CM | POA: Diagnosis not present

## 2019-09-20 DIAGNOSIS — Z23 Encounter for immunization: Secondary | ICD-10-CM | POA: Diagnosis not present

## 2019-09-21 DIAGNOSIS — I130 Hypertensive heart and chronic kidney disease with heart failure and stage 1 through stage 4 chronic kidney disease, or unspecified chronic kidney disease: Secondary | ICD-10-CM | POA: Diagnosis not present

## 2019-09-21 DIAGNOSIS — M5417 Radiculopathy, lumbosacral region: Secondary | ICD-10-CM | POA: Diagnosis not present

## 2019-09-21 DIAGNOSIS — L89152 Pressure ulcer of sacral region, stage 2: Secondary | ICD-10-CM | POA: Diagnosis not present

## 2019-09-21 DIAGNOSIS — M4316 Spondylolisthesis, lumbar region: Secondary | ICD-10-CM | POA: Diagnosis not present

## 2019-09-21 DIAGNOSIS — I5033 Acute on chronic diastolic (congestive) heart failure: Secondary | ICD-10-CM | POA: Diagnosis not present

## 2019-09-21 DIAGNOSIS — E1122 Type 2 diabetes mellitus with diabetic chronic kidney disease: Secondary | ICD-10-CM | POA: Diagnosis not present

## 2019-09-28 DIAGNOSIS — L89152 Pressure ulcer of sacral region, stage 2: Secondary | ICD-10-CM | POA: Diagnosis not present

## 2019-09-28 DIAGNOSIS — D509 Iron deficiency anemia, unspecified: Secondary | ICD-10-CM | POA: Diagnosis not present

## 2019-09-28 DIAGNOSIS — E1122 Type 2 diabetes mellitus with diabetic chronic kidney disease: Secondary | ICD-10-CM | POA: Diagnosis not present

## 2019-09-28 DIAGNOSIS — E559 Vitamin D deficiency, unspecified: Secondary | ICD-10-CM | POA: Diagnosis not present

## 2019-09-28 DIAGNOSIS — M4316 Spondylolisthesis, lumbar region: Secondary | ICD-10-CM | POA: Diagnosis not present

## 2019-09-28 DIAGNOSIS — N185 Chronic kidney disease, stage 5: Secondary | ICD-10-CM | POA: Diagnosis not present

## 2019-09-28 DIAGNOSIS — R809 Proteinuria, unspecified: Secondary | ICD-10-CM | POA: Diagnosis not present

## 2019-09-28 DIAGNOSIS — I130 Hypertensive heart and chronic kidney disease with heart failure and stage 1 through stage 4 chronic kidney disease, or unspecified chronic kidney disease: Secondary | ICD-10-CM | POA: Diagnosis not present

## 2019-09-28 DIAGNOSIS — D631 Anemia in chronic kidney disease: Secondary | ICD-10-CM | POA: Diagnosis not present

## 2019-09-28 DIAGNOSIS — M5417 Radiculopathy, lumbosacral region: Secondary | ICD-10-CM | POA: Diagnosis not present

## 2019-09-28 DIAGNOSIS — Z79899 Other long term (current) drug therapy: Secondary | ICD-10-CM | POA: Diagnosis not present

## 2019-09-28 DIAGNOSIS — I5033 Acute on chronic diastolic (congestive) heart failure: Secondary | ICD-10-CM | POA: Diagnosis not present

## 2019-10-02 DIAGNOSIS — M4316 Spondylolisthesis, lumbar region: Secondary | ICD-10-CM | POA: Diagnosis not present

## 2019-10-02 DIAGNOSIS — L89152 Pressure ulcer of sacral region, stage 2: Secondary | ICD-10-CM | POA: Diagnosis not present

## 2019-10-02 DIAGNOSIS — M5417 Radiculopathy, lumbosacral region: Secondary | ICD-10-CM | POA: Diagnosis not present

## 2019-10-02 DIAGNOSIS — E1122 Type 2 diabetes mellitus with diabetic chronic kidney disease: Secondary | ICD-10-CM | POA: Diagnosis not present

## 2019-10-02 DIAGNOSIS — I130 Hypertensive heart and chronic kidney disease with heart failure and stage 1 through stage 4 chronic kidney disease, or unspecified chronic kidney disease: Secondary | ICD-10-CM | POA: Diagnosis not present

## 2019-10-02 DIAGNOSIS — I5033 Acute on chronic diastolic (congestive) heart failure: Secondary | ICD-10-CM | POA: Diagnosis not present

## 2019-10-03 DIAGNOSIS — Z7984 Long term (current) use of oral hypoglycemic drugs: Secondary | ICD-10-CM | POA: Diagnosis not present

## 2019-10-03 DIAGNOSIS — Z87440 Personal history of urinary (tract) infections: Secondary | ICD-10-CM | POA: Diagnosis not present

## 2019-10-03 DIAGNOSIS — D631 Anemia in chronic kidney disease: Secondary | ICD-10-CM | POA: Diagnosis not present

## 2019-10-03 DIAGNOSIS — M4316 Spondylolisthesis, lumbar region: Secondary | ICD-10-CM | POA: Diagnosis not present

## 2019-10-03 DIAGNOSIS — L89152 Pressure ulcer of sacral region, stage 2: Secondary | ICD-10-CM | POA: Diagnosis not present

## 2019-10-03 DIAGNOSIS — I130 Hypertensive heart and chronic kidney disease with heart failure and stage 1 through stage 4 chronic kidney disease, or unspecified chronic kidney disease: Secondary | ICD-10-CM | POA: Diagnosis not present

## 2019-10-03 DIAGNOSIS — N184 Chronic kidney disease, stage 4 (severe): Secondary | ICD-10-CM | POA: Diagnosis not present

## 2019-10-03 DIAGNOSIS — Z9181 History of falling: Secondary | ICD-10-CM | POA: Diagnosis not present

## 2019-10-03 DIAGNOSIS — Z8619 Personal history of other infectious and parasitic diseases: Secondary | ICD-10-CM | POA: Diagnosis not present

## 2019-10-03 DIAGNOSIS — M8580 Other specified disorders of bone density and structure, unspecified site: Secondary | ICD-10-CM | POA: Diagnosis not present

## 2019-10-03 DIAGNOSIS — M5417 Radiculopathy, lumbosacral region: Secondary | ICD-10-CM | POA: Diagnosis not present

## 2019-10-03 DIAGNOSIS — I5033 Acute on chronic diastolic (congestive) heart failure: Secondary | ICD-10-CM | POA: Diagnosis not present

## 2019-10-03 DIAGNOSIS — Z8546 Personal history of malignant neoplasm of prostate: Secondary | ICD-10-CM | POA: Diagnosis not present

## 2019-10-03 DIAGNOSIS — M1030 Gout due to renal impairment, unspecified site: Secondary | ICD-10-CM | POA: Diagnosis not present

## 2019-10-03 DIAGNOSIS — M5407 Panniculitis affecting regions of neck and back, lumbosacral region: Secondary | ICD-10-CM | POA: Diagnosis not present

## 2019-10-03 DIAGNOSIS — E1122 Type 2 diabetes mellitus with diabetic chronic kidney disease: Secondary | ICD-10-CM | POA: Diagnosis not present

## 2019-10-05 DIAGNOSIS — L89152 Pressure ulcer of sacral region, stage 2: Secondary | ICD-10-CM | POA: Diagnosis not present

## 2019-10-05 DIAGNOSIS — M5417 Radiculopathy, lumbosacral region: Secondary | ICD-10-CM | POA: Diagnosis not present

## 2019-10-05 DIAGNOSIS — E1122 Type 2 diabetes mellitus with diabetic chronic kidney disease: Secondary | ICD-10-CM | POA: Diagnosis not present

## 2019-10-05 DIAGNOSIS — I130 Hypertensive heart and chronic kidney disease with heart failure and stage 1 through stage 4 chronic kidney disease, or unspecified chronic kidney disease: Secondary | ICD-10-CM | POA: Diagnosis not present

## 2019-10-05 DIAGNOSIS — M4316 Spondylolisthesis, lumbar region: Secondary | ICD-10-CM | POA: Diagnosis not present

## 2019-10-05 DIAGNOSIS — I5033 Acute on chronic diastolic (congestive) heart failure: Secondary | ICD-10-CM | POA: Diagnosis not present

## 2019-10-10 DIAGNOSIS — E1140 Type 2 diabetes mellitus with diabetic neuropathy, unspecified: Secondary | ICD-10-CM | POA: Diagnosis not present

## 2019-10-10 DIAGNOSIS — E1151 Type 2 diabetes mellitus with diabetic peripheral angiopathy without gangrene: Secondary | ICD-10-CM | POA: Diagnosis not present

## 2019-10-11 DIAGNOSIS — N185 Chronic kidney disease, stage 5: Secondary | ICD-10-CM | POA: Diagnosis not present

## 2019-10-11 DIAGNOSIS — Z79899 Other long term (current) drug therapy: Secondary | ICD-10-CM | POA: Diagnosis not present

## 2019-10-11 DIAGNOSIS — E559 Vitamin D deficiency, unspecified: Secondary | ICD-10-CM | POA: Diagnosis not present

## 2019-10-11 DIAGNOSIS — D509 Iron deficiency anemia, unspecified: Secondary | ICD-10-CM | POA: Diagnosis not present

## 2019-10-11 DIAGNOSIS — D631 Anemia in chronic kidney disease: Secondary | ICD-10-CM | POA: Diagnosis not present

## 2019-10-11 DIAGNOSIS — R809 Proteinuria, unspecified: Secondary | ICD-10-CM | POA: Diagnosis not present

## 2019-10-12 DIAGNOSIS — M5417 Radiculopathy, lumbosacral region: Secondary | ICD-10-CM | POA: Diagnosis not present

## 2019-10-12 DIAGNOSIS — I130 Hypertensive heart and chronic kidney disease with heart failure and stage 1 through stage 4 chronic kidney disease, or unspecified chronic kidney disease: Secondary | ICD-10-CM | POA: Diagnosis not present

## 2019-10-12 DIAGNOSIS — M4316 Spondylolisthesis, lumbar region: Secondary | ICD-10-CM | POA: Diagnosis not present

## 2019-10-12 DIAGNOSIS — E1122 Type 2 diabetes mellitus with diabetic chronic kidney disease: Secondary | ICD-10-CM | POA: Diagnosis not present

## 2019-10-12 DIAGNOSIS — I5033 Acute on chronic diastolic (congestive) heart failure: Secondary | ICD-10-CM | POA: Diagnosis not present

## 2019-10-12 DIAGNOSIS — L89152 Pressure ulcer of sacral region, stage 2: Secondary | ICD-10-CM | POA: Diagnosis not present

## 2019-10-17 DIAGNOSIS — E1129 Type 2 diabetes mellitus with other diabetic kidney complication: Secondary | ICD-10-CM | POA: Diagnosis not present

## 2019-10-17 DIAGNOSIS — E211 Secondary hyperparathyroidism, not elsewhere classified: Secondary | ICD-10-CM | POA: Diagnosis not present

## 2019-10-17 DIAGNOSIS — E871 Hypo-osmolality and hyponatremia: Secondary | ICD-10-CM | POA: Diagnosis not present

## 2019-10-17 DIAGNOSIS — R809 Proteinuria, unspecified: Secondary | ICD-10-CM | POA: Diagnosis not present

## 2019-10-17 DIAGNOSIS — I129 Hypertensive chronic kidney disease with stage 1 through stage 4 chronic kidney disease, or unspecified chronic kidney disease: Secondary | ICD-10-CM | POA: Diagnosis not present

## 2019-10-17 DIAGNOSIS — E1122 Type 2 diabetes mellitus with diabetic chronic kidney disease: Secondary | ICD-10-CM | POA: Diagnosis not present

## 2019-10-17 DIAGNOSIS — N185 Chronic kidney disease, stage 5: Secondary | ICD-10-CM | POA: Diagnosis not present

## 2019-10-19 DIAGNOSIS — E1122 Type 2 diabetes mellitus with diabetic chronic kidney disease: Secondary | ICD-10-CM | POA: Diagnosis not present

## 2019-10-19 DIAGNOSIS — L89152 Pressure ulcer of sacral region, stage 2: Secondary | ICD-10-CM | POA: Diagnosis not present

## 2019-10-19 DIAGNOSIS — M4316 Spondylolisthesis, lumbar region: Secondary | ICD-10-CM | POA: Diagnosis not present

## 2019-10-19 DIAGNOSIS — M5417 Radiculopathy, lumbosacral region: Secondary | ICD-10-CM | POA: Diagnosis not present

## 2019-10-19 DIAGNOSIS — I5033 Acute on chronic diastolic (congestive) heart failure: Secondary | ICD-10-CM | POA: Diagnosis not present

## 2019-10-19 DIAGNOSIS — I130 Hypertensive heart and chronic kidney disease with heart failure and stage 1 through stage 4 chronic kidney disease, or unspecified chronic kidney disease: Secondary | ICD-10-CM | POA: Diagnosis not present

## 2019-10-25 DIAGNOSIS — Z8673 Personal history of transient ischemic attack (TIA), and cerebral infarction without residual deficits: Secondary | ICD-10-CM | POA: Diagnosis not present

## 2019-10-25 DIAGNOSIS — Z833 Family history of diabetes mellitus: Secondary | ICD-10-CM | POA: Diagnosis not present

## 2019-10-25 DIAGNOSIS — N185 Chronic kidney disease, stage 5: Secondary | ICD-10-CM | POA: Diagnosis not present

## 2019-10-25 DIAGNOSIS — D509 Iron deficiency anemia, unspecified: Secondary | ICD-10-CM | POA: Diagnosis not present

## 2019-10-25 DIAGNOSIS — E039 Hypothyroidism, unspecified: Secondary | ICD-10-CM | POA: Diagnosis not present

## 2019-10-25 DIAGNOSIS — D631 Anemia in chronic kidney disease: Secondary | ICD-10-CM | POA: Diagnosis not present

## 2019-10-25 DIAGNOSIS — Z8546 Personal history of malignant neoplasm of prostate: Secondary | ICD-10-CM | POA: Diagnosis not present

## 2019-10-25 DIAGNOSIS — E559 Vitamin D deficiency, unspecified: Secondary | ICD-10-CM | POA: Diagnosis not present

## 2019-10-25 DIAGNOSIS — R809 Proteinuria, unspecified: Secondary | ICD-10-CM | POA: Diagnosis not present

## 2019-10-25 DIAGNOSIS — Z79899 Other long term (current) drug therapy: Secondary | ICD-10-CM | POA: Diagnosis not present

## 2019-10-25 DIAGNOSIS — E119 Type 2 diabetes mellitus without complications: Secondary | ICD-10-CM | POA: Diagnosis not present

## 2019-10-25 DIAGNOSIS — M25551 Pain in right hip: Secondary | ICD-10-CM | POA: Diagnosis not present

## 2019-10-25 DIAGNOSIS — R1031 Right lower quadrant pain: Secondary | ICD-10-CM | POA: Diagnosis not present

## 2019-10-26 DIAGNOSIS — E1122 Type 2 diabetes mellitus with diabetic chronic kidney disease: Secondary | ICD-10-CM | POA: Diagnosis not present

## 2019-10-26 DIAGNOSIS — I130 Hypertensive heart and chronic kidney disease with heart failure and stage 1 through stage 4 chronic kidney disease, or unspecified chronic kidney disease: Secondary | ICD-10-CM | POA: Diagnosis not present

## 2019-10-26 DIAGNOSIS — M5417 Radiculopathy, lumbosacral region: Secondary | ICD-10-CM | POA: Diagnosis not present

## 2019-10-26 DIAGNOSIS — I5033 Acute on chronic diastolic (congestive) heart failure: Secondary | ICD-10-CM | POA: Diagnosis not present

## 2019-10-26 DIAGNOSIS — M4316 Spondylolisthesis, lumbar region: Secondary | ICD-10-CM | POA: Diagnosis not present

## 2019-10-26 DIAGNOSIS — L89152 Pressure ulcer of sacral region, stage 2: Secondary | ICD-10-CM | POA: Diagnosis not present

## 2019-10-29 DIAGNOSIS — E1122 Type 2 diabetes mellitus with diabetic chronic kidney disease: Secondary | ICD-10-CM | POA: Diagnosis not present

## 2019-10-29 DIAGNOSIS — M25551 Pain in right hip: Secondary | ICD-10-CM | POA: Diagnosis not present

## 2019-10-29 DIAGNOSIS — Z299 Encounter for prophylactic measures, unspecified: Secondary | ICD-10-CM | POA: Diagnosis not present

## 2019-10-29 DIAGNOSIS — I723 Aneurysm of iliac artery: Secondary | ICD-10-CM | POA: Diagnosis not present

## 2019-10-29 DIAGNOSIS — N184 Chronic kidney disease, stage 4 (severe): Secondary | ICD-10-CM | POA: Diagnosis not present

## 2019-11-01 DIAGNOSIS — E1122 Type 2 diabetes mellitus with diabetic chronic kidney disease: Secondary | ICD-10-CM | POA: Diagnosis not present

## 2019-11-01 DIAGNOSIS — M5417 Radiculopathy, lumbosacral region: Secondary | ICD-10-CM | POA: Diagnosis not present

## 2019-11-01 DIAGNOSIS — L89152 Pressure ulcer of sacral region, stage 2: Secondary | ICD-10-CM | POA: Diagnosis not present

## 2019-11-01 DIAGNOSIS — M4316 Spondylolisthesis, lumbar region: Secondary | ICD-10-CM | POA: Diagnosis not present

## 2019-11-01 DIAGNOSIS — I130 Hypertensive heart and chronic kidney disease with heart failure and stage 1 through stage 4 chronic kidney disease, or unspecified chronic kidney disease: Secondary | ICD-10-CM | POA: Diagnosis not present

## 2019-11-01 DIAGNOSIS — I5033 Acute on chronic diastolic (congestive) heart failure: Secondary | ICD-10-CM | POA: Diagnosis not present

## 2019-11-08 DIAGNOSIS — D631 Anemia in chronic kidney disease: Secondary | ICD-10-CM | POA: Diagnosis not present

## 2019-11-08 DIAGNOSIS — Z79899 Other long term (current) drug therapy: Secondary | ICD-10-CM | POA: Diagnosis not present

## 2019-11-08 DIAGNOSIS — E559 Vitamin D deficiency, unspecified: Secondary | ICD-10-CM | POA: Diagnosis not present

## 2019-11-08 DIAGNOSIS — R809 Proteinuria, unspecified: Secondary | ICD-10-CM | POA: Diagnosis not present

## 2019-11-08 DIAGNOSIS — N185 Chronic kidney disease, stage 5: Secondary | ICD-10-CM | POA: Diagnosis not present

## 2019-11-08 DIAGNOSIS — D509 Iron deficiency anemia, unspecified: Secondary | ICD-10-CM | POA: Diagnosis not present

## 2019-11-21 DIAGNOSIS — E1151 Type 2 diabetes mellitus with diabetic peripheral angiopathy without gangrene: Secondary | ICD-10-CM | POA: Diagnosis not present

## 2019-11-21 DIAGNOSIS — L89893 Pressure ulcer of other site, stage 3: Secondary | ICD-10-CM | POA: Diagnosis not present

## 2019-11-21 DIAGNOSIS — E1140 Type 2 diabetes mellitus with diabetic neuropathy, unspecified: Secondary | ICD-10-CM | POA: Diagnosis not present

## 2019-11-22 DIAGNOSIS — D631 Anemia in chronic kidney disease: Secondary | ICD-10-CM | POA: Diagnosis not present

## 2019-11-22 DIAGNOSIS — Z79899 Other long term (current) drug therapy: Secondary | ICD-10-CM | POA: Diagnosis not present

## 2019-11-22 DIAGNOSIS — R809 Proteinuria, unspecified: Secondary | ICD-10-CM | POA: Diagnosis not present

## 2019-11-22 DIAGNOSIS — E559 Vitamin D deficiency, unspecified: Secondary | ICD-10-CM | POA: Diagnosis not present

## 2019-11-22 DIAGNOSIS — N185 Chronic kidney disease, stage 5: Secondary | ICD-10-CM | POA: Diagnosis not present

## 2019-12-03 DIAGNOSIS — R109 Unspecified abdominal pain: Secondary | ICD-10-CM | POA: Diagnosis not present

## 2019-12-03 DIAGNOSIS — R531 Weakness: Secondary | ICD-10-CM | POA: Diagnosis not present

## 2019-12-03 DIAGNOSIS — I714 Abdominal aortic aneurysm, without rupture: Secondary | ICD-10-CM | POA: Diagnosis not present

## 2019-12-03 DIAGNOSIS — D649 Anemia, unspecified: Secondary | ICD-10-CM | POA: Diagnosis not present

## 2019-12-03 DIAGNOSIS — E1122 Type 2 diabetes mellitus with diabetic chronic kidney disease: Secondary | ICD-10-CM | POA: Diagnosis not present

## 2019-12-03 DIAGNOSIS — N390 Urinary tract infection, site not specified: Secondary | ICD-10-CM | POA: Diagnosis not present

## 2019-12-03 DIAGNOSIS — M5489 Other dorsalgia: Secondary | ICD-10-CM | POA: Diagnosis not present

## 2019-12-03 DIAGNOSIS — R52 Pain, unspecified: Secondary | ICD-10-CM | POA: Diagnosis not present

## 2019-12-03 DIAGNOSIS — Z8673 Personal history of transient ischemic attack (TIA), and cerebral infarction without residual deficits: Secondary | ICD-10-CM | POA: Diagnosis not present

## 2019-12-11 DIAGNOSIS — N390 Urinary tract infection, site not specified: Secondary | ICD-10-CM | POA: Diagnosis not present

## 2019-12-11 DIAGNOSIS — E1165 Type 2 diabetes mellitus with hyperglycemia: Secondary | ICD-10-CM | POA: Diagnosis not present

## 2019-12-11 DIAGNOSIS — Z6829 Body mass index (BMI) 29.0-29.9, adult: Secondary | ICD-10-CM | POA: Diagnosis not present

## 2019-12-11 DIAGNOSIS — Z299 Encounter for prophylactic measures, unspecified: Secondary | ICD-10-CM | POA: Diagnosis not present

## 2019-12-11 DIAGNOSIS — E1122 Type 2 diabetes mellitus with diabetic chronic kidney disease: Secondary | ICD-10-CM | POA: Diagnosis not present

## 2019-12-11 DIAGNOSIS — N184 Chronic kidney disease, stage 4 (severe): Secondary | ICD-10-CM | POA: Diagnosis not present

## 2019-12-13 ENCOUNTER — Inpatient Hospital Stay (HOSPITAL_COMMUNITY): Payer: No Typology Code available for payment source

## 2019-12-13 ENCOUNTER — Inpatient Hospital Stay (HOSPITAL_BASED_OUTPATIENT_CLINIC_OR_DEPARTMENT_OTHER): Payer: No Typology Code available for payment source | Admitting: Hematology

## 2019-12-13 ENCOUNTER — Inpatient Hospital Stay (HOSPITAL_COMMUNITY): Payer: No Typology Code available for payment source | Attending: Hematology

## 2019-12-13 ENCOUNTER — Other Ambulatory Visit: Payer: Self-pay

## 2019-12-13 VITALS — BP 109/60 | HR 87 | Temp 96.6°F | Resp 17

## 2019-12-13 DIAGNOSIS — D469 Myelodysplastic syndrome, unspecified: Secondary | ICD-10-CM | POA: Insufficient documentation

## 2019-12-13 DIAGNOSIS — R5383 Other fatigue: Secondary | ICD-10-CM | POA: Diagnosis not present

## 2019-12-13 DIAGNOSIS — C7951 Secondary malignant neoplasm of bone: Secondary | ICD-10-CM | POA: Insufficient documentation

## 2019-12-13 DIAGNOSIS — C61 Malignant neoplasm of prostate: Secondary | ICD-10-CM | POA: Insufficient documentation

## 2019-12-13 DIAGNOSIS — R0602 Shortness of breath: Secondary | ICD-10-CM | POA: Insufficient documentation

## 2019-12-13 DIAGNOSIS — G478 Other sleep disorders: Secondary | ICD-10-CM | POA: Insufficient documentation

## 2019-12-13 DIAGNOSIS — Z8673 Personal history of transient ischemic attack (TIA), and cerebral infarction without residual deficits: Secondary | ICD-10-CM | POA: Insufficient documentation

## 2019-12-13 DIAGNOSIS — D631 Anemia in chronic kidney disease: Secondary | ICD-10-CM | POA: Diagnosis not present

## 2019-12-13 DIAGNOSIS — Z800 Family history of malignant neoplasm of digestive organs: Secondary | ICD-10-CM | POA: Insufficient documentation

## 2019-12-13 DIAGNOSIS — N185 Chronic kidney disease, stage 5: Secondary | ICD-10-CM | POA: Diagnosis not present

## 2019-12-13 DIAGNOSIS — N189 Chronic kidney disease, unspecified: Secondary | ICD-10-CM | POA: Diagnosis not present

## 2019-12-13 DIAGNOSIS — D649 Anemia, unspecified: Secondary | ICD-10-CM | POA: Insufficient documentation

## 2019-12-13 DIAGNOSIS — M549 Dorsalgia, unspecified: Secondary | ICD-10-CM | POA: Diagnosis not present

## 2019-12-13 DIAGNOSIS — Z83438 Family history of other disorder of lipoprotein metabolism and other lipidemia: Secondary | ICD-10-CM | POA: Insufficient documentation

## 2019-12-13 DIAGNOSIS — G479 Sleep disorder, unspecified: Secondary | ICD-10-CM | POA: Insufficient documentation

## 2019-12-13 DIAGNOSIS — Z808 Family history of malignant neoplasm of other organs or systems: Secondary | ICD-10-CM | POA: Insufficient documentation

## 2019-12-13 DIAGNOSIS — Z833 Family history of diabetes mellitus: Secondary | ICD-10-CM | POA: Insufficient documentation

## 2019-12-13 DIAGNOSIS — R110 Nausea: Secondary | ICD-10-CM | POA: Insufficient documentation

## 2019-12-13 DIAGNOSIS — Z87440 Personal history of urinary (tract) infections: Secondary | ICD-10-CM | POA: Insufficient documentation

## 2019-12-13 DIAGNOSIS — N183 Chronic kidney disease, stage 3 unspecified: Secondary | ICD-10-CM | POA: Insufficient documentation

## 2019-12-13 DIAGNOSIS — R200 Anesthesia of skin: Secondary | ICD-10-CM | POA: Insufficient documentation

## 2019-12-13 DIAGNOSIS — R630 Anorexia: Secondary | ICD-10-CM | POA: Insufficient documentation

## 2019-12-13 DIAGNOSIS — R1310 Dysphagia, unspecified: Secondary | ICD-10-CM | POA: Insufficient documentation

## 2019-12-13 DIAGNOSIS — Z79899 Other long term (current) drug therapy: Secondary | ICD-10-CM | POA: Insufficient documentation

## 2019-12-13 DIAGNOSIS — K5900 Constipation, unspecified: Secondary | ICD-10-CM | POA: Insufficient documentation

## 2019-12-13 DIAGNOSIS — Z809 Family history of malignant neoplasm, unspecified: Secondary | ICD-10-CM | POA: Insufficient documentation

## 2019-12-13 LAB — COMPREHENSIVE METABOLIC PANEL
ALT: 11 U/L (ref 0–44)
AST: 23 U/L (ref 15–41)
Albumin: 4 g/dL (ref 3.5–5.0)
Alkaline Phosphatase: 56 U/L (ref 38–126)
Anion gap: 16 — ABNORMAL HIGH (ref 5–15)
BUN: 76 mg/dL — ABNORMAL HIGH (ref 8–23)
CO2: 26 mmol/L (ref 22–32)
Calcium: 9.1 mg/dL (ref 8.9–10.3)
Chloride: 96 mmol/L — ABNORMAL LOW (ref 98–111)
Creatinine, Ser: 5.53 mg/dL — ABNORMAL HIGH (ref 0.61–1.24)
GFR calc Af Amer: 9 mL/min — ABNORMAL LOW (ref >60)
GFR calc non Af Amer: 8 mL/min — ABNORMAL LOW (ref >60)
Glucose, Bld: 213 mg/dL — ABNORMAL HIGH (ref 70–99)
Potassium: 4.1 mmol/L (ref 3.5–5.1)
Sodium: 138 mmol/L (ref 135–145)
Total Bilirubin: 0.7 mg/dL (ref 0.3–1.2)
Total Protein: 8 g/dL (ref 6.5–8.1)

## 2019-12-13 LAB — CBC WITH DIFFERENTIAL/PLATELET
Abs Immature Granulocytes: 0.06 10*3/uL (ref 0.00–0.07)
Basophils Absolute: 0 10*3/uL (ref 0.0–0.1)
Basophils Relative: 0 %
Eosinophils Absolute: 0.1 10*3/uL (ref 0.0–0.5)
Eosinophils Relative: 2 %
HCT: 30.9 % — ABNORMAL LOW (ref 39.0–52.0)
Hemoglobin: 9.3 g/dL — ABNORMAL LOW (ref 13.0–17.0)
Immature Granulocytes: 1 %
Lymphocytes Relative: 14 %
Lymphs Abs: 1.1 10*3/uL (ref 0.7–4.0)
MCH: 30.6 pg (ref 26.0–34.0)
MCHC: 30.1 g/dL (ref 30.0–36.0)
MCV: 101.6 fL — ABNORMAL HIGH (ref 80.0–100.0)
Monocytes Absolute: 0.7 10*3/uL (ref 0.1–1.0)
Monocytes Relative: 9 %
Neutro Abs: 5.8 10*3/uL (ref 1.7–7.7)
Neutrophils Relative %: 74 %
Platelets: 184 10*3/uL (ref 150–400)
RBC: 3.04 MIL/uL — ABNORMAL LOW (ref 4.22–5.81)
RDW: 15.3 % (ref 11.5–15.5)
WBC: 7.8 10*3/uL (ref 4.0–10.5)
nRBC: 0 % (ref 0.0–0.2)

## 2019-12-13 LAB — PSA: Prostatic Specific Antigen: 6.88 ng/mL — ABNORMAL HIGH (ref 0.00–4.00)

## 2019-12-13 MED ORDER — ONDANSETRON HCL 4 MG/2ML IJ SOLN
4.0000 mg | Freq: Once | INTRAMUSCULAR | Status: AC
  Administered 2019-12-13: 4 mg via INTRAVENOUS
  Filled 2019-12-13: qty 2

## 2019-12-13 MED ORDER — SODIUM CHLORIDE 0.9 % IV SOLN: INTRAVENOUS | Status: DC

## 2019-12-13 NOTE — Progress Notes (Signed)
Hydration fluids and nausea medication given per orders. Patient tolerated it well without problems. Vitals stable and discharged home from clinic via wheelchair. Follow up as scheduled.

## 2019-12-13 NOTE — Patient Instructions (Addendum)
[place] at [facility] Discharge Instructions  You were seen today by Dr. Delton Coombes. He went over your recent results. Please drink 4-5 bottles of Glucerna every day. Please finish your antibiotics. Dr. Delton Coombes will see you back in 6 weeks for labs and follow up.   Thank you for choosing [place] at [facility] to provide your oncology and hematology care.  To afford each patient quality time with our provider, please arrive at least 15 minutes before your scheduled appointment time.   If you have a lab appointment with the [facility] please come in thru the  Main Entrance and check in at the main information desk  You need to re-schedule your appointment should you arrive 10 or more minutes late.  We strive to give you quality time with our providers, and arriving late affects you and other patients whose appointments are after yours.  Also, if you no show three or more times for appointments you may be dismissed from the clinic at the providers discretion.     Again, thank you for choosing [facility].  Our hope is that these requests will decrease the amount of time that you wait before being seen by our physicians.       _____________________________________________________________  Should you have questions after your visit to [facility], please contact our office at [phone] between the hours of 8:00 a.m. and 4:30 p.m.  Voicemails left after 4:00 p.m. will not be returned until the following business day.  For prescription refill requests, have your pharmacy contact our office and allow 72 hours.    Cancer Center Support Programs:   > Cancer Support Group  2nd Tuesday of the month 1pm-2pm, Journey Room

## 2019-12-13 NOTE — Progress Notes (Signed)
[place], [place] [address]   CLINIC:  Medical Oncology/Hematology  PCP:  Glenda Chroman, MD [address] / [place] [address] [phone]   REASON FOR VISIT:  Follow-up for metastatic prostate cancer to the bones and macrocytic anemia.  CURRENT THERAPY: Lupron injection every 6 months.  BRIEF ONCOLOGIC HISTORY:  Oncology History   No history exists.    CANCER STAGING: Cancer Staging No matching staging information was found for the patient.  INTERVAL HISTORY:  [name], a 86 y.o. male, returns for routine follow-up of his metastatic prostate cancer to the bones and macrocytic anemia. Cameron Phelps was last seen on 09/13/2019.  He recently had a UTI which exacerbated his back pain. He went to the ED on 05/24 and was prescribed antibiotics for it and he is still taking it. He denies having dysuria. He is currently not tolerating solid foods, and only drinking fluids, Glucerna and applesauce. He continues getting Procrit injections every 2 weeks.   REVIEW OF SYSTEMS:  Review of Systems  Constitutional: Positive for appetite change (moderately decreased) and fatigue (severe).  HENT:   Positive for trouble swallowing (gagging).   Respiratory: Positive for shortness of breath.   Gastrointestinal: Positive for constipation and nausea.  Neurological: Positive for numbness. Negative for light-headedness.  Psychiatric/Behavioral: Positive for sleep disturbance.  All other systems reviewed and are negative.   PAST MEDICAL/SURGICAL HISTORY:  Past Medical History:  Diagnosis Date  . Anemia   . Arthritis    gout  . Cancer [facility])    Prostate  . Chronic back pain   . Chronic kidney disease   . Diabetes mellitus without complication ([place])   . Gout   . Hypothyroidism   . Leg edema   . Metabolic bone disease   . Pancreatic cancer ([place])   . PONV (postoperative nausea and vomiting)   . Proteinuria   . Stroke [facility])    residual left  -sided weakness   Past Surgical History:  Procedure Laterality Date  . AV FISTULA PLACEMENT Left 04/08/2014   Procedure: ARTERIOVENOUS (AV) FISTULA CREATION;  Surgeon: Conrad Manilla, MD;  Location: [place];  Service: Vascular;  Laterality: Left;  . BASCILIC VEIN TRANSPOSITION Left 07/31/2014   Procedure: SECOND STAGE BASILIC VEIN TRANSPOSITION LEFT ARM;  Surgeon: Conrad Pascoag, MD;  Location: [place];  Service: Vascular;  Laterality: Left;  . CATARACT EXTRACTION    . COLONOSCOPY W/ BIOPSIES AND POLYPECTOMY    . ESOPHAGOGASTRODUODENOSCOPY (EGD) WITH PROPOFOL N/A 09/24/2016   Procedure: ESOPHAGOGASTRODUODENOSCOPY (EGD) WITH PROPOFOL;  Surgeon: Otis Brace, MD;  Location: [place];  Service: Gastroenterology;  Laterality: N/A;    SOCIAL HISTORY:  Social History   Socioeconomic History  . Marital status: Widowed    Spouse name: Not on file  . Number of children: 2  . Years of education: Not on file  . Highest education level: Not on file  Occupational History  . Not on file  Tobacco Use  . Smoking status: Never Smoker  . Smokeless tobacco: Never Used  Substance and Sexual Activity  . Alcohol use: No    Alcohol/week: 0.0 standard drinks  . Drug use: No  . Sexual activity: Not on file  Other Topics Concern  . Not on file  Social History Narrative  . Not on file   Social Determinants of Health   Financial Resource Strain: Low Risk   . Difficulty of Paying Living Expenses: Not hard at  all  Food Insecurity: No Food Insecurity  . Worried About Charity fundraiser in the Last Year: Never true  . Ran Out of Food in the Last Year: Never true  Transportation Needs: No Transportation Needs  . Lack of Transportation (Medical): No  . Lack of Transportation (Non-Medical): No  Physical Activity: Insufficiently Active  . Days of Exercise per Week: 1 day  . Minutes of Exercise per Session: 10 min  Stress: No Stress Concern Present  . Feeling of Stress : Not at all  Social Connections:  Somewhat Isolated  . Frequency of Communication with Friends and Family: More than three times a week  . Frequency of Social Gatherings with Friends and Family: More than three times a week  . Attends Religious Services: 1 to 4 times per year  . Active Member of Clubs or Organizations: No  . Attends Archivist Meetings: Never  . Marital Status: Widowed  Intimate Partner Violence: Not At Risk  . Fear of Current or Ex-Partner: No  . Emotionally Abused: No  . Physically Abused: No  . Sexually Abused: No    FAMILY HISTORY:  Family History  Problem Relation Age of Onset  . Diabetes Mother   . Cancer Daughter   . Diabetes Daughter   . Hyperlipidemia Daughter   . Brain cancer Father   . Diabetes Daughter     CURRENT MEDICATIONS:  Current Outpatient Medications  Medication Sig Dispense Refill  . acetaminophen (TYLENOL) 500 MG tablet Take 500 mg by mouth 2 (two) times daily.    Marland Kitchen allopurinol (ZYLOPRIM) 100 MG tablet Take 100 mg by mouth daily.    Marland Kitchen CINNAMON PO Take by mouth 2 (two) times daily.    Marland Kitchen docusate sodium (COLACE) 100 MG capsule Take 100 mg by mouth 2 (two) times daily.    Marland Kitchen epoetin alfa-epbx (RETACRIT) 36644 UNIT/ML injection Inject as directed PT RECEIVING 12,000 UNITS Q 2 WEEKS    . levothyroxine (SYNTHROID, LEVOTHROID) 88 MCG tablet Take 88 mcg by mouth daily before breakfast.    . pantoprazole (PROTONIX) 20 MG tablet Take 1 tablet (20 mg total) by mouth daily. (Patient taking differently: Take 40 mg by mouth daily. ) 30 tablet 2  . pioglitazone (ACTOS) 30 MG tablet Take 45 mg by mouth daily.     Marland Kitchen senna (SENOKOT) 8.6 MG tablet Take 1 tablet by mouth 2 (two) times daily.    . sodium bicarbonate 650 MG tablet Take 650 mg by mouth 3 (three) times daily.    Marland Kitchen torsemide (DEMADEX) 20 MG tablet Take 20 mg by mouth daily.     No current facility-administered medications for this visit.    ALLERGIES:  No Known Allergies  PHYSICAL EXAM:  Performance status (ECOG):  2 - Symptomatic, <50% confined to bed  There were no vitals filed for this visit. Wt Readings from Last 3 Encounters:  09/24/16 205 lb (93 kg)  09/18/14 208 lb (94.3 kg)  09/06/14 202 lb (91.6 kg)   Physical Exam Vitals reviewed.  Constitutional:      Appearance: Normal appearance.  Cardiovascular:     Rate and Rhythm: Normal rate and regular rhythm.     Pulses: Normal pulses.     Heart sounds: Normal heart sounds.  Pulmonary:     Effort: Pulmonary effort is normal.     Breath sounds: Normal breath sounds.  Neurological:     General: No focal deficit present.     Mental Status: He is alert  and oriented to person, place, and time.  Psychiatric:        Mood and Affect: Mood normal.        Behavior: Behavior normal.      LABORATORY DATA:  I have reviewed the labs as listed.  CBC Latest Ref Rng & Units 09/13/2019 05/25/2019 09/24/2016  WBC 4.0 - 10.5 K/uL 5.6 4.6 8.0  Hemoglobin 13.0 - 17.0 g/dL 9.8(L) 10.0(L) 9.3(L)  Hematocrit 39.0 - 52.0 % 32.7(L) 34.0(L) 29.4(L)  Platelets 150 - 400 K/uL 160 137(L) 197   CMP Latest Ref Rng & Units 09/13/2019 05/25/2019 09/24/2016  Glucose 70 - 99 mg/dL 176(H) 169(H) 166(H)  BUN 8 - 23 mg/dL 69(H) 81(H) 83(H)  Creatinine 0.61 - 1.24 mg/dL 5.23(H) 5.57(H) 4.68(H)  Sodium 135 - 145 mmol/L 138 140 140  Potassium 3.5 - 5.1 mmol/L 4.6 4.4 3.7  Chloride 98 - 111 mmol/L 101 103 95(L)  CO2 22 - 32 mmol/L 25 24 29   Calcium 8.9 - 10.3 mg/dL 9.3 9.2 9.8  Total Protein 6.5 - 8.1 g/dL 7.7 7.7 -  Total Bilirubin 0.3 - 1.2 mg/dL 0.6 0.6 -  Alkaline Phos 38 - 126 U/L 51 53 -  AST 15 - 41 U/L 17 20 -  ALT 0 - 44 U/L 8 10 -    DIAGNOSTIC IMAGING:  I have independently reviewed the scans and discussed with the patient.   ASSESSMENT:  1.  Metastatic prostate cancer to the bones: -Presentation with PSA of 861 in July 2016.  Started on Eligard every 6 months at [facility]. -Bicalutamide 50 mg discontinued around 03/26/2019 as his PSA was  rising. -Bone scan on 05/25/2019 did not show any evidence of metastatic disease. -Lupron 45 mg on 09/15/2019.    PLAN:  1.  Metastatic prostate cancer to the bones: -His PSA has come down to 6.88 today.  In March it was 9.06. -We will reevaluate him in 3 months with repeat PSA.  We will give Lupron injection at that time. -We will will consider genetic mutation testing in the future.  2.  CKD: -His creatinine today is 5.53 and stable.  3.  UTI: -Reportedly treated at [facility] on 12/03/2019. -Currently on Bactrim twice daily.  4.  Nausea: -We will give him Zofran 4 mg IV along with 100 mL of normal saline. -He will use Zofran 4 mg as needed at home.  5.  Macrocytic anemia: -Combination anemia from CKD and early MDS. -Hemoglobin today is 9.3 with MCV of 101.6. -Ferritin was 227 and percent saturation was 24 on 09/13/2019.  Folic acid and H60 was normal.    Orders placed this encounter:  No orders of the defined types were placed in this encounter.    Derek Jack, MD [facility] [phone]   I, Jacqualyn Posey, am acting as a scribe for Dr. Sanda Linger.  I, Derek Jack MD, have reviewed the above documentation for accuracy and completeness, and I agree with the above.

## 2019-12-19 DIAGNOSIS — R52 Pain, unspecified: Secondary | ICD-10-CM | POA: Diagnosis not present

## 2019-12-19 DIAGNOSIS — R1084 Generalized abdominal pain: Secondary | ICD-10-CM | POA: Diagnosis not present

## 2019-12-19 DIAGNOSIS — K5904 Chronic idiopathic constipation: Secondary | ICD-10-CM | POA: Diagnosis not present

## 2019-12-21 DIAGNOSIS — E1129 Type 2 diabetes mellitus with other diabetic kidney complication: Secondary | ICD-10-CM | POA: Diagnosis not present

## 2019-12-21 DIAGNOSIS — R809 Proteinuria, unspecified: Secondary | ICD-10-CM | POA: Diagnosis not present

## 2019-12-21 DIAGNOSIS — E871 Hypo-osmolality and hyponatremia: Secondary | ICD-10-CM | POA: Diagnosis not present

## 2019-12-21 DIAGNOSIS — D631 Anemia in chronic kidney disease: Secondary | ICD-10-CM | POA: Diagnosis not present

## 2019-12-21 DIAGNOSIS — E1122 Type 2 diabetes mellitus with diabetic chronic kidney disease: Secondary | ICD-10-CM | POA: Diagnosis not present

## 2019-12-21 DIAGNOSIS — N189 Chronic kidney disease, unspecified: Secondary | ICD-10-CM | POA: Diagnosis not present

## 2019-12-21 DIAGNOSIS — N185 Chronic kidney disease, stage 5: Secondary | ICD-10-CM | POA: Diagnosis not present

## 2019-12-21 DIAGNOSIS — E211 Secondary hyperparathyroidism, not elsewhere classified: Secondary | ICD-10-CM | POA: Diagnosis not present

## 2019-12-21 DIAGNOSIS — I129 Hypertensive chronic kidney disease with stage 1 through stage 4 chronic kidney disease, or unspecified chronic kidney disease: Secondary | ICD-10-CM | POA: Diagnosis not present

## 2020-01-10 DIAGNOSIS — N185 Chronic kidney disease, stage 5: Secondary | ICD-10-CM | POA: Diagnosis not present

## 2020-01-10 DIAGNOSIS — D631 Anemia in chronic kidney disease: Secondary | ICD-10-CM | POA: Diagnosis not present

## 2020-01-23 DIAGNOSIS — I739 Peripheral vascular disease, unspecified: Secondary | ICD-10-CM | POA: Diagnosis not present

## 2020-01-23 DIAGNOSIS — E1151 Type 2 diabetes mellitus with diabetic peripheral angiopathy without gangrene: Secondary | ICD-10-CM | POA: Diagnosis not present

## 2020-01-23 DIAGNOSIS — M79672 Pain in left foot: Secondary | ICD-10-CM | POA: Diagnosis not present

## 2020-01-23 DIAGNOSIS — M79675 Pain in left toe(s): Secondary | ICD-10-CM | POA: Diagnosis not present

## 2020-01-23 DIAGNOSIS — E1140 Type 2 diabetes mellitus with diabetic neuropathy, unspecified: Secondary | ICD-10-CM | POA: Diagnosis not present

## 2020-01-23 DIAGNOSIS — L89893 Pressure ulcer of other site, stage 3: Secondary | ICD-10-CM | POA: Diagnosis not present

## 2020-01-24 DIAGNOSIS — D631 Anemia in chronic kidney disease: Secondary | ICD-10-CM | POA: Diagnosis not present

## 2020-01-24 DIAGNOSIS — N185 Chronic kidney disease, stage 5: Secondary | ICD-10-CM | POA: Diagnosis not present

## 2020-01-31 ENCOUNTER — Other Ambulatory Visit (HOSPITAL_COMMUNITY): Payer: No Typology Code available for payment source

## 2020-01-31 ENCOUNTER — Ambulatory Visit (HOSPITAL_COMMUNITY): Payer: No Typology Code available for payment source | Admitting: Hematology

## 2020-02-02 DIAGNOSIS — R5381 Other malaise: Secondary | ICD-10-CM | POA: Diagnosis not present

## 2020-02-02 DIAGNOSIS — R69 Illness, unspecified: Secondary | ICD-10-CM | POA: Diagnosis not present

## 2020-02-02 DIAGNOSIS — W19XXXA Unspecified fall, initial encounter: Secondary | ICD-10-CM | POA: Diagnosis not present

## 2020-02-07 DIAGNOSIS — N185 Chronic kidney disease, stage 5: Secondary | ICD-10-CM | POA: Diagnosis not present

## 2020-02-22 DIAGNOSIS — D631 Anemia in chronic kidney disease: Secondary | ICD-10-CM | POA: Diagnosis not present

## 2020-02-22 DIAGNOSIS — N185 Chronic kidney disease, stage 5: Secondary | ICD-10-CM | POA: Diagnosis not present

## 2020-02-28 ENCOUNTER — Other Ambulatory Visit (HOSPITAL_COMMUNITY): Payer: Self-pay

## 2020-02-28 ENCOUNTER — Other Ambulatory Visit: Payer: Self-pay

## 2020-02-28 ENCOUNTER — Inpatient Hospital Stay (HOSPITAL_COMMUNITY): Payer: No Typology Code available for payment source

## 2020-02-28 ENCOUNTER — Inpatient Hospital Stay (HOSPITAL_COMMUNITY): Payer: No Typology Code available for payment source | Attending: Hematology | Admitting: Hematology

## 2020-02-28 ENCOUNTER — Ambulatory Visit (HOSPITAL_COMMUNITY)
Admission: RE | Admit: 2020-02-28 | Discharge: 2020-02-28 | Disposition: A | Discharge status: Home / Self Care | Payer: No Typology Code available for payment source | Source: Ambulatory Visit | Attending: Hematology | Admitting: Hematology

## 2020-02-28 VITALS — BP 112/68 | HR 84 | Temp 97.1°F | Resp 18

## 2020-02-28 DIAGNOSIS — M25552 Pain in left hip: Secondary | ICD-10-CM | POA: Insufficient documentation

## 2020-02-28 DIAGNOSIS — C7951 Secondary malignant neoplasm of bone: Secondary | ICD-10-CM | POA: Insufficient documentation

## 2020-02-28 DIAGNOSIS — K5900 Constipation, unspecified: Secondary | ICD-10-CM | POA: Insufficient documentation

## 2020-02-28 DIAGNOSIS — G479 Sleep disorder, unspecified: Secondary | ICD-10-CM | POA: Diagnosis not present

## 2020-02-28 DIAGNOSIS — M2550 Pain in unspecified joint: Secondary | ICD-10-CM | POA: Insufficient documentation

## 2020-02-28 DIAGNOSIS — R5383 Other fatigue: Secondary | ICD-10-CM | POA: Diagnosis not present

## 2020-02-28 DIAGNOSIS — Z833 Family history of diabetes mellitus: Secondary | ICD-10-CM | POA: Insufficient documentation

## 2020-02-28 DIAGNOSIS — Z79899 Other long term (current) drug therapy: Secondary | ICD-10-CM | POA: Insufficient documentation

## 2020-02-28 DIAGNOSIS — Z809 Family history of malignant neoplasm, unspecified: Secondary | ICD-10-CM | POA: Insufficient documentation

## 2020-02-28 DIAGNOSIS — D631 Anemia in chronic kidney disease: Secondary | ICD-10-CM | POA: Diagnosis not present

## 2020-02-28 DIAGNOSIS — Z8673 Personal history of transient ischemic attack (TIA), and cerebral infarction without residual deficits: Secondary | ICD-10-CM | POA: Insufficient documentation

## 2020-02-28 DIAGNOSIS — C61 Malignant neoplasm of prostate: Secondary | ICD-10-CM

## 2020-02-28 DIAGNOSIS — Z808 Family history of malignant neoplasm of other organs or systems: Secondary | ICD-10-CM | POA: Insufficient documentation

## 2020-02-28 DIAGNOSIS — N183 Chronic kidney disease, stage 3 unspecified: Secondary | ICD-10-CM | POA: Insufficient documentation

## 2020-02-28 DIAGNOSIS — Z8349 Family history of other endocrine, nutritional and metabolic diseases: Secondary | ICD-10-CM | POA: Insufficient documentation

## 2020-02-28 DIAGNOSIS — Z8546 Personal history of malignant neoplasm of prostate: Secondary | ICD-10-CM | POA: Diagnosis not present

## 2020-02-28 DIAGNOSIS — D539 Nutritional anemia, unspecified: Secondary | ICD-10-CM | POA: Diagnosis not present

## 2020-02-28 LAB — COMPREHENSIVE METABOLIC PANEL
ALT: 8 U/L (ref 0–44)
AST: 18 U/L (ref 15–41)
Albumin: 3.9 g/dL (ref 3.5–5.0)
Alkaline Phosphatase: 59 U/L (ref 38–126)
Anion gap: 14 (ref 5–15)
BUN: 79 mg/dL — ABNORMAL HIGH (ref 8–23)
CO2: 28 mmol/L (ref 22–32)
Calcium: 9.5 mg/dL (ref 8.9–10.3)
Chloride: 96 mmol/L — ABNORMAL LOW (ref 98–111)
Creatinine, Ser: 3.79 mg/dL — ABNORMAL HIGH (ref 0.61–1.24)
GFR calc Af Amer: 14 mL/min — ABNORMAL LOW (ref >60)
GFR calc non Af Amer: 12 mL/min — ABNORMAL LOW (ref >60)
Glucose, Bld: 225 mg/dL — ABNORMAL HIGH (ref 70–99)
Potassium: 4.1 mmol/L (ref 3.5–5.1)
Sodium: 138 mmol/L (ref 135–145)
Total Bilirubin: 0.5 mg/dL (ref 0.3–1.2)
Total Protein: 7.7 g/dL (ref 6.5–8.1)

## 2020-02-28 LAB — CBC WITH DIFFERENTIAL/PLATELET
Abs Immature Granulocytes: 0.05 10*3/uL (ref 0.00–0.07)
Basophils Absolute: 0.1 10*3/uL (ref 0.0–0.1)
Basophils Relative: 1 %
Eosinophils Absolute: 0.2 10*3/uL (ref 0.0–0.5)
Eosinophils Relative: 3 %
HCT: 33 % — ABNORMAL LOW (ref 39.0–52.0)
Hemoglobin: 9.8 g/dL — ABNORMAL LOW (ref 13.0–17.0)
Immature Granulocytes: 1 %
Lymphocytes Relative: 18 %
Lymphs Abs: 1.2 10*3/uL (ref 0.7–4.0)
MCH: 29.6 pg (ref 26.0–34.0)
MCHC: 29.7 g/dL — ABNORMAL LOW (ref 30.0–36.0)
MCV: 99.7 fL (ref 80.0–100.0)
Monocytes Absolute: 0.6 10*3/uL (ref 0.1–1.0)
Monocytes Relative: 9 %
Neutro Abs: 4.5 10*3/uL (ref 1.7–7.7)
Neutrophils Relative %: 68 %
Platelets: 219 10*3/uL (ref 150–400)
RBC: 3.31 MIL/uL — ABNORMAL LOW (ref 4.22–5.81)
RDW: 15.7 % — ABNORMAL HIGH (ref 11.5–15.5)
WBC: 6.7 10*3/uL (ref 4.0–10.5)
nRBC: 0 % (ref 0.0–0.2)

## 2020-02-28 LAB — PSA: Prostatic Specific Antigen: 5.53 ng/mL — ABNORMAL HIGH (ref 0.00–4.00)

## 2020-02-28 NOTE — Patient Instructions (Signed)
[place] at [facility] Discharge Instructions  You were seen today by Dr. Delton Coombes. He went over your recent results. You will be scheduled for an x-ray of your left hip for the hip pain. Your next injection is due on September 8th. Dr. Delton Coombes will see you back in 15 weeks for labs and follow up.   Thank you for choosing [place] at [facility] to provide your oncology and hematology care.  To afford each patient quality time with our provider, please arrive at least 15 minutes before your scheduled appointment time.   If you have a lab appointment with the [facility] please come in thru the Main Entrance and check in at the main information desk  You need to re-schedule your appointment should you arrive 10 or more minutes late.  We strive to give you quality time with our providers, and arriving late affects you and other patients whose appointments are after yours.  Also, if you no show three or more times for appointments you may be dismissed from the clinic at the providers discretion.     Again, thank you for choosing [facility].  Our hope is that these requests will decrease the amount of time that you wait before being seen by our physicians.       _____________________________________________________________  Should you have questions after your visit to [facility], please contact our office at [phone] between the hours of 8:00 a.m. and 4:30 p.m.  Voicemails left after 4:00 p.m. will not be returned until the following business day.  For prescription refill requests, have your pharmacy contact our office and allow 72 hours.    Cancer Center Support Programs:   > Cancer Support Group  2nd Tuesday of the month 1pm-2pm, Journey Room

## 2020-02-28 NOTE — Progress Notes (Signed)
[place], [place] [address]   CLINIC:  Medical Oncology/Hematology  PCP:  Glenda Chroman, MD [address] / [place] [address] [phone]   REASON FOR VISIT:  Follow-up for metastatic prostate cancer to the bones and macrocytic anemia  PRIOR THERAPY: None  NGS Results: Not done  CURRENT THERAPY: Lupron injection every 6 months  BRIEF ONCOLOGIC HISTORY:  Oncology History   No history exists.    CANCER STAGING: Cancer Staging No matching staging information was found for the patient.  INTERVAL HISTORY:  Cameron Phelps, a 86 y.o. male, returns for routine follow-up of his metastatic prostate cancer to the bones and macrocytic anemia. [place] was last seen on 12/13/2019.   Today he is accompanied by his daughter. He complains of having pain in his left inguinal area which shoots down his left leg; he denies any recent UTI or traumas. The intermittent pain has been ongoing for the past 2 weeks and lasts for about 1 minute.  He ambulates with a walker at home, but most of the time he sits in a chair. He continues getting Procrit with Dr. Theador Hawthorne every other week.   REVIEW OF SYSTEMS:  Review of Systems  Constitutional: Positive for appetite change (moderately decreased) and fatigue (severe).  Gastrointestinal: Positive for constipation.  Musculoskeletal: Positive for arthralgias (9/10 L hip and leg pain).  Psychiatric/Behavioral: Positive for sleep disturbance.  All other systems reviewed and are negative.   PAST MEDICAL/SURGICAL HISTORY:  Past Medical History:  Diagnosis Date  . Anemia   . Arthritis    gout  . Cancer [facility])    Prostate  . Chronic back pain   . Chronic kidney disease   . Diabetes mellitus without complication ([place])   . Gout   . Hypothyroidism   . Leg edema   . Metabolic bone disease   . Pancreatic cancer ([place])   . PONV (postoperative nausea and vomiting)   . Proteinuria   . Stroke [facility])    residual  left -sided weakness   Past Surgical History:  Procedure Laterality Date  . AV FISTULA PLACEMENT Left 04/08/2014   Procedure: ARTERIOVENOUS (AV) FISTULA CREATION;  Surgeon: Conrad Winfield, MD;  Location: [place];  Service: Vascular;  Laterality: Left;  . BASCILIC VEIN TRANSPOSITION Left 07/31/2014   Procedure: SECOND STAGE BASILIC VEIN TRANSPOSITION LEFT ARM;  Surgeon: Conrad Grandview, MD;  Location: [facility];  Service: Vascular;  Laterality: Left;  . CATARACT EXTRACTION    . COLONOSCOPY W/ BIOPSIES AND POLYPECTOMY    . ESOPHAGOGASTRODUODENOSCOPY (EGD) WITH PROPOFOL N/A 09/24/2016   Procedure: ESOPHAGOGASTRODUODENOSCOPY (EGD) WITH PROPOFOL;  Surgeon: Otis Brace, MD;  Location: [place];  Service: Gastroenterology;  Laterality: N/A;    SOCIAL HISTORY:  Social History   Socioeconomic History  . Marital status: Widowed    Spouse name: Not on file  . Number of children: 2  . Years of education: Not on file  . Highest education level: Not on file  Occupational History  . Not on file  Tobacco Use  . Smoking status: Never Smoker  . Smokeless tobacco: Never Used  Substance and Sexual Activity  . Alcohol use: No    Alcohol/week: 0.0 standard drinks  . Drug use: No  . Sexual activity: Not on file  Other Topics Concern  . Not on file  Social History Narrative  . Not on file   Social Determinants of Health   Financial Resource Strain:  Low Risk   . Difficulty of Paying Living Expenses: Not hard at all  Food Insecurity: No Food Insecurity  . Worried About Charity fundraiser in the Last Year: Never true  . Ran Out of Food in the Last Year: Never true  Transportation Needs: No Transportation Needs  . Lack of Transportation (Medical): No  . Lack of Transportation (Non-Medical): No  Physical Activity: Insufficiently Active  . Days of Exercise per Week: 1 day  . Minutes of Exercise per Session: 10 min  Stress: No Stress Concern Present  . Feeling of Stress : Not at all  Social  Connections: Moderately Isolated  . Frequency of Communication with Friends and Family: More than three times a week  . Frequency of Social Gatherings with Friends and Family: More than three times a week  . Attends Religious Services: 1 to 4 times per year  . Active Member of Clubs or Organizations: No  . Attends Archivist Meetings: Never  . Marital Status: Widowed  Intimate Partner Violence: Not At Risk  . Fear of Current or Ex-Partner: No  . Emotionally Abused: No  . Physically Abused: No  . Sexually Abused: No    FAMILY HISTORY:  Family History  Problem Relation Age of Onset  . Diabetes Mother   . Cancer Daughter   . Diabetes Daughter   . Hyperlipidemia Daughter   . Brain cancer Father   . Diabetes Daughter     CURRENT MEDICATIONS:  Current Outpatient Medications  Medication Sig Dispense Refill  . acetaminophen (TYLENOL) 500 MG tablet Take 500 mg by mouth 2 (two) times daily.    Marland Kitchen allopurinol (ZYLOPRIM) 100 MG tablet Take 100 mg by mouth daily.    . cephALEXin (KEFLEX) 500 MG capsule Take 500 mg by mouth 3 (three) times daily.    Marland Kitchen CINNAMON PO Take by mouth 2 (two) times daily.    Marland Kitchen docusate sodium (COLACE) 100 MG capsule Take 100 mg by mouth 2 (two) times daily.    Marland Kitchen epoetin alfa-epbx (RETACRIT) 36144 UNIT/ML injection Inject as directed PT RECEIVING 12,000 UNITS Q 2 WEEKS    . lactulose (CHRONULAC) 10 GM/15ML solution Take 20 g by mouth 2 (two) times daily.    Marland Kitchen levothyroxine (SYNTHROID, LEVOTHROID) 88 MCG tablet Take 88 mcg by mouth daily before breakfast.    . ondansetron (ZOFRAN) 4 MG tablet     . pantoprazole (PROTONIX) 40 MG tablet Take 40 mg by mouth daily.    . pioglitazone (ACTOS) 30 MG tablet Take 45 mg by mouth daily.     Marland Kitchen senna (SENOKOT) 8.6 MG tablet Take 1 tablet by mouth 2 (two) times daily.    . sodium bicarbonate 650 MG tablet Take 650 mg by mouth 3 (three) times daily.    Marland Kitchen torsemide (DEMADEX) 20 MG tablet Take 20 mg by mouth daily.    .  traMADol (ULTRAM) 50 MG tablet      No current facility-administered medications for this visit.    ALLERGIES:  No Known Allergies  PHYSICAL EXAM:  Performance status (ECOG): 2 - Symptomatic, <50% confined to bed  Vitals:   02/28/20 1424  BP: 112/68  Pulse: 84  Resp: 18  Temp: (!) 97.1 F (36.2 C)  SpO2: 94%   Wt Readings from Last 3 Encounters:  09/24/16 205 lb (93 kg)  09/18/14 208 lb (94.3 kg)  09/06/14 202 lb (91.6 kg)   Physical Exam Vitals reviewed.  Constitutional:  Appearance: Normal appearance.  Cardiovascular:     Rate and Rhythm: Normal rate and regular rhythm.     Pulses: Normal pulses.     Heart sounds: Normal heart sounds.  Pulmonary:     Effort: Pulmonary effort is normal.     Breath sounds: Normal breath sounds.  Neurological:     General: No focal deficit present.     Mental Status: He is alert and oriented to person, place, and time.  Psychiatric:        Mood and Affect: Mood normal.        Behavior: Behavior normal.      LABORATORY DATA:  I have reviewed the labs as listed.  CBC Latest Ref Rng & Units 02/28/2020 12/13/2019 09/13/2019  WBC 4.0 - 10.5 K/uL 6.7 7.8 5.6  Hemoglobin 13.0 - 17.0 g/dL 9.8(L) 9.3(L) 9.8(L)  Hematocrit 39 - 52 % 33.0(L) 30.9(L) 32.7(L)  Platelets 150 - 400 K/uL 219 184 160   CMP Latest Ref Rng & Units 02/28/2020 12/13/2019 09/13/2019  Glucose 70 - 99 mg/dL 225(H) 213(H) 176(H)  BUN 8 - 23 mg/dL 79(H) 76(H) 69(H)  Creatinine 0.61 - 1.24 mg/dL 3.79(H) 5.53(H) 5.23(H)  Sodium 135 - 145 mmol/L 138 138 138  Potassium 3.5 - 5.1 mmol/L 4.1 4.1 4.6  Chloride 98 - 111 mmol/L 96(L) 96(L) 101  CO2 22 - 32 mmol/L 28 26 25   Calcium 8.9 - 10.3 mg/dL 9.5 9.1 9.3  Total Protein 6.5 - 8.1 g/dL 7.7 8.0 7.7  Total Bilirubin 0.3 - 1.2 mg/dL 0.5 0.7 0.6  Alkaline Phos 38 - 126 U/L 59 56 51  AST 15 - 41 U/L 18 23 17   ALT 0 - 44 U/L 8 11 8   No results found for: PSA  DIAGNOSTIC IMAGING:  I have independently reviewed the scans and  discussed with the patient. No results found.   ASSESSMENT:  1.  Metastatic prostate cancer to the bones: -Presentation with PSA of 861 in July 2016.  Started on Eligard every 6 months at [facility]. -Bicalutamide 50 mg discontinued around 03/26/2019 as his PSA was rising. -Bone scan on 05/25/2019 did not show any evidence of metastatic disease. -Lupron 45 mg on 09/15/2019.   PLAN:  1.  Metastatic prostate cancer to the bones: -His PSA is continuing to improve.  Today it is 5.53, down from 6.88 previously. -He will come back in 3 weeks for his Lupron injection. -I will see him back in 4 months for follow-up.  2.  CKD: -His creatinine has improved to 3.79 from 5.53 previously.  3.  Left hip pain: -He complained of left hip pain for the past couple of months. -No trauma involved.  I have done x-rays in the office which showed mild degenerative changes but no fractures or significant metastatic lesions. -Likely musculoskeletal.  4.  Macrocytic anemia: -Combination anemia from CKD and early MDS. -He is receiving Epogen injections every other week under direction of Dr. Theador Hawthorne.   Orders placed this encounter:  No orders of the defined types were placed in this encounter.    Derek Jack, MD [place] [phone]   I, Milinda Antis, am acting as a scribe for Dr. Sanda Linger.  I, Derek Jack MD, have reviewed the above documentation for accuracy and completeness, and I agree with the above.

## 2020-02-29 ENCOUNTER — Encounter (HOSPITAL_COMMUNITY): Payer: Self-pay

## 2020-02-29 DIAGNOSIS — R809 Proteinuria, unspecified: Secondary | ICD-10-CM | POA: Diagnosis not present

## 2020-02-29 DIAGNOSIS — E1122 Type 2 diabetes mellitus with diabetic chronic kidney disease: Secondary | ICD-10-CM | POA: Diagnosis not present

## 2020-02-29 DIAGNOSIS — N189 Chronic kidney disease, unspecified: Secondary | ICD-10-CM | POA: Diagnosis not present

## 2020-02-29 DIAGNOSIS — D631 Anemia in chronic kidney disease: Secondary | ICD-10-CM | POA: Diagnosis not present

## 2020-02-29 DIAGNOSIS — I129 Hypertensive chronic kidney disease with stage 1 through stage 4 chronic kidney disease, or unspecified chronic kidney disease: Secondary | ICD-10-CM | POA: Diagnosis not present

## 2020-02-29 DIAGNOSIS — E1129 Type 2 diabetes mellitus with other diabetic kidney complication: Secondary | ICD-10-CM | POA: Diagnosis not present

## 2020-02-29 DIAGNOSIS — N185 Chronic kidney disease, stage 5: Secondary | ICD-10-CM | POA: Diagnosis not present

## 2020-02-29 NOTE — Progress Notes (Signed)
I have called and informed the patient's daughter, Shauna Hugh, of Odette Horns results from yesterday. Daughter asked about PSA level, which has not resulted. I will follow-up with her on Monday regarding PSA.

## 2020-03-03 ENCOUNTER — Encounter (HOSPITAL_COMMUNITY): Payer: Self-pay

## 2020-03-03 NOTE — Progress Notes (Signed)
Daughter called and updated regarding PSA per Dr. Delton Coombes. Daughter is appreciative of my call.

## 2020-03-06 DIAGNOSIS — N185 Chronic kidney disease, stage 5: Secondary | ICD-10-CM | POA: Diagnosis not present

## 2020-03-12 DIAGNOSIS — N184 Chronic kidney disease, stage 4 (severe): Secondary | ICD-10-CM | POA: Diagnosis not present

## 2020-03-12 DIAGNOSIS — Z299 Encounter for prophylactic measures, unspecified: Secondary | ICD-10-CM | POA: Diagnosis not present

## 2020-03-12 DIAGNOSIS — E1165 Type 2 diabetes mellitus with hyperglycemia: Secondary | ICD-10-CM | POA: Diagnosis not present

## 2020-03-12 DIAGNOSIS — E1122 Type 2 diabetes mellitus with diabetic chronic kidney disease: Secondary | ICD-10-CM | POA: Diagnosis not present

## 2020-03-12 DIAGNOSIS — L89322 Pressure ulcer of left buttock, stage 2: Secondary | ICD-10-CM | POA: Diagnosis not present

## 2020-03-13 ENCOUNTER — Ambulatory Visit (HOSPITAL_COMMUNITY): Payer: No Typology Code available for payment source

## 2020-03-13 ENCOUNTER — Other Ambulatory Visit (HOSPITAL_COMMUNITY): Payer: No Typology Code available for payment source

## 2020-03-13 ENCOUNTER — Ambulatory Visit (HOSPITAL_COMMUNITY): Payer: No Typology Code available for payment source | Admitting: Hematology

## 2020-03-14 DIAGNOSIS — I5030 Unspecified diastolic (congestive) heart failure: Secondary | ICD-10-CM | POA: Diagnosis not present

## 2020-03-14 DIAGNOSIS — I69354 Hemiplegia and hemiparesis following cerebral infarction affecting left non-dominant side: Secondary | ICD-10-CM | POA: Diagnosis not present

## 2020-03-14 DIAGNOSIS — N184 Chronic kidney disease, stage 4 (severe): Secondary | ICD-10-CM | POA: Diagnosis not present

## 2020-03-14 DIAGNOSIS — M25551 Pain in right hip: Secondary | ICD-10-CM | POA: Diagnosis not present

## 2020-03-14 DIAGNOSIS — D638 Anemia in other chronic diseases classified elsewhere: Secondary | ICD-10-CM | POA: Diagnosis not present

## 2020-03-14 DIAGNOSIS — M1710 Unilateral primary osteoarthritis, unspecified knee: Secondary | ICD-10-CM | POA: Diagnosis not present

## 2020-03-14 DIAGNOSIS — M169 Osteoarthritis of hip, unspecified: Secondary | ICD-10-CM | POA: Diagnosis not present

## 2020-03-14 DIAGNOSIS — E1122 Type 2 diabetes mellitus with diabetic chronic kidney disease: Secondary | ICD-10-CM | POA: Diagnosis not present

## 2020-03-14 DIAGNOSIS — L89322 Pressure ulcer of left buttock, stage 2: Secondary | ICD-10-CM | POA: Diagnosis not present

## 2020-03-14 DIAGNOSIS — M1030 Gout due to renal impairment, unspecified site: Secondary | ICD-10-CM | POA: Diagnosis not present

## 2020-03-14 DIAGNOSIS — I351 Nonrheumatic aortic (valve) insufficiency: Secondary | ICD-10-CM | POA: Diagnosis not present

## 2020-03-14 DIAGNOSIS — E1165 Type 2 diabetes mellitus with hyperglycemia: Secondary | ICD-10-CM | POA: Diagnosis not present

## 2020-03-14 DIAGNOSIS — M545 Low back pain: Secondary | ICD-10-CM | POA: Diagnosis not present

## 2020-03-14 DIAGNOSIS — E1140 Type 2 diabetes mellitus with diabetic neuropathy, unspecified: Secondary | ICD-10-CM | POA: Diagnosis not present

## 2020-03-14 DIAGNOSIS — C61 Malignant neoplasm of prostate: Secondary | ICD-10-CM | POA: Diagnosis not present

## 2020-03-14 DIAGNOSIS — M542 Cervicalgia: Secondary | ICD-10-CM | POA: Diagnosis not present

## 2020-03-14 DIAGNOSIS — M25552 Pain in left hip: Secondary | ICD-10-CM | POA: Diagnosis not present

## 2020-03-14 DIAGNOSIS — I723 Aneurysm of iliac artery: Secondary | ICD-10-CM | POA: Diagnosis not present

## 2020-03-14 DIAGNOSIS — M47816 Spondylosis without myelopathy or radiculopathy, lumbar region: Secondary | ICD-10-CM | POA: Diagnosis not present

## 2020-03-14 DIAGNOSIS — K219 Gastro-esophageal reflux disease without esophagitis: Secondary | ICD-10-CM | POA: Diagnosis not present

## 2020-03-14 DIAGNOSIS — Z7984 Long term (current) use of oral hypoglycemic drugs: Secondary | ICD-10-CM | POA: Diagnosis not present

## 2020-03-18 DIAGNOSIS — L89322 Pressure ulcer of left buttock, stage 2: Secondary | ICD-10-CM | POA: Diagnosis not present

## 2020-03-18 DIAGNOSIS — I5030 Unspecified diastolic (congestive) heart failure: Secondary | ICD-10-CM | POA: Diagnosis not present

## 2020-03-18 DIAGNOSIS — E1140 Type 2 diabetes mellitus with diabetic neuropathy, unspecified: Secondary | ICD-10-CM | POA: Diagnosis not present

## 2020-03-18 DIAGNOSIS — E1165 Type 2 diabetes mellitus with hyperglycemia: Secondary | ICD-10-CM | POA: Diagnosis not present

## 2020-03-18 DIAGNOSIS — E1122 Type 2 diabetes mellitus with diabetic chronic kidney disease: Secondary | ICD-10-CM | POA: Diagnosis not present

## 2020-03-18 DIAGNOSIS — N184 Chronic kidney disease, stage 4 (severe): Secondary | ICD-10-CM | POA: Diagnosis not present

## 2020-03-19 ENCOUNTER — Inpatient Hospital Stay (HOSPITAL_COMMUNITY): Payer: No Typology Code available for payment source | Attending: Hematology

## 2020-03-19 ENCOUNTER — Encounter (HOSPITAL_COMMUNITY): Payer: Self-pay

## 2020-03-19 ENCOUNTER — Other Ambulatory Visit (HOSPITAL_COMMUNITY): Payer: No Typology Code available for payment source

## 2020-03-19 ENCOUNTER — Other Ambulatory Visit: Payer: Self-pay

## 2020-03-19 ENCOUNTER — Ambulatory Visit (HOSPITAL_COMMUNITY): Payer: No Typology Code available for payment source | Admitting: Hematology

## 2020-03-19 VITALS — BP 94/55 | HR 92 | Temp 96.9°F | Resp 18

## 2020-03-19 DIAGNOSIS — D539 Nutritional anemia, unspecified: Secondary | ICD-10-CM | POA: Diagnosis not present

## 2020-03-19 DIAGNOSIS — Z79899 Other long term (current) drug therapy: Secondary | ICD-10-CM | POA: Diagnosis not present

## 2020-03-19 DIAGNOSIS — C7951 Secondary malignant neoplasm of bone: Secondary | ICD-10-CM | POA: Insufficient documentation

## 2020-03-19 DIAGNOSIS — Z8349 Family history of other endocrine, nutritional and metabolic diseases: Secondary | ICD-10-CM | POA: Insufficient documentation

## 2020-03-19 DIAGNOSIS — Z5111 Encounter for antineoplastic chemotherapy: Secondary | ICD-10-CM | POA: Diagnosis not present

## 2020-03-19 DIAGNOSIS — N183 Chronic kidney disease, stage 3 unspecified: Secondary | ICD-10-CM | POA: Diagnosis not present

## 2020-03-19 DIAGNOSIS — M25559 Pain in unspecified hip: Secondary | ICD-10-CM | POA: Insufficient documentation

## 2020-03-19 DIAGNOSIS — Z809 Family history of malignant neoplasm, unspecified: Secondary | ICD-10-CM | POA: Diagnosis not present

## 2020-03-19 DIAGNOSIS — R630 Anorexia: Secondary | ICD-10-CM | POA: Insufficient documentation

## 2020-03-19 DIAGNOSIS — Z808 Family history of malignant neoplasm of other organs or systems: Secondary | ICD-10-CM | POA: Diagnosis not present

## 2020-03-19 DIAGNOSIS — Z833 Family history of diabetes mellitus: Secondary | ICD-10-CM | POA: Diagnosis not present

## 2020-03-19 DIAGNOSIS — G479 Sleep disorder, unspecified: Secondary | ICD-10-CM | POA: Diagnosis not present

## 2020-03-19 DIAGNOSIS — K5900 Constipation, unspecified: Secondary | ICD-10-CM | POA: Diagnosis not present

## 2020-03-19 DIAGNOSIS — C61 Malignant neoplasm of prostate: Secondary | ICD-10-CM | POA: Insufficient documentation

## 2020-03-19 DIAGNOSIS — M2550 Pain in unspecified joint: Secondary | ICD-10-CM | POA: Insufficient documentation

## 2020-03-19 MED ORDER — LEUPROLIDE ACETATE (6 MONTH) 45 MG IM KIT: 45.0000 mg | PACK | Freq: Once | INTRAMUSCULAR | Status: DC

## 2020-03-19 MED ORDER — LEUPROLIDE ACETATE (6 MONTH) 45 MG ~~LOC~~ KIT
45.0000 mg | PACK | Freq: Once | SUBCUTANEOUS | Status: AC
  Administered 2020-03-19: 45 mg via SUBCUTANEOUS
  Filled 2020-03-19: qty 45

## 2020-03-19 NOTE — Progress Notes (Signed)
Dayton Scrape tolerated Eligard injection well without complaints or incident. VSS Pt discharged via wheelchair in satisfactory condition accompanied by family member

## 2020-03-19 NOTE — Patient Instructions (Addendum)
[place] at [facility] Discharge Instructions  Received Eligard injection today. Follow-up as scheduled   Thank you for choosing [place] at [facility] to provide your oncology and hematology care.  To afford each patient quality time with our provider, please arrive at least 15 minutes before your scheduled appointment time.   If you have a lab appointment with the [facility] please come in thru the Main Entrance and check in at the main information desk.  You need to re-schedule your appointment should you arrive 10 or more minutes late.  We strive to give you quality time with our providers, and arriving late affects you and other patients whose appointments are after yours.  Also, if you no show three or more times for appointments you may be dismissed from the clinic at the providers discretion.     Again, thank you for choosing [facility].  Our hope is that these requests will decrease the amount of time that you wait before being seen by our physicians.       _____________________________________________________________  Should you have questions after your visit to [facility], please contact our office at [phone] and follow the prompts.  Our office hours are 8:00 a.m. and 4:30 p.m. Monday - Friday.  Please note that voicemails left after 4:00 p.m. may not be returned until the following business day.  We are closed weekends and major holidays.  You do have access to a nurse 24-7, just call the main number to the clinic [phone] and do not press any options, hold on the line and a nurse will answer the phone.    For prescription refill requests, have your pharmacy contact our office and allow 72 hours.    Due to Covid, you will need to wear a mask upon entering the hospital. If you do not have a mask, a mask will be given to you at the Main Entrance upon arrival. For doctor visits, patients may have  1 support person age 54 or older with them. For treatment visits, patients can not have anyone with them due to social distancing guidelines and our immunocompromised population.

## 2020-03-21 DIAGNOSIS — N184 Chronic kidney disease, stage 4 (severe): Secondary | ICD-10-CM | POA: Diagnosis not present

## 2020-03-21 DIAGNOSIS — N185 Chronic kidney disease, stage 5: Secondary | ICD-10-CM | POA: Diagnosis not present

## 2020-03-21 DIAGNOSIS — E1165 Type 2 diabetes mellitus with hyperglycemia: Secondary | ICD-10-CM | POA: Diagnosis not present

## 2020-03-21 DIAGNOSIS — L89322 Pressure ulcer of left buttock, stage 2: Secondary | ICD-10-CM | POA: Diagnosis not present

## 2020-03-21 DIAGNOSIS — I5030 Unspecified diastolic (congestive) heart failure: Secondary | ICD-10-CM | POA: Diagnosis not present

## 2020-03-21 DIAGNOSIS — E1140 Type 2 diabetes mellitus with diabetic neuropathy, unspecified: Secondary | ICD-10-CM | POA: Diagnosis not present

## 2020-03-21 DIAGNOSIS — E1122 Type 2 diabetes mellitus with diabetic chronic kidney disease: Secondary | ICD-10-CM | POA: Diagnosis not present

## 2020-03-25 DIAGNOSIS — L89322 Pressure ulcer of left buttock, stage 2: Secondary | ICD-10-CM | POA: Diagnosis not present

## 2020-03-25 DIAGNOSIS — E1165 Type 2 diabetes mellitus with hyperglycemia: Secondary | ICD-10-CM | POA: Diagnosis not present

## 2020-03-25 DIAGNOSIS — N184 Chronic kidney disease, stage 4 (severe): Secondary | ICD-10-CM | POA: Diagnosis not present

## 2020-03-25 DIAGNOSIS — I5030 Unspecified diastolic (congestive) heart failure: Secondary | ICD-10-CM | POA: Diagnosis not present

## 2020-03-25 DIAGNOSIS — E1140 Type 2 diabetes mellitus with diabetic neuropathy, unspecified: Secondary | ICD-10-CM | POA: Diagnosis not present

## 2020-03-25 DIAGNOSIS — E1122 Type 2 diabetes mellitus with diabetic chronic kidney disease: Secondary | ICD-10-CM | POA: Diagnosis not present

## 2020-03-31 DIAGNOSIS — I5030 Unspecified diastolic (congestive) heart failure: Secondary | ICD-10-CM | POA: Diagnosis not present

## 2020-03-31 DIAGNOSIS — N184 Chronic kidney disease, stage 4 (severe): Secondary | ICD-10-CM | POA: Diagnosis not present

## 2020-03-31 DIAGNOSIS — L89322 Pressure ulcer of left buttock, stage 2: Secondary | ICD-10-CM | POA: Diagnosis not present

## 2020-03-31 DIAGNOSIS — E1122 Type 2 diabetes mellitus with diabetic chronic kidney disease: Secondary | ICD-10-CM | POA: Diagnosis not present

## 2020-04-01 DIAGNOSIS — I5030 Unspecified diastolic (congestive) heart failure: Secondary | ICD-10-CM | POA: Diagnosis not present

## 2020-04-01 DIAGNOSIS — E1140 Type 2 diabetes mellitus with diabetic neuropathy, unspecified: Secondary | ICD-10-CM | POA: Diagnosis not present

## 2020-04-01 DIAGNOSIS — N184 Chronic kidney disease, stage 4 (severe): Secondary | ICD-10-CM | POA: Diagnosis not present

## 2020-04-01 DIAGNOSIS — E1165 Type 2 diabetes mellitus with hyperglycemia: Secondary | ICD-10-CM | POA: Diagnosis not present

## 2020-04-01 DIAGNOSIS — L89322 Pressure ulcer of left buttock, stage 2: Secondary | ICD-10-CM | POA: Diagnosis not present

## 2020-04-01 DIAGNOSIS — E1122 Type 2 diabetes mellitus with diabetic chronic kidney disease: Secondary | ICD-10-CM | POA: Diagnosis not present

## 2020-04-03 DIAGNOSIS — N185 Chronic kidney disease, stage 5: Secondary | ICD-10-CM | POA: Diagnosis not present

## 2020-04-08 DIAGNOSIS — E1165 Type 2 diabetes mellitus with hyperglycemia: Secondary | ICD-10-CM | POA: Diagnosis not present

## 2020-04-08 DIAGNOSIS — I5030 Unspecified diastolic (congestive) heart failure: Secondary | ICD-10-CM | POA: Diagnosis not present

## 2020-04-08 DIAGNOSIS — L89322 Pressure ulcer of left buttock, stage 2: Secondary | ICD-10-CM | POA: Diagnosis not present

## 2020-04-08 DIAGNOSIS — N184 Chronic kidney disease, stage 4 (severe): Secondary | ICD-10-CM | POA: Diagnosis not present

## 2020-04-08 DIAGNOSIS — E1140 Type 2 diabetes mellitus with diabetic neuropathy, unspecified: Secondary | ICD-10-CM | POA: Diagnosis not present

## 2020-04-08 DIAGNOSIS — E1122 Type 2 diabetes mellitus with diabetic chronic kidney disease: Secondary | ICD-10-CM | POA: Diagnosis not present

## 2020-04-09 DIAGNOSIS — E1140 Type 2 diabetes mellitus with diabetic neuropathy, unspecified: Secondary | ICD-10-CM | POA: Diagnosis not present

## 2020-04-09 DIAGNOSIS — M79675 Pain in left toe(s): Secondary | ICD-10-CM | POA: Diagnosis not present

## 2020-04-09 DIAGNOSIS — E1151 Type 2 diabetes mellitus with diabetic peripheral angiopathy without gangrene: Secondary | ICD-10-CM | POA: Diagnosis not present

## 2020-04-09 DIAGNOSIS — M79672 Pain in left foot: Secondary | ICD-10-CM | POA: Diagnosis not present

## 2020-04-09 DIAGNOSIS — L89893 Pressure ulcer of other site, stage 3: Secondary | ICD-10-CM | POA: Diagnosis not present

## 2020-04-13 DIAGNOSIS — I351 Nonrheumatic aortic (valve) insufficiency: Secondary | ICD-10-CM | POA: Diagnosis not present

## 2020-04-13 DIAGNOSIS — I5030 Unspecified diastolic (congestive) heart failure: Secondary | ICD-10-CM | POA: Diagnosis not present

## 2020-04-13 DIAGNOSIS — M542 Cervicalgia: Secondary | ICD-10-CM | POA: Diagnosis not present

## 2020-04-13 DIAGNOSIS — M545 Low back pain, unspecified: Secondary | ICD-10-CM | POA: Diagnosis not present

## 2020-04-13 DIAGNOSIS — Z7984 Long term (current) use of oral hypoglycemic drugs: Secondary | ICD-10-CM | POA: Diagnosis not present

## 2020-04-13 DIAGNOSIS — M169 Osteoarthritis of hip, unspecified: Secondary | ICD-10-CM | POA: Diagnosis not present

## 2020-04-13 DIAGNOSIS — I69354 Hemiplegia and hemiparesis following cerebral infarction affecting left non-dominant side: Secondary | ICD-10-CM | POA: Diagnosis not present

## 2020-04-13 DIAGNOSIS — M25552 Pain in left hip: Secondary | ICD-10-CM | POA: Diagnosis not present

## 2020-04-13 DIAGNOSIS — E1140 Type 2 diabetes mellitus with diabetic neuropathy, unspecified: Secondary | ICD-10-CM | POA: Diagnosis not present

## 2020-04-13 DIAGNOSIS — I723 Aneurysm of iliac artery: Secondary | ICD-10-CM | POA: Diagnosis not present

## 2020-04-13 DIAGNOSIS — K219 Gastro-esophageal reflux disease without esophagitis: Secondary | ICD-10-CM | POA: Diagnosis not present

## 2020-04-13 DIAGNOSIS — E1122 Type 2 diabetes mellitus with diabetic chronic kidney disease: Secondary | ICD-10-CM | POA: Diagnosis not present

## 2020-04-13 DIAGNOSIS — M1030 Gout due to renal impairment, unspecified site: Secondary | ICD-10-CM | POA: Diagnosis not present

## 2020-04-13 DIAGNOSIS — N184 Chronic kidney disease, stage 4 (severe): Secondary | ICD-10-CM | POA: Diagnosis not present

## 2020-04-13 DIAGNOSIS — L89322 Pressure ulcer of left buttock, stage 2: Secondary | ICD-10-CM | POA: Diagnosis not present

## 2020-04-13 DIAGNOSIS — E1165 Type 2 diabetes mellitus with hyperglycemia: Secondary | ICD-10-CM | POA: Diagnosis not present

## 2020-04-13 DIAGNOSIS — C61 Malignant neoplasm of prostate: Secondary | ICD-10-CM | POA: Diagnosis not present

## 2020-04-13 DIAGNOSIS — D638 Anemia in other chronic diseases classified elsewhere: Secondary | ICD-10-CM | POA: Diagnosis not present

## 2020-04-13 DIAGNOSIS — M25551 Pain in right hip: Secondary | ICD-10-CM | POA: Diagnosis not present

## 2020-04-13 DIAGNOSIS — M47816 Spondylosis without myelopathy or radiculopathy, lumbar region: Secondary | ICD-10-CM | POA: Diagnosis not present

## 2020-04-13 DIAGNOSIS — M1710 Unilateral primary osteoarthritis, unspecified knee: Secondary | ICD-10-CM | POA: Diagnosis not present

## 2020-04-15 DIAGNOSIS — E1165 Type 2 diabetes mellitus with hyperglycemia: Secondary | ICD-10-CM | POA: Diagnosis not present

## 2020-04-15 DIAGNOSIS — L89322 Pressure ulcer of left buttock, stage 2: Secondary | ICD-10-CM | POA: Diagnosis not present

## 2020-04-15 DIAGNOSIS — E1122 Type 2 diabetes mellitus with diabetic chronic kidney disease: Secondary | ICD-10-CM | POA: Diagnosis not present

## 2020-04-15 DIAGNOSIS — I5030 Unspecified diastolic (congestive) heart failure: Secondary | ICD-10-CM | POA: Diagnosis not present

## 2020-04-15 DIAGNOSIS — N184 Chronic kidney disease, stage 4 (severe): Secondary | ICD-10-CM | POA: Diagnosis not present

## 2020-04-15 DIAGNOSIS — E1140 Type 2 diabetes mellitus with diabetic neuropathy, unspecified: Secondary | ICD-10-CM | POA: Diagnosis not present

## 2020-04-17 DIAGNOSIS — N185 Chronic kidney disease, stage 5: Secondary | ICD-10-CM | POA: Diagnosis not present

## 2020-04-18 DIAGNOSIS — R809 Proteinuria, unspecified: Secondary | ICD-10-CM | POA: Diagnosis not present

## 2020-04-18 DIAGNOSIS — I129 Hypertensive chronic kidney disease with stage 1 through stage 4 chronic kidney disease, or unspecified chronic kidney disease: Secondary | ICD-10-CM | POA: Diagnosis not present

## 2020-04-18 DIAGNOSIS — E1122 Type 2 diabetes mellitus with diabetic chronic kidney disease: Secondary | ICD-10-CM | POA: Diagnosis not present

## 2020-04-18 DIAGNOSIS — D631 Anemia in chronic kidney disease: Secondary | ICD-10-CM | POA: Diagnosis not present

## 2020-04-18 DIAGNOSIS — E1129 Type 2 diabetes mellitus with other diabetic kidney complication: Secondary | ICD-10-CM | POA: Diagnosis not present

## 2020-04-18 DIAGNOSIS — N185 Chronic kidney disease, stage 5: Secondary | ICD-10-CM | POA: Diagnosis not present

## 2020-04-18 DIAGNOSIS — N189 Chronic kidney disease, unspecified: Secondary | ICD-10-CM | POA: Diagnosis not present

## 2020-04-22 DIAGNOSIS — I5030 Unspecified diastolic (congestive) heart failure: Secondary | ICD-10-CM | POA: Diagnosis not present

## 2020-04-22 DIAGNOSIS — L89322 Pressure ulcer of left buttock, stage 2: Secondary | ICD-10-CM | POA: Diagnosis not present

## 2020-04-22 DIAGNOSIS — E1122 Type 2 diabetes mellitus with diabetic chronic kidney disease: Secondary | ICD-10-CM | POA: Diagnosis not present

## 2020-04-22 DIAGNOSIS — E1165 Type 2 diabetes mellitus with hyperglycemia: Secondary | ICD-10-CM | POA: Diagnosis not present

## 2020-04-22 DIAGNOSIS — N184 Chronic kidney disease, stage 4 (severe): Secondary | ICD-10-CM | POA: Diagnosis not present

## 2020-04-22 DIAGNOSIS — E1140 Type 2 diabetes mellitus with diabetic neuropathy, unspecified: Secondary | ICD-10-CM | POA: Diagnosis not present

## 2020-05-01 DIAGNOSIS — N185 Chronic kidney disease, stage 5: Secondary | ICD-10-CM | POA: Diagnosis not present

## 2020-05-07 DIAGNOSIS — N170 Acute kidney failure with tubular necrosis: Secondary | ICD-10-CM | POA: Diagnosis not present

## 2020-05-07 DIAGNOSIS — N185 Chronic kidney disease, stage 5: Secondary | ICD-10-CM | POA: Diagnosis not present

## 2020-05-07 DIAGNOSIS — G5792 Unspecified mononeuropathy of left lower limb: Secondary | ICD-10-CM | POA: Diagnosis not present

## 2020-05-07 DIAGNOSIS — E1122 Type 2 diabetes mellitus with diabetic chronic kidney disease: Secondary | ICD-10-CM | POA: Diagnosis not present

## 2020-05-07 DIAGNOSIS — D631 Anemia in chronic kidney disease: Secondary | ICD-10-CM | POA: Diagnosis not present

## 2020-05-07 DIAGNOSIS — N189 Chronic kidney disease, unspecified: Secondary | ICD-10-CM | POA: Diagnosis not present

## 2020-05-07 DIAGNOSIS — I129 Hypertensive chronic kidney disease with stage 1 through stage 4 chronic kidney disease, or unspecified chronic kidney disease: Secondary | ICD-10-CM | POA: Diagnosis not present

## 2020-05-08 DIAGNOSIS — E1122 Type 2 diabetes mellitus with diabetic chronic kidney disease: Secondary | ICD-10-CM | POA: Diagnosis not present

## 2020-05-08 DIAGNOSIS — E1140 Type 2 diabetes mellitus with diabetic neuropathy, unspecified: Secondary | ICD-10-CM | POA: Diagnosis not present

## 2020-05-08 DIAGNOSIS — I5030 Unspecified diastolic (congestive) heart failure: Secondary | ICD-10-CM | POA: Diagnosis not present

## 2020-05-08 DIAGNOSIS — E1165 Type 2 diabetes mellitus with hyperglycemia: Secondary | ICD-10-CM | POA: Diagnosis not present

## 2020-05-08 DIAGNOSIS — N184 Chronic kidney disease, stage 4 (severe): Secondary | ICD-10-CM | POA: Diagnosis not present

## 2020-05-08 DIAGNOSIS — L89322 Pressure ulcer of left buttock, stage 2: Secondary | ICD-10-CM | POA: Diagnosis not present

## 2020-05-09 DIAGNOSIS — G5712 Meralgia paresthetica, left lower limb: Secondary | ICD-10-CM | POA: Diagnosis not present

## 2020-05-09 DIAGNOSIS — I5030 Unspecified diastolic (congestive) heart failure: Secondary | ICD-10-CM | POA: Diagnosis not present

## 2020-05-09 DIAGNOSIS — Z299 Encounter for prophylactic measures, unspecified: Secondary | ICD-10-CM | POA: Diagnosis not present

## 2020-05-09 DIAGNOSIS — E1122 Type 2 diabetes mellitus with diabetic chronic kidney disease: Secondary | ICD-10-CM | POA: Diagnosis not present

## 2020-05-09 DIAGNOSIS — M79606 Pain in leg, unspecified: Secondary | ICD-10-CM | POA: Diagnosis not present

## 2020-05-09 DIAGNOSIS — Z6829 Body mass index (BMI) 29.0-29.9, adult: Secondary | ICD-10-CM | POA: Diagnosis not present

## 2020-05-13 DIAGNOSIS — C61 Malignant neoplasm of prostate: Secondary | ICD-10-CM | POA: Diagnosis not present

## 2020-05-13 DIAGNOSIS — M169 Osteoarthritis of hip, unspecified: Secondary | ICD-10-CM | POA: Diagnosis not present

## 2020-05-13 DIAGNOSIS — N184 Chronic kidney disease, stage 4 (severe): Secondary | ICD-10-CM | POA: Diagnosis not present

## 2020-05-13 DIAGNOSIS — M25552 Pain in left hip: Secondary | ICD-10-CM | POA: Diagnosis not present

## 2020-05-13 DIAGNOSIS — M1030 Gout due to renal impairment, unspecified site: Secondary | ICD-10-CM | POA: Diagnosis not present

## 2020-05-13 DIAGNOSIS — L89322 Pressure ulcer of left buttock, stage 2: Secondary | ICD-10-CM | POA: Diagnosis not present

## 2020-05-13 DIAGNOSIS — I69354 Hemiplegia and hemiparesis following cerebral infarction affecting left non-dominant side: Secondary | ICD-10-CM | POA: Diagnosis not present

## 2020-05-13 DIAGNOSIS — D638 Anemia in other chronic diseases classified elsewhere: Secondary | ICD-10-CM | POA: Diagnosis not present

## 2020-05-13 DIAGNOSIS — Z7984 Long term (current) use of oral hypoglycemic drugs: Secondary | ICD-10-CM | POA: Diagnosis not present

## 2020-05-13 DIAGNOSIS — M545 Low back pain, unspecified: Secondary | ICD-10-CM | POA: Diagnosis not present

## 2020-05-13 DIAGNOSIS — M47816 Spondylosis without myelopathy or radiculopathy, lumbar region: Secondary | ICD-10-CM | POA: Diagnosis not present

## 2020-05-13 DIAGNOSIS — M1710 Unilateral primary osteoarthritis, unspecified knee: Secondary | ICD-10-CM | POA: Diagnosis not present

## 2020-05-13 DIAGNOSIS — E1140 Type 2 diabetes mellitus with diabetic neuropathy, unspecified: Secondary | ICD-10-CM | POA: Diagnosis not present

## 2020-05-13 DIAGNOSIS — M25551 Pain in right hip: Secondary | ICD-10-CM | POA: Diagnosis not present

## 2020-05-13 DIAGNOSIS — K219 Gastro-esophageal reflux disease without esophagitis: Secondary | ICD-10-CM | POA: Diagnosis not present

## 2020-05-13 DIAGNOSIS — I5030 Unspecified diastolic (congestive) heart failure: Secondary | ICD-10-CM | POA: Diagnosis not present

## 2020-05-13 DIAGNOSIS — I723 Aneurysm of iliac artery: Secondary | ICD-10-CM | POA: Diagnosis not present

## 2020-05-13 DIAGNOSIS — M542 Cervicalgia: Secondary | ICD-10-CM | POA: Diagnosis not present

## 2020-05-13 DIAGNOSIS — I351 Nonrheumatic aortic (valve) insufficiency: Secondary | ICD-10-CM | POA: Diagnosis not present

## 2020-05-13 DIAGNOSIS — E1122 Type 2 diabetes mellitus with diabetic chronic kidney disease: Secondary | ICD-10-CM | POA: Diagnosis not present

## 2020-05-15 DIAGNOSIS — D631 Anemia in chronic kidney disease: Secondary | ICD-10-CM | POA: Diagnosis not present

## 2020-05-15 DIAGNOSIS — N185 Chronic kidney disease, stage 5: Secondary | ICD-10-CM | POA: Diagnosis not present

## 2020-05-16 DIAGNOSIS — E1140 Type 2 diabetes mellitus with diabetic neuropathy, unspecified: Secondary | ICD-10-CM | POA: Diagnosis not present

## 2020-05-16 DIAGNOSIS — I5030 Unspecified diastolic (congestive) heart failure: Secondary | ICD-10-CM | POA: Diagnosis not present

## 2020-05-16 DIAGNOSIS — E1122 Type 2 diabetes mellitus with diabetic chronic kidney disease: Secondary | ICD-10-CM | POA: Diagnosis not present

## 2020-05-16 DIAGNOSIS — N184 Chronic kidney disease, stage 4 (severe): Secondary | ICD-10-CM | POA: Diagnosis not present

## 2020-05-16 DIAGNOSIS — I69354 Hemiplegia and hemiparesis following cerebral infarction affecting left non-dominant side: Secondary | ICD-10-CM | POA: Diagnosis not present

## 2020-05-16 DIAGNOSIS — L89322 Pressure ulcer of left buttock, stage 2: Secondary | ICD-10-CM | POA: Diagnosis not present

## 2020-05-20 DIAGNOSIS — L89322 Pressure ulcer of left buttock, stage 2: Secondary | ICD-10-CM | POA: Diagnosis not present

## 2020-05-20 DIAGNOSIS — E1140 Type 2 diabetes mellitus with diabetic neuropathy, unspecified: Secondary | ICD-10-CM | POA: Diagnosis not present

## 2020-05-20 DIAGNOSIS — E1122 Type 2 diabetes mellitus with diabetic chronic kidney disease: Secondary | ICD-10-CM | POA: Diagnosis not present

## 2020-05-20 DIAGNOSIS — I5030 Unspecified diastolic (congestive) heart failure: Secondary | ICD-10-CM | POA: Diagnosis not present

## 2020-05-20 DIAGNOSIS — I69354 Hemiplegia and hemiparesis following cerebral infarction affecting left non-dominant side: Secondary | ICD-10-CM | POA: Diagnosis not present

## 2020-05-20 DIAGNOSIS — N184 Chronic kidney disease, stage 4 (severe): Secondary | ICD-10-CM | POA: Diagnosis not present

## 2020-05-28 DIAGNOSIS — I5030 Unspecified diastolic (congestive) heart failure: Secondary | ICD-10-CM | POA: Diagnosis not present

## 2020-05-28 DIAGNOSIS — N184 Chronic kidney disease, stage 4 (severe): Secondary | ICD-10-CM | POA: Diagnosis not present

## 2020-05-28 DIAGNOSIS — I69354 Hemiplegia and hemiparesis following cerebral infarction affecting left non-dominant side: Secondary | ICD-10-CM | POA: Diagnosis not present

## 2020-05-28 DIAGNOSIS — E1140 Type 2 diabetes mellitus with diabetic neuropathy, unspecified: Secondary | ICD-10-CM | POA: Diagnosis not present

## 2020-05-28 DIAGNOSIS — L89322 Pressure ulcer of left buttock, stage 2: Secondary | ICD-10-CM | POA: Diagnosis not present

## 2020-05-28 DIAGNOSIS — E1122 Type 2 diabetes mellitus with diabetic chronic kidney disease: Secondary | ICD-10-CM | POA: Diagnosis not present

## 2020-05-29 DIAGNOSIS — N185 Chronic kidney disease, stage 5: Secondary | ICD-10-CM | POA: Diagnosis not present

## 2020-06-03 DIAGNOSIS — D631 Anemia in chronic kidney disease: Secondary | ICD-10-CM | POA: Diagnosis not present

## 2020-06-03 DIAGNOSIS — E1165 Type 2 diabetes mellitus with hyperglycemia: Secondary | ICD-10-CM | POA: Diagnosis not present

## 2020-06-03 DIAGNOSIS — E1140 Type 2 diabetes mellitus with diabetic neuropathy, unspecified: Secondary | ICD-10-CM | POA: Diagnosis not present

## 2020-06-03 DIAGNOSIS — I5030 Unspecified diastolic (congestive) heart failure: Secondary | ICD-10-CM | POA: Diagnosis not present

## 2020-06-03 DIAGNOSIS — L89322 Pressure ulcer of left buttock, stage 2: Secondary | ICD-10-CM | POA: Diagnosis not present

## 2020-06-03 DIAGNOSIS — E1129 Type 2 diabetes mellitus with other diabetic kidney complication: Secondary | ICD-10-CM | POA: Diagnosis not present

## 2020-06-03 DIAGNOSIS — N189 Chronic kidney disease, unspecified: Secondary | ICD-10-CM | POA: Diagnosis not present

## 2020-06-03 DIAGNOSIS — N185 Chronic kidney disease, stage 5: Secondary | ICD-10-CM | POA: Diagnosis not present

## 2020-06-03 DIAGNOSIS — N184 Chronic kidney disease, stage 4 (severe): Secondary | ICD-10-CM | POA: Diagnosis not present

## 2020-06-03 DIAGNOSIS — I69354 Hemiplegia and hemiparesis following cerebral infarction affecting left non-dominant side: Secondary | ICD-10-CM | POA: Diagnosis not present

## 2020-06-03 DIAGNOSIS — N170 Acute kidney failure with tubular necrosis: Secondary | ICD-10-CM | POA: Diagnosis not present

## 2020-06-03 DIAGNOSIS — I129 Hypertensive chronic kidney disease with stage 1 through stage 4 chronic kidney disease, or unspecified chronic kidney disease: Secondary | ICD-10-CM | POA: Diagnosis not present

## 2020-06-03 DIAGNOSIS — Z299 Encounter for prophylactic measures, unspecified: Secondary | ICD-10-CM | POA: Diagnosis not present

## 2020-06-03 DIAGNOSIS — R809 Proteinuria, unspecified: Secondary | ICD-10-CM | POA: Diagnosis not present

## 2020-06-03 DIAGNOSIS — E1122 Type 2 diabetes mellitus with diabetic chronic kidney disease: Secondary | ICD-10-CM | POA: Diagnosis not present

## 2020-06-12 ENCOUNTER — Inpatient Hospital Stay (HOSPITAL_COMMUNITY): Payer: No Typology Code available for payment source | Attending: Hematology

## 2020-06-12 ENCOUNTER — Inpatient Hospital Stay (HOSPITAL_BASED_OUTPATIENT_CLINIC_OR_DEPARTMENT_OTHER): Payer: No Typology Code available for payment source | Admitting: Hematology

## 2020-06-12 ENCOUNTER — Other Ambulatory Visit: Payer: Self-pay

## 2020-06-12 VITALS — BP 90/57 | HR 97 | Resp 20

## 2020-06-12 DIAGNOSIS — Z833 Family history of diabetes mellitus: Secondary | ICD-10-CM | POA: Insufficient documentation

## 2020-06-12 DIAGNOSIS — Z8349 Family history of other endocrine, nutritional and metabolic diseases: Secondary | ICD-10-CM | POA: Diagnosis not present

## 2020-06-12 DIAGNOSIS — Z809 Family history of malignant neoplasm, unspecified: Secondary | ICD-10-CM | POA: Insufficient documentation

## 2020-06-12 DIAGNOSIS — N183 Chronic kidney disease, stage 3 unspecified: Secondary | ICD-10-CM | POA: Diagnosis not present

## 2020-06-12 DIAGNOSIS — M545 Low back pain, unspecified: Secondary | ICD-10-CM | POA: Diagnosis not present

## 2020-06-12 DIAGNOSIS — Z808 Family history of malignant neoplasm of other organs or systems: Secondary | ICD-10-CM | POA: Insufficient documentation

## 2020-06-12 DIAGNOSIS — C61 Malignant neoplasm of prostate: Secondary | ICD-10-CM | POA: Insufficient documentation

## 2020-06-12 DIAGNOSIS — D638 Anemia in other chronic diseases classified elsewhere: Secondary | ICD-10-CM | POA: Diagnosis not present

## 2020-06-12 DIAGNOSIS — Z23 Encounter for immunization: Secondary | ICD-10-CM | POA: Diagnosis not present

## 2020-06-12 DIAGNOSIS — N184 Chronic kidney disease, stage 4 (severe): Secondary | ICD-10-CM | POA: Diagnosis not present

## 2020-06-12 DIAGNOSIS — M1030 Gout due to renal impairment, unspecified site: Secondary | ICD-10-CM | POA: Diagnosis not present

## 2020-06-12 DIAGNOSIS — R0602 Shortness of breath: Secondary | ICD-10-CM | POA: Insufficient documentation

## 2020-06-12 DIAGNOSIS — R5383 Other fatigue: Secondary | ICD-10-CM | POA: Diagnosis not present

## 2020-06-12 DIAGNOSIS — R059 Cough, unspecified: Secondary | ICD-10-CM | POA: Diagnosis not present

## 2020-06-12 DIAGNOSIS — D539 Nutritional anemia, unspecified: Secondary | ICD-10-CM

## 2020-06-12 DIAGNOSIS — M47816 Spondylosis without myelopathy or radiculopathy, lumbar region: Secondary | ICD-10-CM | POA: Diagnosis not present

## 2020-06-12 DIAGNOSIS — D649 Anemia, unspecified: Secondary | ICD-10-CM | POA: Insufficient documentation

## 2020-06-12 DIAGNOSIS — Z79899 Other long term (current) drug therapy: Secondary | ICD-10-CM | POA: Insufficient documentation

## 2020-06-12 DIAGNOSIS — C7951 Secondary malignant neoplasm of bone: Secondary | ICD-10-CM | POA: Diagnosis not present

## 2020-06-12 DIAGNOSIS — Z7984 Long term (current) use of oral hypoglycemic drugs: Secondary | ICD-10-CM | POA: Diagnosis not present

## 2020-06-12 DIAGNOSIS — I723 Aneurysm of iliac artery: Secondary | ICD-10-CM | POA: Diagnosis not present

## 2020-06-12 DIAGNOSIS — E1140 Type 2 diabetes mellitus with diabetic neuropathy, unspecified: Secondary | ICD-10-CM | POA: Diagnosis not present

## 2020-06-12 DIAGNOSIS — M1710 Unilateral primary osteoarthritis, unspecified knee: Secondary | ICD-10-CM | POA: Diagnosis not present

## 2020-06-12 DIAGNOSIS — Z8673 Personal history of transient ischemic attack (TIA), and cerebral infarction without residual deficits: Secondary | ICD-10-CM | POA: Diagnosis not present

## 2020-06-12 DIAGNOSIS — I69354 Hemiplegia and hemiparesis following cerebral infarction affecting left non-dominant side: Secondary | ICD-10-CM | POA: Diagnosis not present

## 2020-06-12 DIAGNOSIS — L89322 Pressure ulcer of left buttock, stage 2: Secondary | ICD-10-CM | POA: Diagnosis not present

## 2020-06-12 DIAGNOSIS — I351 Nonrheumatic aortic (valve) insufficiency: Secondary | ICD-10-CM | POA: Diagnosis not present

## 2020-06-12 DIAGNOSIS — K219 Gastro-esophageal reflux disease without esophagitis: Secondary | ICD-10-CM | POA: Diagnosis not present

## 2020-06-12 DIAGNOSIS — G629 Polyneuropathy, unspecified: Secondary | ICD-10-CM | POA: Insufficient documentation

## 2020-06-12 DIAGNOSIS — I5030 Unspecified diastolic (congestive) heart failure: Secondary | ICD-10-CM | POA: Diagnosis not present

## 2020-06-12 DIAGNOSIS — E1122 Type 2 diabetes mellitus with diabetic chronic kidney disease: Secondary | ICD-10-CM | POA: Diagnosis not present

## 2020-06-12 DIAGNOSIS — M25552 Pain in left hip: Secondary | ICD-10-CM | POA: Diagnosis not present

## 2020-06-12 DIAGNOSIS — M25551 Pain in right hip: Secondary | ICD-10-CM | POA: Diagnosis not present

## 2020-06-12 DIAGNOSIS — M169 Osteoarthritis of hip, unspecified: Secondary | ICD-10-CM | POA: Diagnosis not present

## 2020-06-12 DIAGNOSIS — M542 Cervicalgia: Secondary | ICD-10-CM | POA: Diagnosis not present

## 2020-06-12 LAB — COMPREHENSIVE METABOLIC PANEL
ALT: 7 U/L (ref 0–44)
AST: 19 U/L (ref 15–41)
Albumin: 3.9 g/dL (ref 3.5–5.0)
Alkaline Phosphatase: 57 U/L (ref 38–126)
Anion gap: 13 (ref 5–15)
BUN: 53 mg/dL — ABNORMAL HIGH (ref 8–23)
CO2: 27 mmol/L (ref 22–32)
Calcium: 9.6 mg/dL (ref 8.9–10.3)
Chloride: 98 mmol/L (ref 98–111)
Creatinine, Ser: 3.71 mg/dL — ABNORMAL HIGH (ref 0.61–1.24)
GFR, Estimated: 14 mL/min — ABNORMAL LOW (ref >60)
Glucose, Bld: 212 mg/dL — ABNORMAL HIGH (ref 70–99)
Potassium: 3.7 mmol/L (ref 3.5–5.1)
Sodium: 138 mmol/L (ref 135–145)
Total Bilirubin: 0.6 mg/dL (ref 0.3–1.2)
Total Protein: 8 g/dL (ref 6.5–8.1)

## 2020-06-12 LAB — CBC WITH DIFFERENTIAL/PLATELET
Abs Immature Granulocytes: 0.09 10*3/uL — ABNORMAL HIGH (ref 0.00–0.07)
Basophils Absolute: 0.1 10*3/uL (ref 0.0–0.1)
Basophils Relative: 1 %
Eosinophils Absolute: 0.3 10*3/uL (ref 0.0–0.5)
Eosinophils Relative: 4 %
HCT: 35.7 % — ABNORMAL LOW (ref 39.0–52.0)
Hemoglobin: 10.6 g/dL — ABNORMAL LOW (ref 13.0–17.0)
Immature Granulocytes: 2 %
Lymphocytes Relative: 24 %
Lymphs Abs: 1.4 10*3/uL (ref 0.7–4.0)
MCH: 29.4 pg (ref 26.0–34.0)
MCHC: 29.7 g/dL — ABNORMAL LOW (ref 30.0–36.0)
MCV: 98.9 fL (ref 80.0–100.0)
Monocytes Absolute: 0.5 10*3/uL (ref 0.1–1.0)
Monocytes Relative: 9 %
Neutro Abs: 3.6 10*3/uL (ref 1.7–7.7)
Neutrophils Relative %: 60 %
Platelets: 202 10*3/uL (ref 150–400)
RBC: 3.61 MIL/uL — ABNORMAL LOW (ref 4.22–5.81)
RDW: 15.1 % (ref 11.5–15.5)
WBC: 5.9 10*3/uL (ref 4.0–10.5)
nRBC: 0 % (ref 0.0–0.2)

## 2020-06-12 LAB — PSA: Prostatic Specific Antigen: 7.12 ng/mL — ABNORMAL HIGH (ref 0.00–4.00)

## 2020-06-12 MED ORDER — INFLUENZA VAC A&B SA ADJ QUAD 0.5 ML IM PRSY
0.5000 mL | PREFILLED_SYRINGE | Freq: Once | INTRAMUSCULAR | Status: AC
  Administered 2020-06-12: 0.5 mL via INTRAMUSCULAR
  Filled 2020-06-12: qty 0.5

## 2020-06-12 NOTE — Patient Instructions (Signed)
[facility] Discharge Instructions  You were seen today by Dr. Delton Coombes. He went over your recent results. Your next appointment will be with the nurse practitioner in March 2022 for labs and follow up.   Thank you for choosing [place] at [facility] to provide your oncology and hematology care.  To afford each patient quality time with our provider, please arrive at least 15 minutes before your scheduled appointment time.   If you have a lab appointment with the [facility] please come in thru the Main Entrance and check in at the main information desk  You need to re-schedule your appointment should you arrive 10 or more minutes late.  We strive to give you quality time with our providers, and arriving late affects you and other patients whose appointments are after yours.  Also, if you no show three or more times for appointments you may be dismissed from the clinic at the providers discretion.     Again, thank you for choosing [facility].  Our hope is that these requests will decrease the amount of time that you wait before being seen by our physicians.       _____________________________________________________________  Should you have questions after your visit to [facility], please contact our office at [phone] between the hours of 8:00 a.m. and 4:30 p.m.  Voicemails left after 4:00 p.m. will not be returned until the following business day.  For prescription refill requests, have your pharmacy contact our office and allow 72 hours.    Cancer Center Support Programs:   > Cancer Support Group  2nd Tuesday of the month 1pm-2pm, Journey Room

## 2020-06-12 NOTE — Progress Notes (Signed)
Cameron Phelps, [place] [address]   CLINIC:  Medical Oncology/Hematology  PCP:  Glenda Chroman, MD [address] / [place] [address] [phone]   REASON FOR VISIT:  Follow-up for metastatic prostate cancer to the bones and macrocytic anemia  PRIOR THERAPY: None  NGS Results: Not done  CURRENT THERAPY: Lupron injection every 6 months  BRIEF ONCOLOGIC HISTORY:  Oncology History   No history exists.    CANCER STAGING: Cancer Staging No matching staging information was found for the patient.  INTERVAL HISTORY:  Cameron Phelps, a 86 y.o. male, returns for routine follow-up of his metastatic prostate cancer to the bones and macrocytic anemia. Cameron Phelps was last seen on 02/28/2020.   Today he is accompanied by his daughter and he reports feeling well. He denies having hot flashes, though he reports feeling cold often due to his anemia. He has neuropathy on his left leg from the hip to the knee; he took gabapentin but discontinued it after having side effects and is currently taking Lyrica at bedtime, but denies seeing any improvement. He has a bed sore and a home nurse comes once a week to check on the progress. His left hip pain has resolved. He is getting his Epogen injections every 2 weeks at Dr. Toya Smothers.   REVIEW OF SYSTEMS:  Review of Systems  Constitutional: Positive for appetite change (60%) and fatigue (25%).  Respiratory: Positive for cough and shortness of breath.   All other systems reviewed and are negative.   PAST MEDICAL/SURGICAL HISTORY:  Past Medical History:  Diagnosis Date  . Anemia   . Arthritis    gout  . Cancer [facility])    Prostate  . Chronic back pain   . Chronic kidney disease   . Diabetes mellitus without complication ([place])   . Gout   . Hypothyroidism   . Leg edema   . Metabolic bone disease   . Pancreatic cancer ([place])   . PONV (postoperative nausea and vomiting)   . Proteinuria   . Stroke [facility])     residual left -sided weakness   Past Surgical History:  Procedure Laterality Date  . AV FISTULA PLACEMENT Left 04/08/2014   Procedure: ARTERIOVENOUS (AV) FISTULA CREATION;  Surgeon: Conrad Victoria, MD;  Location: [place];  Service: Vascular;  Laterality: Left;  . BASCILIC VEIN TRANSPOSITION Left 07/31/2014   Procedure: SECOND STAGE BASILIC VEIN TRANSPOSITION LEFT ARM;  Surgeon: Conrad Posen, MD;  Location: [place];  Service: Vascular;  Laterality: Left;  . CATARACT EXTRACTION    . COLONOSCOPY W/ BIOPSIES AND POLYPECTOMY    . ESOPHAGOGASTRODUODENOSCOPY (EGD) WITH PROPOFOL N/A 09/24/2016   Procedure: ESOPHAGOGASTRODUODENOSCOPY (EGD) WITH PROPOFOL;  Surgeon: Otis Brace, MD;  Location: [facility];  Service: Gastroenterology;  Laterality: N/A;    SOCIAL HISTORY:  Social History   Socioeconomic History  . Marital status: Widowed    Spouse name: Not on file  . Number of children: 2  . Years of education: Not on file  . Highest education level: Not on file  Occupational History  . Not on file  Tobacco Use  . Smoking status: Never Smoker  . Smokeless tobacco: Never Used  Substance and Sexual Activity  . Alcohol use: No    Alcohol/week: 0.0 standard drinks  . Drug use: No  . Sexual activity: Not on file  Other Topics Concern  . Not on file  Social History Narrative  . Not on file  Social Determinants of Health   Financial Resource Strain: Low Risk   . Difficulty of Paying Living Expenses: Not hard at all  Food Insecurity: No Food Insecurity  . Worried About Charity fundraiser in the Last Year: Never true  . Ran Out of Food in the Last Year: Never true  Transportation Needs: Unmet Transportation Needs  . Lack of Transportation (Medical): Yes  . Lack of Transportation (Non-Medical): Yes  Physical Activity: Inactive  . Days of Exercise per Week: 0 days  . Minutes of Exercise per Session: 0 min  Stress: No Stress Concern Present  . Feeling of Stress : Not at all  Social  Connections: Moderately Isolated  . Frequency of Communication with Friends and Family: Three times a week  . Frequency of Social Gatherings with Friends and Family: More than three times a week  . Attends Religious Services: More than 4 times per year  . Active Member of Clubs or Organizations: No  . Attends Archivist Meetings: Never  . Marital Status: Widowed  Intimate Partner Violence: Not At Risk  . Fear of Current or Ex-Partner: No  . Emotionally Abused: No  . Physically Abused: No  . Sexually Abused: No    FAMILY HISTORY:  Family History  Problem Relation Age of Onset  . Diabetes Mother   . Cancer Daughter   . Diabetes Daughter   . Hyperlipidemia Daughter   . Brain cancer Father   . Diabetes Daughter     CURRENT MEDICATIONS:  Current Outpatient Medications  Medication Sig Dispense Refill  . acetaminophen (TYLENOL) 500 MG tablet Take 500 mg by mouth 2 (two) times daily.    Marland Kitchen allopurinol (ZYLOPRIM) 100 MG tablet Take 100 mg by mouth daily.    Marland Kitchen amitriptyline (ELAVIL) 10 MG tablet Take 10 mg by mouth at bedtime.    . cephALEXin (KEFLEX) 500 MG capsule Take 500 mg by mouth 3 (three) times daily.    Marland Kitchen CINNAMON PO Take by mouth 2 (two) times daily.    . ciprofloxacin (CIPRO) 500 MG tablet Take 500 mg by mouth 2 (two) times daily.    Marland Kitchen docusate sodium (COLACE) 100 MG capsule Take 100 mg by mouth 2 (two) times daily.    Marland Kitchen epoetin alfa-epbx (RETACRIT) 03500 UNIT/ML injection Inject as directed PT RECEIVING 12,000 UNITS Q 2 WEEKS    . lactulose (CHRONULAC) 10 GM/15ML solution Take 20 g by mouth 2 (two) times daily.    Marland Kitchen levothyroxine (SYNTHROID, LEVOTHROID) 88 MCG tablet Take 88 mcg by mouth daily before breakfast.    . pantoprazole (PROTONIX) 40 MG tablet Take 40 mg by mouth daily.    . pioglitazone (ACTOS) 30 MG tablet Take 45 mg by mouth daily.     Marland Kitchen senna (SENOKOT) 8.6 MG tablet Take 1 tablet by mouth 2 (two) times daily.    . sodium bicarbonate 650 MG tablet Take  650 mg by mouth 3 (three) times daily.    Marland Kitchen torsemide (DEMADEX) 20 MG tablet Take 20 mg by mouth daily.    . traMADol (ULTRAM) 50 MG tablet     . ondansetron (ZOFRAN) 4 MG tablet  (Patient not taking: Reported on 06/12/2020)     No current facility-administered medications for this visit.    ALLERGIES:  No Known Allergies  PHYSICAL EXAM:  Performance status (ECOG): 2 - Symptomatic, <50% confined to bed  Vitals:   06/12/20 1455  BP: (!) 90/57  Pulse: 97  Resp: 20  SpO2:  100%   Wt Readings from Last 3 Encounters:  09/24/16 205 lb (93 kg)  09/18/14 208 lb (94.3 kg)  09/06/14 202 lb (91.6 kg)   Physical Exam Vitals reviewed.  Constitutional:      Appearance: Normal appearance.     Comments: In wheelchair  Cardiovascular:     Rate and Rhythm: Normal rate and regular rhythm.     Pulses: Normal pulses.     Heart sounds: Normal heart sounds.  Pulmonary:     Effort: Pulmonary effort is normal.     Breath sounds: Normal breath sounds.  Musculoskeletal:     Right lower leg: No edema.     Left lower leg: No edema.  Neurological:     General: No focal deficit present.     Mental Status: He is alert and oriented to person, place, and time.  Psychiatric:        Mood and Affect: Mood normal.        Behavior: Behavior normal.      LABORATORY DATA:  I have reviewed the labs as listed.  CBC Latest Ref Rng & Units 06/12/2020 02/28/2020 12/13/2019  WBC 4.0 - 10.5 K/uL 5.9 6.7 7.8  Hemoglobin 13.0 - 17.0 g/dL 10.6(L) 9.8(L) 9.3(L)  Hematocrit 39 - 52 % 35.7(L) 33.0(L) 30.9(L)  Platelets 150 - 400 K/uL 202 219 184   CMP Latest Ref Rng & Units 02/28/2020 12/13/2019 09/13/2019  Glucose 70 - 99 mg/dL 225(H) 213(H) 176(H)  BUN 8 - 23 mg/dL 79(H) 76(H) 69(H)  Creatinine 0.61 - 1.24 mg/dL 3.79(H) 5.53(H) 5.23(H)  Sodium 135 - 145 mmol/L 138 138 138  Potassium 3.5 - 5.1 mmol/L 4.1 4.1 4.6  Chloride 98 - 111 mmol/L 96(L) 96(L) 101  CO2 22 - 32 mmol/L 28 26 25   Calcium 8.9 - 10.3 mg/dL 9.5  9.1 9.3  Total Protein 6.5 - 8.1 g/dL 7.7 8.0 7.7  Total Bilirubin 0.3 - 1.2 mg/dL 0.5 0.7 0.6  Alkaline Phos 38 - 126 U/L 59 56 51  AST 15 - 41 U/L 18 23 17   ALT 0 - 44 U/L 8 11 8     DIAGNOSTIC IMAGING:  I have independently reviewed the scans and discussed with the patient. No results found.   ASSESSMENT:  1. Metastatic prostate cancer to the bones: -Presentation with PSA of 861 in July 2016. Started on Eligard every 6 months at [facility]. -Bicalutamide 50 mg discontinued around 03/26/2019 as his PSA was rising. -Bone scan on 05/25/2019 did not show any evidence of metastatic disease. -Lupron 45 mg on 09/15/2019.   PLAN:  1. Metastatic prostate cancer to the bones: -He will continue Lupron every 6 months.  Last PSA was 5.53 and downtrending. -We will follow up on PSA from today. -RTC 3 months with repeat labs.  2. CKD: -Creatinine today is 3.71.  3.  Left hip pain: -We have done x-ray at last visit which was negative for any bony abnormalities.  4. Macrocytic anemia: -He is receiving Epogen injections every other week under the direction of Dr. Theador Hawthorne. -Hemoglobin today is 10.6.   Orders placed this encounter:  Orders Placed This Encounter  Procedures  . CBC with Differential/Platelet  . Comprehensive metabolic panel  . PSA     Derek Jack, MD [place] [phone]   I, Milinda Antis, am acting as a scribe for Dr. Sanda Linger.  I, Derek Jack MD, have reviewed the above documentation for accuracy and completeness, and I agree with the  above.     

## 2020-06-13 ENCOUNTER — Encounter (HOSPITAL_COMMUNITY): Payer: Self-pay | Admitting: *Deleted

## 2020-06-13 DIAGNOSIS — I69354 Hemiplegia and hemiparesis following cerebral infarction affecting left non-dominant side: Secondary | ICD-10-CM | POA: Diagnosis not present

## 2020-06-13 DIAGNOSIS — L89322 Pressure ulcer of left buttock, stage 2: Secondary | ICD-10-CM | POA: Diagnosis not present

## 2020-06-13 DIAGNOSIS — I5030 Unspecified diastolic (congestive) heart failure: Secondary | ICD-10-CM | POA: Diagnosis not present

## 2020-06-13 DIAGNOSIS — N185 Chronic kidney disease, stage 5: Secondary | ICD-10-CM | POA: Diagnosis not present

## 2020-06-13 DIAGNOSIS — E1122 Type 2 diabetes mellitus with diabetic chronic kidney disease: Secondary | ICD-10-CM | POA: Diagnosis not present

## 2020-06-13 DIAGNOSIS — E1140 Type 2 diabetes mellitus with diabetic neuropathy, unspecified: Secondary | ICD-10-CM | POA: Diagnosis not present

## 2020-06-13 DIAGNOSIS — D631 Anemia in chronic kidney disease: Secondary | ICD-10-CM | POA: Diagnosis not present

## 2020-06-13 DIAGNOSIS — N184 Chronic kidney disease, stage 4 (severe): Secondary | ICD-10-CM | POA: Diagnosis not present

## 2020-06-13 NOTE — Progress Notes (Signed)
I spoke with [name], patient's daughter, today and advised of her dad's PSA.  She was thankful for the update and verbalizes understanding of the value.  Nothing further needed at this time.

## 2020-06-16 DIAGNOSIS — I5030 Unspecified diastolic (congestive) heart failure: Secondary | ICD-10-CM | POA: Diagnosis not present

## 2020-06-16 DIAGNOSIS — E1140 Type 2 diabetes mellitus with diabetic neuropathy, unspecified: Secondary | ICD-10-CM | POA: Diagnosis not present

## 2020-06-16 DIAGNOSIS — I69354 Hemiplegia and hemiparesis following cerebral infarction affecting left non-dominant side: Secondary | ICD-10-CM | POA: Diagnosis not present

## 2020-06-16 DIAGNOSIS — N184 Chronic kidney disease, stage 4 (severe): Secondary | ICD-10-CM | POA: Diagnosis not present

## 2020-06-16 DIAGNOSIS — L89322 Pressure ulcer of left buttock, stage 2: Secondary | ICD-10-CM | POA: Diagnosis not present

## 2020-06-16 DIAGNOSIS — E1122 Type 2 diabetes mellitus with diabetic chronic kidney disease: Secondary | ICD-10-CM | POA: Diagnosis not present

## 2020-06-18 DIAGNOSIS — E1140 Type 2 diabetes mellitus with diabetic neuropathy, unspecified: Secondary | ICD-10-CM | POA: Diagnosis not present

## 2020-06-18 DIAGNOSIS — M79672 Pain in left foot: Secondary | ICD-10-CM | POA: Diagnosis not present

## 2020-06-18 DIAGNOSIS — L89893 Pressure ulcer of other site, stage 3: Secondary | ICD-10-CM | POA: Diagnosis not present

## 2020-06-20 DIAGNOSIS — E1140 Type 2 diabetes mellitus with diabetic neuropathy, unspecified: Secondary | ICD-10-CM | POA: Diagnosis not present

## 2020-06-20 DIAGNOSIS — E1122 Type 2 diabetes mellitus with diabetic chronic kidney disease: Secondary | ICD-10-CM | POA: Diagnosis not present

## 2020-06-20 DIAGNOSIS — I5030 Unspecified diastolic (congestive) heart failure: Secondary | ICD-10-CM | POA: Diagnosis not present

## 2020-06-20 DIAGNOSIS — N184 Chronic kidney disease, stage 4 (severe): Secondary | ICD-10-CM | POA: Diagnosis not present

## 2020-06-20 DIAGNOSIS — L89322 Pressure ulcer of left buttock, stage 2: Secondary | ICD-10-CM | POA: Diagnosis not present

## 2020-06-20 DIAGNOSIS — I69354 Hemiplegia and hemiparesis following cerebral infarction affecting left non-dominant side: Secondary | ICD-10-CM | POA: Diagnosis not present

## 2020-06-24 DIAGNOSIS — I5030 Unspecified diastolic (congestive) heart failure: Secondary | ICD-10-CM | POA: Diagnosis not present

## 2020-06-24 DIAGNOSIS — E1122 Type 2 diabetes mellitus with diabetic chronic kidney disease: Secondary | ICD-10-CM | POA: Diagnosis not present

## 2020-06-24 DIAGNOSIS — E1140 Type 2 diabetes mellitus with diabetic neuropathy, unspecified: Secondary | ICD-10-CM | POA: Diagnosis not present

## 2020-06-24 DIAGNOSIS — L89322 Pressure ulcer of left buttock, stage 2: Secondary | ICD-10-CM | POA: Diagnosis not present

## 2020-06-24 DIAGNOSIS — N184 Chronic kidney disease, stage 4 (severe): Secondary | ICD-10-CM | POA: Diagnosis not present

## 2020-06-24 DIAGNOSIS — I69354 Hemiplegia and hemiparesis following cerebral infarction affecting left non-dominant side: Secondary | ICD-10-CM | POA: Diagnosis not present

## 2020-06-26 DIAGNOSIS — I69354 Hemiplegia and hemiparesis following cerebral infarction affecting left non-dominant side: Secondary | ICD-10-CM | POA: Diagnosis not present

## 2020-06-26 DIAGNOSIS — E1140 Type 2 diabetes mellitus with diabetic neuropathy, unspecified: Secondary | ICD-10-CM | POA: Diagnosis not present

## 2020-06-26 DIAGNOSIS — L89322 Pressure ulcer of left buttock, stage 2: Secondary | ICD-10-CM | POA: Diagnosis not present

## 2020-06-26 DIAGNOSIS — E1122 Type 2 diabetes mellitus with diabetic chronic kidney disease: Secondary | ICD-10-CM | POA: Diagnosis not present

## 2020-06-26 DIAGNOSIS — I5030 Unspecified diastolic (congestive) heart failure: Secondary | ICD-10-CM | POA: Diagnosis not present

## 2020-06-26 DIAGNOSIS — N184 Chronic kidney disease, stage 4 (severe): Secondary | ICD-10-CM | POA: Diagnosis not present

## 2020-07-01 DIAGNOSIS — N184 Chronic kidney disease, stage 4 (severe): Secondary | ICD-10-CM | POA: Diagnosis not present

## 2020-07-01 DIAGNOSIS — E1122 Type 2 diabetes mellitus with diabetic chronic kidney disease: Secondary | ICD-10-CM | POA: Diagnosis not present

## 2020-07-01 DIAGNOSIS — I5030 Unspecified diastolic (congestive) heart failure: Secondary | ICD-10-CM | POA: Diagnosis not present

## 2020-07-01 DIAGNOSIS — E1140 Type 2 diabetes mellitus with diabetic neuropathy, unspecified: Secondary | ICD-10-CM | POA: Diagnosis not present

## 2020-07-01 DIAGNOSIS — L89322 Pressure ulcer of left buttock, stage 2: Secondary | ICD-10-CM | POA: Diagnosis not present

## 2020-07-01 DIAGNOSIS — I69354 Hemiplegia and hemiparesis following cerebral infarction affecting left non-dominant side: Secondary | ICD-10-CM | POA: Diagnosis not present

## 2020-07-09 DIAGNOSIS — I69354 Hemiplegia and hemiparesis following cerebral infarction affecting left non-dominant side: Secondary | ICD-10-CM | POA: Diagnosis not present

## 2020-07-09 DIAGNOSIS — L89322 Pressure ulcer of left buttock, stage 2: Secondary | ICD-10-CM | POA: Diagnosis not present

## 2020-07-09 DIAGNOSIS — N184 Chronic kidney disease, stage 4 (severe): Secondary | ICD-10-CM | POA: Diagnosis not present

## 2020-07-09 DIAGNOSIS — E1122 Type 2 diabetes mellitus with diabetic chronic kidney disease: Secondary | ICD-10-CM | POA: Diagnosis not present

## 2020-07-09 DIAGNOSIS — I5030 Unspecified diastolic (congestive) heart failure: Secondary | ICD-10-CM | POA: Diagnosis not present

## 2020-07-09 DIAGNOSIS — E1140 Type 2 diabetes mellitus with diabetic neuropathy, unspecified: Secondary | ICD-10-CM | POA: Diagnosis not present

## 2020-07-10 DIAGNOSIS — N185 Chronic kidney disease, stage 5: Secondary | ICD-10-CM | POA: Diagnosis not present

## 2020-07-10 DIAGNOSIS — E1122 Type 2 diabetes mellitus with diabetic chronic kidney disease: Secondary | ICD-10-CM | POA: Diagnosis not present

## 2020-07-10 DIAGNOSIS — I69354 Hemiplegia and hemiparesis following cerebral infarction affecting left non-dominant side: Secondary | ICD-10-CM | POA: Diagnosis not present

## 2020-07-10 DIAGNOSIS — E1140 Type 2 diabetes mellitus with diabetic neuropathy, unspecified: Secondary | ICD-10-CM | POA: Diagnosis not present

## 2020-07-10 DIAGNOSIS — L89322 Pressure ulcer of left buttock, stage 2: Secondary | ICD-10-CM | POA: Diagnosis not present

## 2020-07-10 DIAGNOSIS — N184 Chronic kidney disease, stage 4 (severe): Secondary | ICD-10-CM | POA: Diagnosis not present

## 2020-07-10 DIAGNOSIS — I5030 Unspecified diastolic (congestive) heart failure: Secondary | ICD-10-CM | POA: Diagnosis not present

## 2020-09-18 ENCOUNTER — Other Ambulatory Visit (HOSPITAL_COMMUNITY): Payer: No Typology Code available for payment source

## 2020-09-18 ENCOUNTER — Ambulatory Visit (HOSPITAL_COMMUNITY): Payer: No Typology Code available for payment source | Admitting: Hematology

## 2020-09-18 ENCOUNTER — Ambulatory Visit (HOSPITAL_COMMUNITY): Payer: No Typology Code available for payment source

## 2020-10-10 DEATH — deceased

## 2020-10-14 ENCOUNTER — Other Ambulatory Visit (HOSPITAL_COMMUNITY): Payer: No Typology Code available for payment source

## 2020-10-14 ENCOUNTER — Ambulatory Visit (HOSPITAL_COMMUNITY): Payer: No Typology Code available for payment source | Admitting: Hematology

## 2020-10-14 ENCOUNTER — Ambulatory Visit (HOSPITAL_COMMUNITY): Payer: No Typology Code available for payment source

## 2020-10-20 ENCOUNTER — Ambulatory Visit (HOSPITAL_COMMUNITY): Payer: No Typology Code available for payment source

## 2020-10-20 ENCOUNTER — Ambulatory Visit (HOSPITAL_COMMUNITY): Payer: No Typology Code available for payment source | Admitting: Hematology and Oncology

## 2020-10-20 ENCOUNTER — Other Ambulatory Visit (HOSPITAL_COMMUNITY): Payer: No Typology Code available for payment source

## 2020-11-11 IMAGING — NM NM BONE WHOLE BODY
4 series · 14 of 14 positions shown · non-contrast
Comparison: CT 08/04/2018.
COMPARISON: CT 08/04/2018.

Addendum:
CLINICAL DATA: History of prostate cancer.

EXAM:
NUCLEAR MEDICINE WHOLE BODY BONE SCAN
TECHNIQUE: Whole body anterior and posterior images were obtained approximately
3 hours after intravenous injection of radiopharmaceutical.
RADIOPHARMACEUTICALS:  20.9 mCi Pechnetium-QQm MDP IV

[Series 1: whole body · 2.66mm/px · 1 of 1 slices shown (1 of 2)]
[im 1/1]
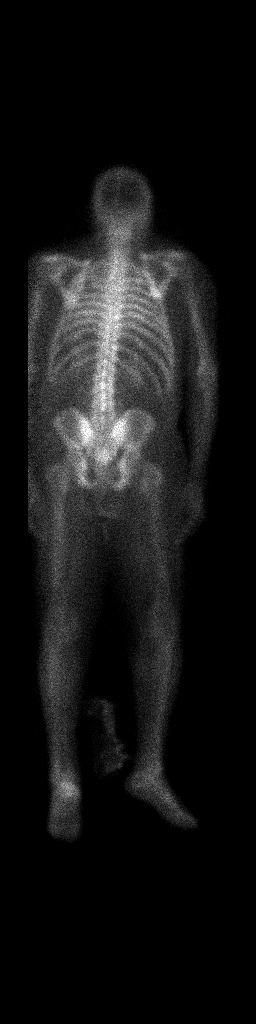

[Series 1: whole body · 2.66mm/px · 1 of 1 slices shown (2 of 2)]
[im 1/1]
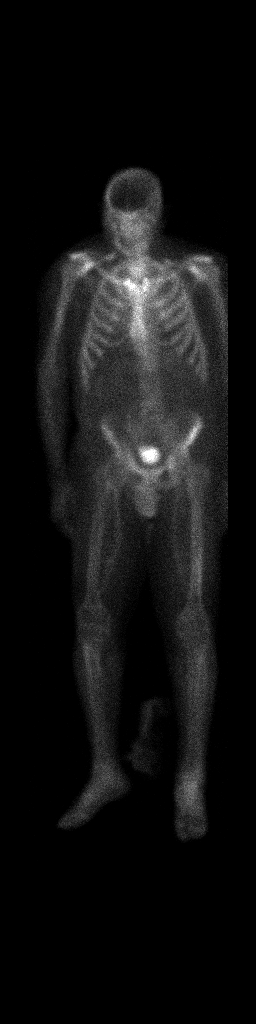

[Series 2: bone spect · 8.28mm/px · 6 of 64 frames shown]
[frame 6/64]
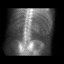
[frame 16/64]
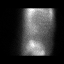
[frame 27/64]
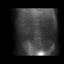
[frame 38/64]
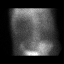
[frame 48/64]
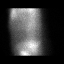
[frame 59/64]
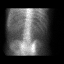

[Series 2: wbr_bone bone spect · 8.3mm · 8.28mm/px · 6 of 64 frames shown]
[frame 6/64]
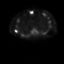
[frame 16/64]
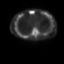
[frame 27/64]
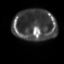
[frame 38/64]
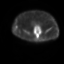
[frame 48/64]
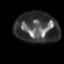
[frame 59/64]
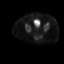

[14 of 14 positions shown; findings below may reference images not displayed]

FINDINGS: Bilateral renal function excretion. Mild increased activity noted
over both shoulders and both sternoclavicular joints, most likely
degenerative. No evidence of metastatic disease.
IMPRESSION: No evidence of metastatic disease.

ADDENDUM:
Whole-body bone SPECT imaging was also obtained and revealed no
focal abnormality.

*** End of Addendum ***
FINDINGS: Bilateral renal function excretion. Mild increased activity noted
over both shoulders and both sternoclavicular joints, most likely
degenerative. No evidence of metastatic disease.
IMPRESSION: No evidence of metastatic disease.

## 2024-02-22 ENCOUNTER — Other Ambulatory Visit: Payer: Self-pay
